# Patient Record
Sex: Male | Born: 1975 | Race: Black or African American | Hispanic: No | Marital: Married | Smoking: Current some day smoker
Health system: Southern US, Community
[De-identification: ages and names within clinical notes are randomized; demographics above are authoritative.]

## PROBLEM LIST (undated history)

## (undated) DIAGNOSIS — N189 Chronic kidney disease, unspecified: Secondary | ICD-10-CM

## (undated) DIAGNOSIS — B20 Human immunodeficiency virus [HIV] disease: Secondary | ICD-10-CM

## (undated) DIAGNOSIS — Z21 Asymptomatic human immunodeficiency virus [HIV] infection status: Secondary | ICD-10-CM

## (undated) DIAGNOSIS — R519 Headache, unspecified: Secondary | ICD-10-CM

## (undated) DIAGNOSIS — I10 Essential (primary) hypertension: Secondary | ICD-10-CM

## (undated) DIAGNOSIS — A539 Syphilis, unspecified: Secondary | ICD-10-CM

## (undated) DIAGNOSIS — H547 Unspecified visual loss: Secondary | ICD-10-CM

## (undated) DIAGNOSIS — R569 Unspecified convulsions: Secondary | ICD-10-CM

## (undated) DIAGNOSIS — Z972 Presence of dental prosthetic device (complete) (partial): Secondary | ICD-10-CM

## (undated) HISTORY — PX: NO PAST SURGERIES: SHX2092

## (undated) HISTORY — PX: MULTIPLE TOOTH EXTRACTIONS: SHX2053

## (undated) HISTORY — DX: Unspecified visual loss: H54.7

---

## 2010-11-06 DIAGNOSIS — B20 Human immunodeficiency virus [HIV] disease: Secondary | ICD-10-CM

## 2010-11-06 DIAGNOSIS — R200 Anesthesia of skin: Secondary | ICD-10-CM

## 2010-11-06 DIAGNOSIS — R202 Paresthesia of skin: Secondary | ICD-10-CM

## 2010-11-06 HISTORY — DX: Human immunodeficiency virus (HIV) disease: B20

## 2010-11-06 HISTORY — DX: Paresthesia of skin: R20.0

## 2011-04-09 DIAGNOSIS — B027 Disseminated zoster: Secondary | ICD-10-CM | POA: Insufficient documentation

## 2011-04-09 HISTORY — DX: Disseminated zoster: B02.7

## 2014-03-02 DIAGNOSIS — R29898 Other symptoms and signs involving the musculoskeletal system: Secondary | ICD-10-CM | POA: Insufficient documentation

## 2014-03-02 HISTORY — DX: Other symptoms and signs involving the musculoskeletal system: R29.898

## 2014-03-19 DIAGNOSIS — R110 Nausea: Secondary | ICD-10-CM | POA: Insufficient documentation

## 2014-06-07 DIAGNOSIS — G540 Brachial plexus disorders: Secondary | ICD-10-CM

## 2014-06-07 HISTORY — DX: Brachial plexus disorders: G54.0

## 2015-06-10 DIAGNOSIS — S0990XA Unspecified injury of head, initial encounter: Secondary | ICD-10-CM

## 2015-06-10 DIAGNOSIS — H9192 Unspecified hearing loss, left ear: Secondary | ICD-10-CM

## 2015-06-10 DIAGNOSIS — R569 Unspecified convulsions: Secondary | ICD-10-CM

## 2015-06-10 HISTORY — DX: Unspecified hearing loss, left ear: H91.92

## 2015-06-10 HISTORY — DX: Unspecified convulsions: R56.9

## 2015-06-10 HISTORY — DX: Unspecified injury of head, initial encounter: S09.90XA

## 2015-12-05 DIAGNOSIS — R062 Wheezing: Secondary | ICD-10-CM | POA: Insufficient documentation

## 2015-12-05 DIAGNOSIS — J392 Other diseases of pharynx: Secondary | ICD-10-CM | POA: Insufficient documentation

## 2017-01-30 ENCOUNTER — Encounter (HOSPITAL_COMMUNITY): Payer: Self-pay

## 2017-01-30 ENCOUNTER — Emergency Department (HOSPITAL_COMMUNITY)
Admission: EM | Admit: 2017-01-30 | Discharge: 2017-01-30 | Disposition: A | Discharge status: Home / Self Care | Payer: Medicaid Other | Attending: Emergency Medicine | Admitting: Emergency Medicine

## 2017-01-30 DIAGNOSIS — B20 Human immunodeficiency virus [HIV] disease: Secondary | ICD-10-CM

## 2017-01-30 DIAGNOSIS — G40909 Epilepsy, unspecified, not intractable, without status epilepticus: Secondary | ICD-10-CM | POA: Diagnosis not present

## 2017-01-30 DIAGNOSIS — R569 Unspecified convulsions: Secondary | ICD-10-CM | POA: Diagnosis present

## 2017-01-30 DIAGNOSIS — F17210 Nicotine dependence, cigarettes, uncomplicated: Secondary | ICD-10-CM | POA: Diagnosis not present

## 2017-01-30 HISTORY — DX: Asymptomatic human immunodeficiency virus (hiv) infection status: Z21

## 2017-01-30 HISTORY — DX: Human immunodeficiency virus (HIV) disease: B20

## 2017-01-30 HISTORY — DX: Unspecified convulsions: R56.9

## 2017-01-30 LAB — VALPROIC ACID LEVEL: VALPROIC ACID LVL: 104 ug/mL — AB (ref 50.0–100.0)

## 2017-01-30 MED ORDER — LACOSAMIDE 100 MG PO TABS: 100.0000 mg | ORAL_TABLET | Freq: Two times a day (BID) | ORAL | 0 refills | Status: DC

## 2017-01-30 MED ORDER — LACOSAMIDE 200 MG PO TABS
200.0000 mg | ORAL_TABLET | Freq: Once | ORAL | Status: DC
  Filled 2017-01-30: qty 1

## 2017-01-30 NOTE — ED Notes (Signed)
Pt refuses gown States "I don't do hospitals or gowns"

## 2017-01-30 NOTE — ED Triage Notes (Signed)
Onset last night pt has had several seizures.  Pt does not remember these seizures.  Pt reports last was several days prior to these seizures and before that it was several weeks ago.  Pt takes Divalproex 500 mg tab 2 tabs in am, 1 tab in afternoon, 2 tabs at HS.  Has not missed any meds.  Pt just moved to [place] from [place].

## 2017-01-30 NOTE — Discharge Instructions (Addendum)
Continue taking your current dose of Depakote.

## 2017-01-30 NOTE — ED Notes (Signed)
Pt very anxious to leave the hospital, not cooperative with care, dc instructions and prescriptions given to the pt, pt refuses to listen to instructions and states he just want to leave, pt oriented that we are only waiting for his medicine to come from pharmacy multiple messages sent to pharmacy medication still not available pt encouraged to wait for prescriptions.

## 2017-01-30 NOTE — ED Provider Notes (Signed)
[facility]EMERGENCY DEPT Provider Note   CSN: [id number] Arrival date & time: 01/30/17  2112     History   Chief Complaint Chief Complaint  Patient presents with  . Seizures    HPI Perry Norton is a 41 y.o. male.  The history is provided by the patient.  Seizures    He has a history of seizure disorder for which he takes Depakote 2500 mg a day. He claims medication compliance. Last night, he had several seizures while in his sleep, and an additional one this afternoon. [place] moved here from [place]. He had called his HIV doctor recommended he come to the ED. He is back to his baseline. He is also followed for HIV disease.  Past Medical History:  Diagnosis Date  . HIV (human immunodeficiency virus infection) (HCC)   . Seizures (HCC)    epilepsy    There are no active problems to display for this patient.   History reviewed. No pertinent surgical history.     Home Medications    Prior to Admission medications   Not on File    Family History History reviewed. No pertinent family history.  Social History Social History  Substance Use Topics  . Smoking status: Current Some Day Smoker    Types: Cigarettes  . Smokeless tobacco: Never Used  . Alcohol use Yes     Comment: occ     Allergies   Patient has no known allergies.   Review of Systems Review of Systems  Neurological: Positive for seizures.  All other systems reviewed and are negative.    Physical Exam Updated Vital Signs BP (!) 142/103   Pulse 93   Temp 98.5 F (36.9 C) (Oral)   Resp 16   SpO2 100%   Physical Exam  Nursing note and vitals reviewed.  41 year old male, resting comfortably and in no acute distress. Vital signs are significant for hypertension. Oxygen saturation is 100%, which is normal. Head is normocephalic and atraumatic. PERRLA, EOMI. Oropharynx is clear. Neck is nontender and supple without adenopathy or JVD. Back is nontender and there is no CVA tenderness. Lungs are  clear without rales, wheezes, or rhonchi. Chest is nontender. Heart has regular rate and rhythm without murmur. Abdomen is soft, flat, nontender without masses or hepatosplenomegaly and peristalsis is normoactive. Extremities have no cyanosis or edema, full range of motion is present. Skin is warm and dry without rash. Neurologic: Mental status is normal, cranial nerves are intact, there are no motor or sensory deficits.  ED Treatments / Results  Labs (all labs ordered are listed, but only abnormal results are displayed) Labs Reviewed  VALPROIC ACID LEVEL - Abnormal; Notable for the following:       Result Value   Valproic Acid Lvl 104 (*)    All other components within normal limits   Procedures Procedures (including critical care time)  Medications Ordered in ED Medications  lacosamide (VIMPAT) tablet 200 mg (not administered)     Initial Impression / Assessment and Plan / ED Course  I have reviewed the triage vital signs and the nursing notes.  Pertinent lab results that were available during my care of the patient were reviewed by me and considered in my medical decision making (see chart for details).  Seizures with therapeutic anticonvulsant level. I have access to his records from [place] through care everywhere. His valproic acid dose had been gradually increased. Level today is very slightly supratherapeutic. He had been on levetiracetam, but it was  discontinued because of behavioral side effects. Last HIV studies in March had shown nondetectable viral load and normal CD4 count. I do not see an indication for imaging tonight. I have discussed the case with Dr. Wilford Corner of neurology service who recommends starting him on Vimpat. Initial doses given in the emergency department, and he is sent home with a prescription for same. He is given resources to establish care for neurology and infectious disease.  Final Clinical Impressions(s) / ED Diagnoses   Final diagnoses:  Seizure  (HCC)  HIV disease (HCC)    New Prescriptions New Prescriptions   LACOSAMIDE (VIMPAT) 100 MG TABS    Take 1 tablet (100 mg total) by mouth 2 (two) times daily.     Dione Booze, MD 01/30/17 [phone]

## 2017-02-01 ENCOUNTER — Telehealth: Payer: Self-pay | Admitting: Infectious Disease

## 2017-02-01 NOTE — Telephone Encounter (Signed)
Patient was seen in the ER for seizures. He has known HIV that has been well-controlled. He is however seeking to establish care here in [place] and is highly motivated. I was called by the ER staff regarding this patient and told them that I would 40 is information to our clinical staff so we get him into our clinic as we can.

## 2017-04-28 DIAGNOSIS — M542 Cervicalgia: Secondary | ICD-10-CM | POA: Diagnosis not present

## 2017-04-28 DIAGNOSIS — Z0001 Encounter for general adult medical examination with abnormal findings: Secondary | ICD-10-CM | POA: Diagnosis not present

## 2017-04-28 DIAGNOSIS — B20 Human immunodeficiency virus [HIV] disease: Secondary | ICD-10-CM | POA: Diagnosis not present

## 2017-04-28 DIAGNOSIS — G40909 Epilepsy, unspecified, not intractable, without status epilepticus: Secondary | ICD-10-CM | POA: Diagnosis not present

## 2017-05-03 ENCOUNTER — Emergency Department (HOSPITAL_COMMUNITY): Payer: Medicaid Other

## 2017-05-03 ENCOUNTER — Emergency Department (HOSPITAL_COMMUNITY)
Admission: EM | Admit: 2017-05-03 | Discharge: 2017-05-03 | Disposition: A | Discharge status: Home / Self Care | Payer: Medicaid Other | Attending: Emergency Medicine | Admitting: Emergency Medicine

## 2017-05-03 ENCOUNTER — Encounter (HOSPITAL_COMMUNITY): Payer: Self-pay | Admitting: *Deleted

## 2017-05-03 ENCOUNTER — Other Ambulatory Visit: Payer: Self-pay

## 2017-05-03 DIAGNOSIS — S43102A Unspecified dislocation of left acromioclavicular joint, initial encounter: Secondary | ICD-10-CM | POA: Insufficient documentation

## 2017-05-03 DIAGNOSIS — W06XXXA Fall from bed, initial encounter: Secondary | ICD-10-CM | POA: Insufficient documentation

## 2017-05-03 DIAGNOSIS — F17210 Nicotine dependence, cigarettes, uncomplicated: Secondary | ICD-10-CM | POA: Diagnosis not present

## 2017-05-03 DIAGNOSIS — Y939 Activity, unspecified: Secondary | ICD-10-CM | POA: Diagnosis not present

## 2017-05-03 DIAGNOSIS — R569 Unspecified convulsions: Secondary | ICD-10-CM | POA: Insufficient documentation

## 2017-05-03 DIAGNOSIS — Y999 Unspecified external cause status: Secondary | ICD-10-CM | POA: Insufficient documentation

## 2017-05-03 DIAGNOSIS — B20 Human immunodeficiency virus [HIV] disease: Secondary | ICD-10-CM | POA: Insufficient documentation

## 2017-05-03 DIAGNOSIS — Y929 Unspecified place or not applicable: Secondary | ICD-10-CM | POA: Diagnosis not present

## 2017-05-03 LAB — CBC WITH DIFFERENTIAL/PLATELET
BASOS PCT: 1 %
Basophils Absolute: 0 10*3/uL (ref 0.0–0.1)
EOS ABS: 0.1 10*3/uL (ref 0.0–0.7)
Eosinophils Relative: 1 %
HEMATOCRIT: 43.3 % (ref 39.0–52.0)
Hemoglobin: 14.4 g/dL (ref 13.0–17.0)
LYMPHS ABS: 2.1 10*3/uL (ref 0.7–4.0)
Lymphocytes Relative: 41 %
MCH: 29.8 pg (ref 26.0–34.0)
MCHC: 33.3 g/dL (ref 30.0–36.0)
MCV: 89.6 fL (ref 78.0–100.0)
MONO ABS: 0.4 10*3/uL (ref 0.1–1.0)
MONOS PCT: 8 %
Neutro Abs: 2.5 10*3/uL (ref 1.7–7.7)
Neutrophils Relative %: 49 %
Platelets: 185 10*3/uL (ref 150–400)
RBC: 4.83 MIL/uL (ref 4.22–5.81)
RDW: 12.8 % (ref 11.5–15.5)
WBC: 5.2 10*3/uL (ref 4.0–10.5)

## 2017-05-03 LAB — COMPREHENSIVE METABOLIC PANEL
ALBUMIN: 4.4 g/dL (ref 3.5–5.0)
ALT: 18 U/L (ref 17–63)
ANION GAP: 8 (ref 5–15)
AST: 20 U/L (ref 15–41)
Alkaline Phosphatase: 66 U/L (ref 38–126)
BILIRUBIN TOTAL: 0.5 mg/dL (ref 0.3–1.2)
BUN: 10 mg/dL (ref 6–20)
CO2: 28 mmol/L (ref 22–32)
Calcium: 9.3 mg/dL (ref 8.9–10.3)
Chloride: 103 mmol/L (ref 101–111)
Creatinine, Ser: 1.43 mg/dL — ABNORMAL HIGH (ref 0.61–1.24)
GFR calc non Af Amer: >60 mL/min (ref >60)
GLUCOSE: 118 mg/dL — AB (ref 65–99)
POTASSIUM: 3.8 mmol/L (ref 3.5–5.1)
SODIUM: 139 mmol/L (ref 135–145)
TOTAL PROTEIN: 7.4 g/dL (ref 6.5–8.1)

## 2017-05-03 LAB — VALPROIC ACID LEVEL: Valproic Acid Lvl: <10 ug/mL — ABNORMAL LOW (ref 50.0–100.0)

## 2017-05-03 MED ORDER — HYDROCODONE-ACETAMINOPHEN 5-325 MG PO TABS
1.0000 | ORAL_TABLET | Freq: Once | ORAL | Status: AC
  Administered 2017-05-03: 1 via ORAL
  Filled 2017-05-03: qty 1

## 2017-05-03 MED ORDER — DIVALPROEX SODIUM 250 MG PO DR TAB
1000.0000 mg | DELAYED_RELEASE_TABLET | Freq: Once | ORAL | Status: AC
  Administered 2017-05-03: 1000 mg via ORAL
  Filled 2017-05-03: qty 4

## 2017-05-03 NOTE — Progress Notes (Signed)
Orthopedic Tech Progress Note Patient Details:  Perry Norton [birth year] [id number]  Ortho Devices Type of Ortho Device: Sling immobilizer Ortho Device/Splint Interventions: Application   Saul FordyceJennifer C Ellie Bryand 05/03/2017, 5:19 PM

## 2017-05-03 NOTE — ED Provider Notes (Signed)
[facility] Provider Note   CSN: [id number] Arrival date & time: 05/03/17  1416     History   Chief Complaint Chief Complaint  Patient presents with  . Seizures    HPI Perry Norton is a 41 y.o. male.  41 year old male with history of seizure disorder and HIV presents complaining of left shoulder pain following seizures. Patient appears alert and comfortable at time of my evaluation. He reports that he fell off his bed (about 3 feet) onto the floor. He landed on his left shoulder. He complains of left shoulder pain, left elbow pain, and left wrist pain. He denies head injury.  He reports that he has not taken his depakote for several weeks (he has never taken vimpat). He reports that he has depakote at the pharmacy - he just has to "pick it up."   He declines IV, IM meds, and/or CT head. He is alert and comfortable and has capacity to refuse care.    The history is provided by the patient.  Seizures   This is a recurrent problem. The current episode started 6 to 12 hours ago. The problem has been gradually improving. There were 6 to 10 seizures. Duration: uncertain. uncertain The episode was not witnessed. There was no sensation of an aura present. Possible causes include missed seizure meds. There has been no fever.    Past Medical History:  Diagnosis Date  . HIV (human immunodeficiency virus infection) (HCC)   . Seizures (HCC)    epilepsy    There are no active problems to display for this patient.   History reviewed. No pertinent surgical history.     Home Medications    Prior to Admission medications   Medication Sig Start Date End Date Taking? Authorizing Provider  Lacosamide (VIMPAT) 100 MG TABS Take 1 tablet (100 mg total) by mouth 2 (two) times daily. Patient not taking: Reported on 05/03/2017 01/30/17   Dione BoozeGlick, David, MD    Family History No family history on file.  Social History Social History   Tobacco Use  .  Smoking status: Current Some Day Smoker    Types: Cigarettes  . Smokeless tobacco: Never Used  Substance Use Topics  . Alcohol use: Yes    Comment: occ  . Drug use: Yes    Types: Marijuana    Comment: denies use 05/03/17     Allergies   Onion   Review of Systems Review of Systems  Musculoskeletal:       Left shoulder pain   Neurological: Positive for seizures.  All other systems reviewed and are negative.    Physical Exam Updated Vital Signs BP (!) 155/80 (BP Location: Right Arm)   Pulse 75   Temp 98.4 F (36.9 C) (Oral)   Resp 14   Ht 6' (1.829 m)   Wt 80.7 kg (178 lb)   SpO2 100%   BMI 24.14 kg/m   Physical Exam  Constitutional: He appears well-developed and well-nourished.  HENT:  Head: Normocephalic and atraumatic.  Mouth/Throat: Oropharynx is clear and moist.  Eyes: Conjunctivae and EOM are normal. Pupils are equal, round, and reactive to light.  Neck: Normal range of motion. Neck supple.  Cardiovascular: Normal rate, regular rhythm and normal heart sounds.  No murmur heard. Pulmonary/Chest: Effort normal and breath sounds normal. No respiratory distress.  Abdominal: Soft. Bowel sounds are normal. He exhibits no distension. There is no tenderness.  Musculoskeletal: Normal range of motion. He exhibits no edema.  Mild tenderness to left shoulder, left elbow, and left wrist - no appreciable deformity, swelling, or contusion  Neurological: He is alert.  Skin: Skin is warm and dry.  Psychiatric: He has a normal mood and affect.  Nursing note and vitals reviewed.    ED Treatments / Results  Labs (all labs ordered are listed, but only abnormal results are displayed) Labs Reviewed  VALPROIC ACID LEVEL - Abnormal; Notable for the following components:      Result Value   Valproic Acid Lvl <10 (*)    All other components within normal limits  COMPREHENSIVE METABOLIC PANEL - Abnormal; Notable for the following components:   Glucose, Bld 118 (*)     Creatinine, Ser 1.43 (*)    All other components within normal limits  CBC WITH DIFFERENTIAL/PLATELET    EKG  EKG Interpretation None       Radiology Dg Shoulder Left  Result Date: 05/03/2017 CLINICAL DATA:  History of seizures, unwitnessed seizure, minimally responsive. LEFT shoulder pain. EXAM: LEFT SHOULDER - 2+ VIEW COMPARISON:  None. FINDINGS: There is a moderate AC separation, possible grade 2 or 3. There is no glenohumeral fracture or dislocation. The clavicle is incompletely visualized, but appears grossly intact. IMPRESSION: Moderate AC separation, possible grade 2 or 3. Electronically Signed   By: Elsie StainJohn T Curnes M.D.   On: 05/03/2017 15:23    Procedures Procedures (including critical care time)  Medications Ordered in ED Medications  HYDROcodone-acetaminophen (NORCO/VICODIN) 5-325 MG per tablet 1 tablet (not administered)  divalproex (DEPAKOTE) DR tablet 1,000 mg (not administered)     Initial Impression / Assessment and Plan / ED Course  I have reviewed the triage vital signs and the nursing notes.  Pertinent labs & imaging results that were available during my care of the patient were reviewed by me and considered in my medical decision making (see chart for details).     MSE complete.  Patient with probable seizure secondary to noncompliance.  Patient appears quite comfortable during my evaluation.  There is no evidence of significant traumatic injury.  The patient was somewhat uncooperative with a more thorough workup in the ED.  Patient is aware of the need for close follow-up.  Patient reports to me that he will go and get his prescription for Depakote picked up from the pharmacy today.  Close follow-up is advised.  Strict return precautions are given and understood.  Final Clinical Impressions(s) / ED Diagnoses   Final diagnoses:  Seizure [facility](HCC)  Separation of left acromioclavicular joint, initial encounter    ED Discharge Orders    None         Wynetta FinesMessick, Peter C, MD 05/03/17 1745

## 2017-05-03 NOTE — ED Triage Notes (Addendum)
Pt in c/o hx of seizures with more than 6 in the last 24 hours, pt reports having an unwitnessed seizure today while laying in bed and fell out of bed, pt here now c/o L shoulder pain, pt states, "I feel like the bone is sticking up." A&O x4, ambulatory, pt guarding L arm

## 2017-05-03 NOTE — ED Notes (Signed)
Patient transported to X-ray 

## 2017-05-03 NOTE — ED Notes (Signed)
Pt. Is refusing to be placed in a wheel chair to go to x-ray. He stated he was not sitting in a wheel chair and was walking. Pt advised other wise but refused.

## 2017-05-03 NOTE — ED Notes (Addendum)
Pt is refusing to be hooked to any monitors and refusing to lay in the bed

## 2017-05-04 ENCOUNTER — Telehealth: Payer: Self-pay

## 2017-05-04 NOTE — Telephone Encounter (Signed)
Patient is calling to establish with [facility].  He is a new transfer and has been seen at [facility] where he was given a 30 day supply of Stribild.   He was advised to come to the office to sign a medical record release , given lab appointment and office visit.   Laurell Josephsammy K. King, RN

## 2017-05-05 ENCOUNTER — Encounter: Payer: Self-pay | Admitting: Neurology

## 2017-05-12 ENCOUNTER — Ambulatory Visit: Payer: PRIVATE HEALTH INSURANCE

## 2017-05-12 ENCOUNTER — Telehealth: Payer: Self-pay

## 2017-05-12 ENCOUNTER — Other Ambulatory Visit: Payer: Medicaid Other

## 2017-05-12 ENCOUNTER — Other Ambulatory Visit (HOSPITAL_COMMUNITY)
Admission: RE | Admit: 2017-05-12 | Discharge: 2017-05-12 | Disposition: A | Discharge status: Home / Self Care | Payer: PRIVATE HEALTH INSURANCE | Source: Ambulatory Visit | Attending: Infectious Diseases | Admitting: Infectious Diseases

## 2017-05-12 ENCOUNTER — Encounter: Payer: Self-pay | Admitting: *Deleted

## 2017-05-12 DIAGNOSIS — Z113 Encounter for screening for infections with a predominantly sexual mode of transmission: Secondary | ICD-10-CM | POA: Insufficient documentation

## 2017-05-12 DIAGNOSIS — B20 Human immunodeficiency virus [HIV] disease: Secondary | ICD-10-CM

## 2017-05-12 LAB — CBC WITH DIFFERENTIAL/PLATELET
BASOS ABS: 39 {cells}/uL (ref 0–200)
Basophils Relative: 0.7 %
EOS PCT: 1.4 %
Eosinophils Absolute: 78 cells/uL (ref 15–500)
HEMATOCRIT: 40.4 % (ref 38.5–50.0)
HEMOGLOBIN: 13.6 g/dL (ref 13.2–17.1)
LYMPHS ABS: 2078 {cells}/uL (ref 850–3900)
MCH: 29.7 pg (ref 27.0–33.0)
MCHC: 33.7 g/dL (ref 32.0–36.0)
MCV: 88.2 fL (ref 80.0–100.0)
MPV: 11.7 fL (ref 7.5–12.5)
Monocytes Relative: 8.9 %
NEUTROS ABS: 2906 {cells}/uL (ref 1500–7800)
Neutrophils Relative %: 51.9 %
Platelets: 180 10*3/uL (ref 140–400)
RBC: 4.58 10*6/uL (ref 4.20–5.80)
RDW: 12.7 % (ref 11.0–15.0)
Total Lymphocyte: 37.1 %
WBC: 5.6 10*3/uL (ref 3.8–10.8)
WBCMIX: 498 {cells}/uL (ref 200–950)

## 2017-05-12 LAB — COMPLETE METABOLIC PANEL WITH GFR
AG Ratio: 1.7 (calc) (ref 1.0–2.5)
ALBUMIN MSPROF: 4.3 g/dL (ref 3.6–5.1)
ALT: 25 U/L (ref 9–46)
AST: 25 U/L (ref 10–40)
Alkaline phosphatase (APISO): 58 U/L (ref 40–115)
BILIRUBIN TOTAL: 0.4 mg/dL (ref 0.2–1.2)
BUN/Creatinine Ratio: 5 (calc) — ABNORMAL LOW (ref 6–22)
BUN: 8 mg/dL (ref 7–25)
CHLORIDE: 103 mmol/L (ref 98–110)
CO2: 30 mmol/L (ref 20–32)
CREATININE: 1.52 mg/dL — AB (ref 0.60–1.35)
Calcium: 9.2 mg/dL (ref 8.6–10.3)
GFR, EST AFRICAN AMERICAN: 66 mL/min/{1.73_m2} (ref >=60)
GFR, Est Non African American: 57 mL/min/{1.73_m2} — ABNORMAL LOW (ref >=60)
GLUCOSE: 96 mg/dL (ref 65–99)
Globulin: 2.5 g/dL (calc) (ref 1.9–3.7)
Potassium: 4.3 mmol/L (ref 3.5–5.3)
Sodium: 140 mmol/L (ref 135–146)
TOTAL PROTEIN: 6.8 g/dL (ref 6.1–8.1)

## 2017-05-12 NOTE — Telephone Encounter (Signed)
Patient here for labs prior to New transfer HIV appointment.   Phlebotomist mention to me he was acting strange. I went out to introduce myself to the patient and make sure his intake process was going smoothly.  He was texting on his phone and did not bother to look up when I introduced myself.  I continued to speak with him and finally asked if he was ok.  When he lifted his head he had a strange stare and seem a little disoriented.  I asked if he was ok and he spoke with a slurred voice he was fine. I asked if he was sure and if there was anything I could do.  He stated in a rude tone" I am fine".  I continued to explain the process and he never lifted his head or acknowledged I was speaking.  I was still concerned about his slurred speech and his rude or awkward demeanor. I asked the scheduler and the financial counsel about their interactions with him and they stated he was fine.   Still concerned I check his medical records and noted he was diagnosed with seizure disorder and my concern grew larger.  I attempted to engage with the patient again and he became irate.   After his blood draw he was leaving the lab and fell to his knees at the exit. The financial counselor ran to find me and I attempted to reach for the patient and he flung his arms out as not to touch him.  He stood up and was very disoriented and his gait was off.  I asked to help him and he said no shaking his head.  He walked into the waiting area and I asked that he at least sit down for a few minutes until he was no longer dizzy and he shook his head no and left the office.  I followed him because I was sure he would fall again, he struggled to walk straight and stumbled through the parking deck. He continued to look back to see if I was still following him.  He disappeared in the parking deck. I am not sure if he was driving or walking.   Laurell Josephsammy K King, RN

## 2017-05-14 LAB — RPR TITER

## 2017-05-14 LAB — HEPATITIS C ANTIBODY
Hepatitis C Ab: NONREACTIVE
SIGNAL TO CUT-OFF: 0.02 (ref <1.00)

## 2017-05-14 LAB — RPR: RPR: REACTIVE — AB

## 2017-05-14 LAB — LIPID PANEL
Cholesterol: 197 mg/dL (ref <200)
HDL: 56 mg/dL (ref >40)
LDL Cholesterol (Calc): 119 mg/dL (calc) — ABNORMAL HIGH
Non-HDL Cholesterol (Calc): 141 mg/dL (calc) — ABNORMAL HIGH (ref <130)
TRIGLYCERIDES: 112 mg/dL (ref <150)
Total CHOL/HDL Ratio: 3.5 (calc) (ref <5.0)

## 2017-05-14 LAB — HEPATITIS B SURFACE ANTIBODY,QUALITATIVE: HEP B S AB: NONREACTIVE

## 2017-05-14 LAB — T-HELPER CELL (CD4) - (RCID CLINIC ONLY)
CD4 % Helper T Cell: 24 % — ABNORMAL LOW (ref 33–55)
CD4 T Cell Abs: 520 /uL (ref 400–2700)

## 2017-05-14 LAB — HEPATITIS B CORE ANTIBODY, TOTAL: Hep B Core Total Ab: NONREACTIVE

## 2017-05-14 LAB — URINE CYTOLOGY ANCILLARY ONLY
Chlamydia: NEGATIVE
Neisseria Gonorrhea: NEGATIVE

## 2017-05-14 LAB — HEPATITIS B SURFACE ANTIGEN: HEP B S AG: NONREACTIVE

## 2017-05-14 LAB — FLUORESCENT TREPONEMAL AB(FTA)-IGG-BLD: FLUORESCENT TREPONEMAL ABS: REACTIVE — AB

## 2017-05-14 LAB — HEPATITIS A ANTIBODY, TOTAL: HEPATITIS A AB,TOTAL: NONREACTIVE

## 2017-05-16 LAB — HIV-1 RNA ULTRAQUANT REFLEX TO GENTYP+: HIV-1 RNA Quant, Log: <1.3 Log cps/mL — ABNORMAL HIGH

## 2017-05-17 ENCOUNTER — Other Ambulatory Visit: Payer: Self-pay | Admitting: *Deleted

## 2017-05-17 ENCOUNTER — Telehealth: Payer: Self-pay | Admitting: *Deleted

## 2017-05-17 ENCOUNTER — Ambulatory Visit: Payer: Medicaid Other | Attending: Internal Medicine | Admitting: Physician Assistant

## 2017-05-17 ENCOUNTER — Other Ambulatory Visit: Payer: Self-pay

## 2017-05-17 VITALS — BP 167/110 | Temp 98.7°F | Resp 12 | Ht 72.0 in | Wt 189.0 lb

## 2017-05-17 DIAGNOSIS — B20 Human immunodeficiency virus [HIV] disease: Secondary | ICD-10-CM

## 2017-05-17 DIAGNOSIS — I10 Essential (primary) hypertension: Secondary | ICD-10-CM | POA: Insufficient documentation

## 2017-05-17 DIAGNOSIS — G40909 Epilepsy, unspecified, not intractable, without status epilepticus: Secondary | ICD-10-CM | POA: Insufficient documentation

## 2017-05-17 DIAGNOSIS — F17200 Nicotine dependence, unspecified, uncomplicated: Secondary | ICD-10-CM | POA: Diagnosis not present

## 2017-05-17 DIAGNOSIS — X58XXXA Exposure to other specified factors, initial encounter: Secondary | ICD-10-CM | POA: Diagnosis not present

## 2017-05-17 DIAGNOSIS — S43102A Unspecified dislocation of left acromioclavicular joint, initial encounter: Secondary | ICD-10-CM

## 2017-05-17 DIAGNOSIS — Z21 Asymptomatic human immunodeficiency virus [HIV] infection status: Secondary | ICD-10-CM | POA: Insufficient documentation

## 2017-05-17 DIAGNOSIS — R030 Elevated blood-pressure reading, without diagnosis of hypertension: Secondary | ICD-10-CM

## 2017-05-17 DIAGNOSIS — Z5329 Procedure and treatment not carried out because of patient's decision for other reasons: Secondary | ICD-10-CM | POA: Diagnosis not present

## 2017-05-17 DIAGNOSIS — R569 Unspecified convulsions: Secondary | ICD-10-CM

## 2017-05-17 DIAGNOSIS — Z79899 Other long term (current) drug therapy: Secondary | ICD-10-CM | POA: Diagnosis not present

## 2017-05-17 DIAGNOSIS — A539 Syphilis, unspecified: Secondary | ICD-10-CM

## 2017-05-17 MED ORDER — EMTRICITABINE-TENOFOVIR DF 200-300 MG PO TABS: 1.0000 | ORAL_TABLET | Freq: Every day | ORAL | 1 refills | Status: DC

## 2017-05-17 MED ORDER — HYDROCODONE-ACETAMINOPHEN 10-325 MG PO TABS: 1.0000 | ORAL_TABLET | Freq: Three times a day (TID) | ORAL | 0 refills | Status: DC | PRN

## 2017-05-17 MED ORDER — DOXYCYCLINE HYCLATE 100 MG PO TABS: 100.0000 mg | ORAL_TABLET | Freq: Two times a day (BID) | ORAL | 0 refills | Status: DC

## 2017-05-17 MED ORDER — DIVALPROEX SODIUM 500 MG PO DR TAB: 1250.0000 mg | DELAYED_RELEASE_TABLET | Freq: Three times a day (TID) | ORAL | 1 refills | Status: DC

## 2017-05-17 MED ORDER — LACOSAMIDE 200 MG PO TABS: 200.0000 mg | ORAL_TABLET | Freq: Two times a day (BID) | ORAL | 1 refills | Status: DC

## 2017-05-17 NOTE — Telephone Encounter (Signed)
Positive syphilis bicillin x 2 per Dr. Ninetta LightsHatcher. Called and left patient a generic voice mail to return the call. Wendall MolaJacqueline Cockerham CMA

## 2017-05-17 NOTE — Progress Notes (Signed)
Per verbal order from Dr Ninetta LightsHatcher. Andree CossHowell, Bralin Garry M, RN

## 2017-05-17 NOTE — Progress Notes (Signed)
Pt here for f/u hospital visit.

## 2017-05-17 NOTE — Telephone Encounter (Signed)
Patient returning clinic call regarding lab results. He was frustrated, stating he has called several times over the last 2+ hours, keeps being put on long holds for [place], but cannot wait on hold.  RN apologized for the delay, asked how I could help.  RN relayed test results, asked patient if he has been recently treated for syphilis. He stated he had 3 rounds of injections years ago, has tested negative since then. RN relayed the message that Dr Ninetta LightsHatcher is advising 1 round of injections, 2.4 million units bicillin.  Patient requested to have pills instead, stating that his physician in [place] used to just give him that. RN advised that I would have to ask Dr Ninetta LightsHatcher, would call him back with the reply.  RN confirmed patient's upcoming appointment on 11/28 2:00 Wednesday.  Of note, [facility] saw [facility] to establish care. He told them he was out of HIV medication, so they sent in Tru[facility]ada alone to [facility] on [address].  Per pharmacy, he last got Stribild on 11/7, but has not yet filled Truvada.  RN asked the pharmacy to hold Truvada until Dr Ninetta LightsHatcher reviews this. Andree CossHowell, Lynnette Pote M, RN

## 2017-05-17 NOTE — Telephone Encounter (Signed)
If pt's hx of negative test in last 12 months is correct, we can give him tx as early.  Would give him PEN G 2.4 million units once. If he refuses, doxy 100mg  po bid for 14 days. This was checked via [facility] drug interaction with his other rx.

## 2017-05-17 NOTE — Progress Notes (Signed)
Perry Gingerric Kavanaugh  EAV:[id number]  NWG:[id number]  DOB - 03-21-1976  Chief Complaint  Patient presents with  . Follow-up    ED       Subjective:   Perry Norton is a 41 y.o. male here today for establishment of care. He has a history of smoking, syphilis, substance abuse, HIV virus and seizure disorder. He relatively recently moved here [place]. He presented to the emergency department on 05/03/2017 with left shoulder pain. It sounds like he had been off his seizure medications for weeks and may have had a seizure and fell off the bed. He landed on his left shoulder. His valproic acid level in the emergency department was less than 10. His creatinine was 1.4. He did allow and x-ray of the left shoulder which showed a moderate before meals separation, grade 2 or 3. He was placed in a sling. He would not allow for any seizure or pain medications in the emergency department. The ED doc recommended Vimpat with the Depakote upon DC.  He is pretty uncooperative today is well. His blood pressure is very high and I offered treatment with medications and to recheck his blood pressure manually which he declines. He would not shake my hand during the interview. He would not allow me to do a physical examination. He would talk to me and states that he's not had any further seizures. He denies chest pain or shortness of breath. He does have pain in the left shoulder and is requesting some medications if we have some. He is ok seeing a specialists about his shoulder.  He needs refills on SZ meds, HIV meds and would like a pain pill.    ROS: GEN: denies fever or chills, denies change in weight Skin: denies lesions or rashes HEENT: denies headache, earache, epistaxis, sore throat, or neck pain LUNGS: denies SHOB, dyspnea, PND, orthopnea EXT: denies muscle spasms or swelling; + pain in upper left ext, + weakness NEURO: denies numbness or tingling, denies sz, stroke or  TIA   ALLERGIES: Allergies  Allergen Reactions  . Onion Rash    PAST MEDICAL HISTORY: Past Medical History:  Diagnosis Date  . HIV (human immunodeficiency virus infection) (HCC)   . Seizures (HCC)    epilepsy    PAST SURGICAL HISTORY: No past surgical history on file.  MEDICATIONS AT HOME: Prior to Admission medications   Medication Sig Start Date End Date Taking? Authorizing Provider  divalproex (DEPAKOTE) 500 MG DR tablet Take 3 tablets (1,500 mg total) by mouth 3 (three) times daily. 05/17/17  Yes Sheena Simonis S, PA-C  emtricitabine-tenofovir (TRUVADA) 200-300 MG tablet Take 1 tablet by mouth daily. 05/17/17  Yes Danelle EarthlyNoel, [place] S, PA-C  lacosamide (VIMPAT) 200 MG TABS tablet Take 1 tablet (200 mg total) by mouth 2 (two) times daily. 05/17/17  Yes Danelle EarthlyNoel, Makeya Hilgert S, PA-C  HYDROcodone-acetaminophen (NORCO) 10-325 MG tablet Take 1 tablet by mouth every 8 (eight) hours as needed. 05/17/17   Vivianne MasterNoel, Lalena Salas S, PA-C  Lacosamide (VIMPAT) 100 MG TABS Take 1 tablet (100 mg total) by mouth 2 (two) times daily. Patient not taking: Reported on 05/03/2017 01/30/17   Dione BoozeGlick, David, MD   Family-he would not discuss  Social-married, children, recently moved [place] from [place]  Objective:   Vitals:   05/17/17 1009  BP: (!) 167/110  Resp: 12  Temp: 98.7 F (37.1 C)  TempSrc: Oral  SpO2: 99%  Weight: 189 lb (85.7 kg)  Height: 6' (1.829 m)  Exam General appearance : Awake, alert, not in any distress. Speech Clear. Not toxic looking HEENT: Atraumatic and Normocephalic, pupils equally reactive to light and accomodation Neck: supple, no JVD. No cervical lymphadenopathy.  Chest:Good air entry bilaterally, no added sounds  CVS: S1 S2 regular, no murmurs.  Abdomen: Bowel sounds present, Non tender and not distended with no guarding, rigidity or rebound. Extremities: B/L Lower Ext shows no edema, both legs are warm to touch Neurology: Awake alert, and oriented X 3, CN II-XII intact, Non  focal Skin:No Rash Wounds:N/A   Assessment & Plan  1. Seizure D/O  -refilled Vimpat and Depakote  -check level at next visit (if he will allow)  2. Moderate AC Separation  -ortho referral   -Norco  3. Elevated Blood Pressure  -he will not allow a manual check and declines meds  -low salt diet  4. Smoker  -not ready to quit  5. HIV+  -refilled meds  -[facility] appt pending   Return in about 4 weeks (around 06/14/2017).  The patient was given clear instructions to go to ER or return to medical center if symptoms don't improve, worsen or new problems develop. The patient verbalized understanding. The patient was told to call to get lab results if they haven't heard anything in the next week.    This note has been created with Education officer, environmentalDragon speech recognition software and smart phrase technology. Any transcriptional errors are unintentional.    Scot Juniffany Lincoln Kleiner, PA-C [facility] [place], [place] [phone]   05/17/2017, 10:48 AM

## 2017-05-17 NOTE — Telephone Encounter (Signed)
Patient refused bicillin injection, requested doxy. He also stated He has never cleared syphilis, that it is always in his blood results. He was shaking, holding his left arm to his abdomen. RN asked patient if he was ok, patient stated "I'm fine. Just pissed." Patient repeatedly refused to reply, refused help, pushed my hands away. RN followed him as he stumbled out into the lobby. He dropped to his knees at the reception desk. RN asked "Minerva Areolaric, will you let me help now?" Patient said "No, leave me alone. I'm just pissed." Rx sent. Please advise if he should have more than 14 day dose. Andree CossHowell, Michelle M, RN

## 2017-05-18 ENCOUNTER — Encounter: Payer: Self-pay | Admitting: Infectious Diseases

## 2017-05-18 NOTE — Telephone Encounter (Signed)
14 days correct thanks

## 2017-05-19 ENCOUNTER — Ambulatory Visit: Payer: PRIVATE HEALTH INSURANCE | Admitting: Infectious Diseases

## 2017-05-19 ENCOUNTER — Ambulatory Visit: Payer: PRIVATE HEALTH INSURANCE

## 2017-05-19 LAB — QUANTIFERON TB GOLD ASSAY (BLOOD)
QUANTIFERON NIL VALUE: 0.02 [IU]/mL
QUANTIFERON TB AG MINUS NIL: 0 [IU]/mL
QUANTIFERON(R)-TB GOLD: NEGATIVE

## 2017-05-19 LAB — HLA B*5701: HLA-B*5701 w/rflx HLA-B High: NEGATIVE

## 2017-05-20 ENCOUNTER — Ambulatory Visit: Payer: Medicaid Other | Admitting: Infectious Diseases

## 2017-05-20 ENCOUNTER — Ambulatory Visit (INDEPENDENT_AMBULATORY_CARE_PROVIDER_SITE_OTHER): Payer: Medicaid Other | Admitting: Pharmacist

## 2017-05-20 ENCOUNTER — Telehealth: Payer: Self-pay | Admitting: General Practice

## 2017-05-20 ENCOUNTER — Encounter: Payer: Self-pay | Admitting: Infectious Diseases

## 2017-05-20 VITALS — Temp 98.4°F

## 2017-05-20 DIAGNOSIS — B20 Human immunodeficiency virus [HIV] disease: Secondary | ICD-10-CM

## 2017-05-20 DIAGNOSIS — R569 Unspecified convulsions: Secondary | ICD-10-CM

## 2017-05-20 DIAGNOSIS — A539 Syphilis, unspecified: Secondary | ICD-10-CM | POA: Diagnosis not present

## 2017-05-20 DIAGNOSIS — N182 Chronic kidney disease, stage 2 (mild): Secondary | ICD-10-CM

## 2017-05-20 DIAGNOSIS — Z113 Encounter for screening for infections with a predominantly sexual mode of transmission: Secondary | ICD-10-CM

## 2017-05-20 DIAGNOSIS — N189 Chronic kidney disease, unspecified: Secondary | ICD-10-CM

## 2017-05-20 DIAGNOSIS — K089 Disorder of teeth and supporting structures, unspecified: Secondary | ICD-10-CM | POA: Diagnosis not present

## 2017-05-20 DIAGNOSIS — Z79899 Other long term (current) drug therapy: Secondary | ICD-10-CM | POA: Diagnosis not present

## 2017-05-20 HISTORY — DX: Disorder of teeth and supporting structures, unspecified: K08.9

## 2017-05-20 HISTORY — DX: Chronic kidney disease, unspecified: N18.9

## 2017-05-20 MED ORDER — ELVITEG-COBIC-EMTRICIT-TENOFAF 150-150-200-10 MG PO TABS: 1.0000 | ORAL_TABLET | Freq: Every day | ORAL | 3 refills | Status: DC

## 2017-05-20 NOTE — Assessment & Plan Note (Signed)
His Cr was 1.4 02-2016.  Slightly up here with last 2 blood draws.  hopefully change to genvoya will help.

## 2017-05-20 NOTE — Progress Notes (Signed)
   Subjective:    Patient ID: Perry Norton, male    DOB: November 20, 1975, 41 y.o.   MRN: [id number]  HPI 41 yo M with HIV+ since 2009, seizure d/o, previously followed in [place]. He is not clear of his risk factor.  He was [place] on [place] but had abn dreams.  Was previously taking stribild and truvada. He was also on seizure medication.  Last sz was last night- lasted a few minutes, LOC. Taking depakote and vimpat.  Has L shoulder in sling, after falling out of bed due to sz. He has neuro apt on 07-22-17.  HIV (-) wife. Has condoms. Will be tested again soon.   HIV 1 RNA Quant (Copies/mL)  Date Value  05/12/2017 <20 (H)   CD4 T Cell Abs (/uL)  Date Value  05/12/2017 520   The past medical history, family history and social history were reviewed/updated in EPIC   Review of Systems  Constitutional: Negative for appetite change, chills and fever.  HENT: Negative for mouth sores.   Respiratory: Negative for cough and shortness of breath.   Gastrointestinal: Negative for constipation and diarrhea.  Genitourinary: Negative for difficulty urinating and genital sores.  Skin: Positive for wound.  Psychiatric/Behavioral: Positive for agitation.  "knot on L cheek" Please see HPI. All other systems reviewed and negative.      Objective:   Physical Exam  Constitutional: He appears well-developed and well-nourished.  HENT:  Mouth/Throat: No oropharyngeal exudate.  Eyes: EOM are normal. Pupils are equal, round, and reactive to light.  Neck: Neck supple.  Cardiovascular: Normal rate, regular rhythm and normal heart sounds.  Pulmonary/Chest: Effort normal and breath sounds normal.  Abdominal: Soft. Bowel sounds are normal. There is no tenderness. There is no rebound.  Musculoskeletal: He exhibits no edema.  Lymphadenopathy:    He has no cervical adenopathy.  Psychiatric: His affect is angry.      Assessment & Plan:

## 2017-05-20 NOTE — Assessment & Plan Note (Signed)
He will f/u with Neuro on 06-2017.

## 2017-05-20 NOTE — Progress Notes (Signed)
HPI: Perry Norton is a 41 y.o. male who presents to the clinic today for transferring HIV care from [facility].   Allergies: Allergies  Allergen Reactions  . Onion Rash    Vitals: Temp: 98.4 F (36.9 C) (11/29 1420) Temp Source: Oral (11/29 1420)  Past Medical History: Past Medical History:  Diagnosis Date  . HIV (human immunodeficiency virus infection) (HCC)   . Seizures (HCC)    epilepsy    Social History: Social History   Socioeconomic History  . Marital status: Single    Spouse name: Not on file  . Number of children: Not on file  . Years of education: Not on file  . Highest education level: Not on file  Social Needs  . Financial resource strain: Not on file  . Food insecurity - worry: Not on file  . Food insecurity - inability: Not on file  . Transportation needs - medical: Not on file  . Transportation needs - non-medical: Not on file  Occupational History  . Not on file  Tobacco Use  . Smoking status: Current Some Day Smoker    Packs/day: 1.00    Types: Cigarettes    Start date: 06/22/1994  . Smokeless tobacco: Never Used  Substance and Sexual Activity  . Alcohol use: Yes    Alcohol/week: 1.8 oz    Types: 3 Cans of beer per week    Comment: occ  . Drug use: Yes    Types: Marijuana    Comment: denies use 05/03/17  . Sexual activity: Not on file  Other Topics Concern  . Not on file  Social History Narrative  . Not on file    Previous Regimen: Stribild/Truvada  Current Regimen: Genvoya  Labs: HIV 1 RNA Quant (Copies/mL)  Date Value  05/12/2017 <20 (H)   CD4 T Cell Abs (/uL)  Date Value  05/12/2017 520   Hep B S Ab (no units)  Date Value  05/12/2017 NON-REACTIVE   Hepatitis B Surface Ag (no units)  Date Value  05/12/2017 NON-REACTIVE    CrCl: Estimated Creatinine Clearance: 71.6 mL/min (A) (by C-G formula based on SCr of 1.52 mg/dL (H)).  Lipids:    Component Value Date/Time   CHOL 197 05/12/2017 1501   TRIG 112 05/12/2017  1501   HDL 56 05/12/2017 1501   CHOLHDL 3.5 05/12/2017 1501    Assessment: Attempted to see Perry Norton today to talk about his new medication, Genvoya and PrEP for his wife. Walked into the room and patient was very hostile. I introduced myself and Cassie as pharmacists and he stated that [facility]. I told him it would be but we just wanted to try and explain the medication and talk to him about it. He threw a hand in my face, said "Nah, I don't do that.", and walked out of the clinic. Pharmacy services refused.   Recommendations: - Take Genvoya daily   Della GooEmily S Sinclair, PharmD, BCPS PGY2 Infectious Diseases Pharmacy Resident  [facility] 05/20/2017, 2:49 PM

## 2017-05-20 NOTE — Assessment & Plan Note (Signed)
He was to f/u at health dept for shots.

## 2017-05-20 NOTE — Telephone Encounter (Signed)
Hydrocodone[place]acetaminophen [place]  Vimpat pending - it is not a preferred drug  Divalproex does not require prior authorization. Never does doxycycline.   His HIV medications are being managed by [facility]

## 2017-05-20 NOTE — Assessment & Plan Note (Signed)
States he has his own Education officer, communitydentist.

## 2017-05-20 NOTE — Assessment & Plan Note (Addendum)
He is very hostile He is on a duplicative rx- I explained this to him (he did not want me to explain this to him). Truvada is cancelled.  Will update stribild to genvoya.  Has condoms Wife will get tested, will have pharm see him about PREP for her.  Does not want vaccines.  Will see him back in 3- 4months  I offered to shake his hands at the end of the visit and he said "I don't shake hands...  I'm a born asshole... I'm not a people person"

## 2017-05-20 NOTE — Telephone Encounter (Signed)
Pt. Came in to facility stating that he was told that he needed prior authorization on all his medications that were prescribed on 11/26. Pt. Would like a call back once his medicationss have been approved. Please f/u

## 2017-05-21 ENCOUNTER — Ambulatory Visit (INDEPENDENT_AMBULATORY_CARE_PROVIDER_SITE_OTHER): Payer: Self-pay | Admitting: Orthopaedic Surgery

## 2017-05-24 ENCOUNTER — Encounter: Payer: Self-pay | Admitting: Licensed Clinical Social Worker

## 2017-05-25 ENCOUNTER — Ambulatory Visit (INDEPENDENT_AMBULATORY_CARE_PROVIDER_SITE_OTHER): Payer: Medicaid Other | Admitting: Orthopaedic Surgery

## 2017-05-25 ENCOUNTER — Encounter (INDEPENDENT_AMBULATORY_CARE_PROVIDER_SITE_OTHER): Payer: Self-pay | Admitting: Orthopaedic Surgery

## 2017-05-25 DIAGNOSIS — S43102A Unspecified dislocation of left acromioclavicular joint, initial encounter: Secondary | ICD-10-CM

## 2017-05-25 HISTORY — DX: Unspecified dislocation of left acromioclavicular joint, initial encounter: S43.102A

## 2017-05-25 MED ORDER — HYDROCODONE-ACETAMINOPHEN 5-325 MG PO TABS: 1.0000 | ORAL_TABLET | Freq: Three times a day (TID) | ORAL | 0 refills | Status: DC | PRN

## 2017-05-25 NOTE — Progress Notes (Signed)
Office Visit Note   Patient: Perry Norton           Date of Birth: [birth year]           MRN: [id number] Visit Date: 05/25/2017              Requested by: Vivianne MasterNoel, Tiffany S, PA-C [address] [place], [place] [id number] PCP: Patient, No Pcp Per   Assessment & Plan: Visit Diagnoses:  1. AC separation, type 2, left, initial encounter     Plan: Impression is left type II AC separation.  Sling for 2 weeks and then wean as tolerated.  Referral to physical therapy was given if needed.  Prescription for Norco.  Questions encouraged and answered.  Follow-up as needed.  Follow-Up Instructions: Return if symptoms worsen or fail to improve.   Orders:  No orders of the defined types were placed in this encounter.  Meds ordered this encounter  Medications  . HYDROcodone-acetaminophen (NORCO) 5-325 MG tablet    Sig: Take 1-2 tablets by mouth 3 (three) times daily as needed.    Dispense:  30 tablet    Refill:  0      Procedures: No procedures performed   Clinical Data: No additional findings.   Subjective: Chief Complaint  Patient presents with  . Left Shoulder - Pain     Patient is a 41 year old gentleman who sustained a fall as a result of a injure this weekend onto his left shoulder.  He sustained a type II separation.  He endorses severe pain.  He is currently wearing a sling.  Denies any numbness or tingling.  He does have a history of epilepsy.    Review of Systems  Constitutional: Negative.   All other systems reviewed and are negative.    Objective: Vital Signs: Ht 6' (1.829 m)   Wt 183 lb (83 kg)   BMI 24.82 kg/m    Physical Exam  Constitutional: He is oriented to person, place, and time. He appears well-developed and well-nourished.  HENT:  Head: Normocephalic and atraumatic.  Eyes: Pupils are equal, round, and reactive to light.  Neck: Neck supple.  Pulmonary/Chest: Effort normal.  Abdominal: Soft.  Musculoskeletal: Normal range of motion.    Neurological: He is alert and oriented to person, place, and time.  Skin: Skin is warm.  Psychiatric: He has a normal mood and affect. His behavior is normal. Judgment and thought content normal.  Nursing note and vitals reviewed.   Ortho Exam Left shoulder exam shows a tender AC joint with a prominent distal clavicle.  There is no skin tenting.  Neurovascular intact distally. Specialty Comments:  No specialty comments available.  Imaging: No results found.   PMFS History: Patient Active Problem List   Diagnosis Date Noted  . AC separation, type 2, left, initial encounter 05/25/2017  . CRI (chronic renal insufficiency) 05/20/2017  . Syphilis 05/20/2017  . Poor dentition 05/20/2017  . Pharyngeal irritation 12/05/2015  . Wheeze 12/05/2015  . Head trauma 06/10/2015  . 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 Seizure (HCC) 06/10/2015  . Brachial plexopathy 06/07/2014  . Nausea 03/19/2014  . Right arm weakness 03/02/2014  . Disseminated zoster 04/09/2011  . Human immunodeficiency virus (HIV) disease (HCC) 11/06/2010  . Numbness and tingling of right arm 11/06/2010   Past Medical History:  Diagnosis Date  . HIV (human immunodeficiency virus infection) (HCC)   . Seizures (HCC)    epilepsy    Family History  Problem Relation Age of  Onset  . Sarcoidosis Mother     No past surgical history on file. Social History   Occupational History  . Not on file  Tobacco Use  . Smoking status: Current Some Day Smoker    Packs/day: 1.00    Types: Cigarettes    Start date: 06/22/1994  . Smokeless tobacco: Never Used  Substance and Sexual Activity  . Alcohol use: Yes    Alcohol/week: 1.8 oz    Types: 3 Cans of beer per week    Comment: occ  . Drug use: Yes    Types: Marijuana    Comment: denies use 05/03/17  . Sexual activity: Not on file

## 2017-05-28 ENCOUNTER — Telehealth: Payer: Self-pay | Admitting: Pharmacist

## 2017-05-28 NOTE — Telephone Encounter (Signed)
[facility] [place] [facility] [place] [facility] [facility] that Vimpat would not be covered because he needs to try and fail other medications. This was reordered by Scot Juniffany Noel, PA-C, and originally ordered by the ED. Will forward to Dr. Hyman HopesJegede but patient likely needs to be seen and evaluated for a different medication to be prescribed since he has not established care here.

## 2017-06-03 ENCOUNTER — Telehealth: Payer: Self-pay | Admitting: *Deleted

## 2017-06-03 DIAGNOSIS — B20 Human immunodeficiency virus [HIV] disease: Secondary | ICD-10-CM

## 2017-06-03 MED ORDER — ELVITEG-COBIC-EMTRICIT-TENOFDF 150-150-200-300 MG PO TABS: 1.0000 | ORAL_TABLET | Freq: Every day | ORAL | 5 refills | Status: DC

## 2017-06-03 NOTE — Telephone Encounter (Signed)
Patient called, stating he was having bad dreams and did not like the way Genvoya made him feel. He wants to switch back to Stribild. RN sent prescription. Patient also wants to restart Truvada. RN explained Truvada is IN St[place], he cannot have prescriptions for both. Patient wants the Truvada for his wife. RN explained that we can see his wife for PREP, but cannot send in a prescription under his name for her to use, that it would be malpractice to prescribe anything for her when she is not our patient.  Patient very upset about this, asked several times for Truvada, would not take my answer.  He demanded to speak with Dr Ninetta LightsHatcher. RN advised Dr Ninetta LightsHatcher was not in the clinic today, offered to have him speak with Pharmacy. Patient refused.  He requested the first available appointment with DR Ninetta LightsHatcher, RN made appointment for 1/14. [facility] [address]. Genvoya cancelled. Andree CossHowell, Shakora Nordquist M, RN

## 2017-06-04 NOTE — Telephone Encounter (Signed)
Thanks Marcelino DusterMichelle I explained this to him previously and he was ok with it (the truvada in the stribild).   I will also talk to him about behavioral expectations

## 2017-06-04 NOTE — Telephone Encounter (Signed)
Patient needs reevaluation and medication management by a Neurologist

## 2017-06-04 NOTE — Telephone Encounter (Signed)
Patient has appt with Neurology 07/21/17. Establishing care here 06/23/17 with Perry Norton.  Attempted to call patient multiple times today and am unable to leave message. The phone keeps ringing until it is disconnected.

## 2017-06-23 ENCOUNTER — Ambulatory Visit: Payer: Medicaid Other | Admitting: Nurse Practitioner

## 2017-07-05 ENCOUNTER — Other Ambulatory Visit: Payer: Self-pay

## 2017-07-05 ENCOUNTER — Ambulatory Visit (INDEPENDENT_AMBULATORY_CARE_PROVIDER_SITE_OTHER): Payer: Medicaid Other | Admitting: Infectious Diseases

## 2017-07-05 ENCOUNTER — Ambulatory Visit: Payer: PRIVATE HEALTH INSURANCE | Admitting: Infectious Diseases

## 2017-07-05 ENCOUNTER — Emergency Department (HOSPITAL_COMMUNITY)
Admission: EM | Admit: 2017-07-05 | Discharge: 2017-07-05 | Disposition: A | Discharge status: Home / Self Care | Payer: Medicaid Other | Attending: Emergency Medicine | Admitting: Emergency Medicine

## 2017-07-05 ENCOUNTER — Emergency Department (HOSPITAL_COMMUNITY): Payer: Medicaid Other

## 2017-07-05 ENCOUNTER — Encounter: Payer: Self-pay | Admitting: Infectious Diseases

## 2017-07-05 ENCOUNTER — Encounter (HOSPITAL_COMMUNITY): Payer: Self-pay

## 2017-07-05 VITALS — BP 148/93 | HR 95 | Wt 183.0 lb

## 2017-07-05 DIAGNOSIS — N182 Chronic kidney disease, stage 2 (mild): Secondary | ICD-10-CM

## 2017-07-05 DIAGNOSIS — G40909 Epilepsy, unspecified, not intractable, without status epilepticus: Secondary | ICD-10-CM | POA: Diagnosis not present

## 2017-07-05 DIAGNOSIS — R091 Pleurisy: Secondary | ICD-10-CM | POA: Diagnosis not present

## 2017-07-05 DIAGNOSIS — B20 Human immunodeficiency virus [HIV] disease: Secondary | ICD-10-CM

## 2017-07-05 DIAGNOSIS — M545 Low back pain: Secondary | ICD-10-CM | POA: Diagnosis not present

## 2017-07-05 DIAGNOSIS — K089 Disorder of teeth and supporting structures, unspecified: Secondary | ICD-10-CM

## 2017-07-05 DIAGNOSIS — A539 Syphilis, unspecified: Secondary | ICD-10-CM

## 2017-07-05 DIAGNOSIS — Z113 Encounter for screening for infections with a predominantly sexual mode of transmission: Secondary | ICD-10-CM | POA: Diagnosis not present

## 2017-07-05 DIAGNOSIS — F17210 Nicotine dependence, cigarettes, uncomplicated: Secondary | ICD-10-CM | POA: Insufficient documentation

## 2017-07-05 DIAGNOSIS — R569 Unspecified convulsions: Secondary | ICD-10-CM

## 2017-07-05 DIAGNOSIS — Z79899 Other long term (current) drug therapy: Secondary | ICD-10-CM | POA: Insufficient documentation

## 2017-07-05 DIAGNOSIS — R079 Chest pain, unspecified: Secondary | ICD-10-CM | POA: Diagnosis present

## 2017-07-05 DIAGNOSIS — M549 Dorsalgia, unspecified: Secondary | ICD-10-CM

## 2017-07-05 HISTORY — DX: Pleurisy: R09.1

## 2017-07-05 LAB — BASIC METABOLIC PANEL
Anion gap: 12 (ref 5–15)
BUN: 11 mg/dL (ref 6–20)
CALCIUM: 8.8 mg/dL — AB (ref 8.9–10.3)
CO2: 25 mmol/L (ref 22–32)
CREATININE: 1.58 mg/dL — AB (ref 0.61–1.24)
Chloride: 98 mmol/L — ABNORMAL LOW (ref 101–111)
GFR calc Af Amer: >60 mL/min (ref >60)
GFR, EST NON AFRICAN AMERICAN: 54 mL/min — AB (ref >60)
GLUCOSE: 133 mg/dL — AB (ref 65–99)
Potassium: 3.4 mmol/L — ABNORMAL LOW (ref 3.5–5.1)
SODIUM: 135 mmol/L (ref 135–145)

## 2017-07-05 LAB — CBC
HCT: 42.6 % (ref 39.0–52.0)
Hemoglobin: 14.4 g/dL (ref 13.0–17.0)
MCH: 29.7 pg (ref 26.0–34.0)
MCHC: 33.8 g/dL (ref 30.0–36.0)
MCV: 87.8 fL (ref 78.0–100.0)
PLATELETS: 201 10*3/uL (ref 150–400)
RBC: 4.85 MIL/uL (ref 4.22–5.81)
RDW: 13.3 % (ref 11.5–15.5)
WBC: 8.3 10*3/uL (ref 4.0–10.5)

## 2017-07-05 LAB — I-STAT TROPONIN, ED
TROPONIN I, POC: 0.01 ng/mL (ref 0.00–0.08)
Troponin i, poc: 0.01 ng/mL (ref 0.00–0.08)

## 2017-07-05 LAB — D-DIMER, QUANTITATIVE (NOT AT ARMC)

## 2017-07-05 MED ORDER — LIDOCAINE 5 % EX PTCH: 1.0000 | MEDICATED_PATCH | CUTANEOUS | 0 refills | Status: DC

## 2017-07-05 MED ORDER — HYDROCODONE-ACETAMINOPHEN 5-325 MG PO TABS: 2.0000 | ORAL_TABLET | ORAL | 0 refills | Status: DC | PRN

## 2017-07-05 MED ORDER — HYDROCODONE-ACETAMINOPHEN 5-325 MG PO TABS
1.0000 | ORAL_TABLET | Freq: Once | ORAL | Status: AC
  Administered 2017-07-05: 1 via ORAL
  Filled 2017-07-05: qty 1

## 2017-07-05 MED ORDER — IBUPROFEN 400 MG PO TABS
600.0000 mg | ORAL_TABLET | Freq: Once | ORAL | Status: DC
  Filled 2017-07-05: qty 1

## 2017-07-05 MED ORDER — POTASSIUM CHLORIDE CRYS ER 20 MEQ PO TBCR
20.0000 meq | EXTENDED_RELEASE_TABLET | Freq: Once | ORAL | Status: AC
  Administered 2017-07-05: 20 meq via ORAL
  Filled 2017-07-05: qty 1

## 2017-07-05 MED ORDER — METHOCARBAMOL 500 MG PO TABS: 500.0000 mg | ORAL_TABLET | Freq: Two times a day (BID) | ORAL | 0 refills | Status: DC

## 2017-07-05 NOTE — ED Notes (Signed)
Patient refuses to wear BP cuff for BP monitoring.

## 2017-07-05 NOTE — Assessment & Plan Note (Signed)
He has neuro f/u within the next month.

## 2017-07-05 NOTE — Assessment & Plan Note (Signed)
Not sure if he has muscle strain from his seizure. This seem like most likely explanation.

## 2017-07-05 NOTE — ED Notes (Signed)
Patient stated that he does not want an IV unless necessary. States "I don't want to be stuck."

## 2017-07-05 NOTE — Assessment & Plan Note (Signed)
Cr has been stable.  Not clear how much is cobi.

## 2017-07-05 NOTE — ED Provider Notes (Signed)
[facility] EMERGENCY DEPARTMENT Provider Note   CSN: [id number] Arrival date & time: 07/05/17  0736     History   Chief Complaint Chief Complaint  Patient presents with  . Seizures  . Chest Pain    HPI Perry Norton is a 42 y.o. male.  HPI   Perry Norton is a 42 y.o. male, with a history of HIV and seizures, presenting to the ED with chest pain beginning yesterday.  Pain is left chest, sharp and "feels like squeezing and twisting," 10/10, constant, radiating to the back.  Pain is worse with deep breathing. Patient had a seizure yesterday around 2pm while patient was sleeping and chest pain began after the seizure.   Patient is new to the area, moved to the area Summer 2018. Vimpat was added to patient's medications when he saw his neurologist last April. Has a new patient  appointment for a local neurologist this month, but does not know this person's name. Patient adds that he still has seizures despite his medication. Has multiple seizures a week, which he states is not new.   Denies cough, fever/chills, N/V/D, diaphoresis, falls/trauma, or any other complaints.  HIV quant and CD4 on May 12, 2017: <20 and 520, respectively.   Past Medical History:  Diagnosis Date  . HIV (human immunodeficiency virus infection) (HCC)   . Seizures (HCC)    epilepsy    Patient Active Problem List   Diagnosis Date Noted  . Pleurisy 07/05/2017  . AC separation, type 2, left, initial encounter 05/25/2017  . CRI (chronic renal insufficiency) 05/20/2017  . Syphilis 05/20/2017  . Poor dentition 05/20/2017  . Pharyngeal irritation 12/05/2015  . Wheeze 12/05/2015  . Head trauma 06/10/2015  . 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 Seizure (HCC) 06/10/2015  . Brachial plexopathy 06/07/2014  . Nausea 03/19/2014  . Right arm weakness 03/02/2014  . Disseminated zoster 04/09/2011  . Human immunodeficiency virus (HIV) disease (HCC) 11/06/2010  . Numbness and tingling of right arm  11/06/2010    History reviewed. No pertinent surgical history.     Home Medications    Prior to Admission medications   Medication Sig Start Date End Date Taking? Authorizing Provider  divalproex (DEPAKOTE) 500 MG DR tablet Take 3 tablets (1,500 mg total) by mouth 3 (three) times daily. Patient taking differently: Take 1,000 mg by mouth 2 (two) times daily.  05/17/17  Yes Noel, Tiffany S, PA-C  GENVOYA 150-150-200-10 MG TABS tablet Take 1 tablet by mouth every morning. 06/24/17  Yes [provider]  lacosamide (VIMPAT) 200 MG TABS tablet Take 1 tablet (200 mg total) by mouth 2 (two) times daily. 05/17/17  Yes Danelle Earthly, Tiffany S, PA-C  elvitegravir-cobicistat-emtricitabine-tenofovir (STRIBILD) 150-150-200-300 MG TABS tablet Take 1 tablet by mouth daily with breakfast. Patient not taking: Reported on 07/05/2017 06/03/17   Ginnie Smart, MD  HYDROcodone-acetaminophen (NORCO/VICODIN) 5-325 MG tablet Take 2 tablets by mouth every 4 (four) hours as needed. 07/05/17   Quientin Jent C, PA-C  Lacosamide (VIMPAT) 100 MG TABS Take 1 tablet (100 mg total) by mouth 2 (two) times daily. Patient not taking: Reported on 07/05/2017 01/30/17   Dione Booze, MD  lidocaine (LIDODERM) 5 % Place 1 patch onto the skin daily. Remove & Discard patch within 12 hours or as directed by MD 07/05/17   Everli Rother C, PA-C  methocarbamol (ROBAXIN) 500 MG tablet Take 1 tablet (500 mg total) by mouth 2 (two) times daily. 07/05/17   Alexica Schlossberg, Hillard Danker,  PA-C    Family History Family History  Problem Relation Age of Onset  . Sarcoidosis Mother     Social History Social History   Tobacco Use  . Smoking status: Current Some Day Smoker    Packs/day: 1.00    Types: Cigarettes    Start date: 06/22/1994  . Smokeless tobacco: Never Used  Substance Use Topics  . Alcohol use: Yes    Alcohol/week: 1.8 oz    Types: 3 Cans of beer per week    Comment: occ  . Drug use: Yes    Types: Marijuana    Comment: denies use 05/03/17      Allergies   Onion   Review of Systems Review of Systems  Constitutional: Negative for chills and fever.  Respiratory: Negative for cough and shortness of breath.   Cardiovascular: Positive for chest pain.  Gastrointestinal: Negative for abdominal pain, diarrhea, nausea and vomiting.  Skin: Negative for pallor.  Neurological: Negative for dizziness, weakness, light-headedness and numbness.  All other systems reviewed and are negative.    Physical Exam Updated Vital Signs BP (!) 127/93 (BP Location: Right Arm)   Pulse 97   Temp 98 F (36.7 C) (Oral)   Resp 18   Ht 6' (1.829 m)   Wt 83 kg (183 lb)   SpO2 100%   BMI 24.82 kg/m   Physical Exam  Constitutional: He appears well-developed and well-nourished. No distress.  Patient initially refused to answer questions.  He would roll his eyes at many of the questions.  When he would answer he would sigh beforehand.  HENT:  Head: Normocephalic and atraumatic.  Eyes: Conjunctivae are normal.  Neck: Normal range of motion. Neck supple.  Cardiovascular: Normal rate, regular rhythm, normal heart sounds and intact distal pulses.  Pulmonary/Chest: Effort normal and breath sounds normal. No respiratory distress. He exhibits tenderness (central and left chest).  Abdominal: Soft. There is no tenderness. There is no guarding.  Musculoskeletal: He exhibits no edema.  When I began the exam and pushed lightly on the chest, patient clenched his teeth, balled his fists, glared at me, and refused to answer any further questions.  He has indications of tenderness through the chest, left shoulder, and upper and mid back. When patient was asked to perform active range of motion of the upper extremities, he threw his hands up and then down into his lap and would not do anything further. He would not allow passive ROM exam.  During his ED course, however, it was noted that he would use both his hands and arms, reaching out for medications or  blanket.   Lymphadenopathy:    He has no cervical adenopathy.  Neurological: He is alert.  No noted sensory deficits in the upper extremities. Grip strengths equal bilaterally. Patient would not allow further strength testing to be performed.  Skin: Skin is warm and dry. Capillary refill takes less than 2 seconds. He is not diaphoretic.  Psychiatric: He has a normal mood and affect. His behavior is normal.  Nursing note and vitals reviewed.    ED Treatments / Results  Labs (all labs ordered are listed, but only abnormal results are displayed) Labs Reviewed  BASIC METABOLIC PANEL - Abnormal; Notable for the following components:      Result Value   Potassium 3.4 (*)    Chloride 98 (*)    Glucose, Bld 133 (*)    Creatinine, Ser 1.58 (*)    Calcium 8.8 (*)    GFR calc  non Af Amer 54 (*)    All other components within normal limits  CBC  D-DIMER, QUANTITATIVE (NOT AT [facility])  I-STAT TROPONIN, ED  I-STAT TROPONIN, ED    BUN  Date Value Ref Range Status  07/05/2017 11 6 - 20 mg/dL Final  91/47/8295 8 7 - 25 mg/dL Final  62/13/0865 10 6 - 20 mg/dL Final   Creat  Date Value Ref Range Status  05/12/2017 1.52 (H) 0.60 - 1.35 mg/dL Final   Creatinine, Ser  Date Value Ref Range Status  07/05/2017 1.58 (H) 0.61 - 1.24 mg/dL Final  78/46/9629 5.28 (H) 0.61 - 1.24 mg/dL Final     EKG  EKG Interpretation  Date/Time:  Monday July 05 2017 11:43:02 EST Ventricular Rate:  83 PR Interval:    QRS Duration: 99 QT Interval:  400 QTC Calculation: 470 R Axis:   76 Text Interpretation:  Sinus rhythm RSR' in V1 or V2, right VCD or RVH Left ventricular hypertrophy ST elev, probable normal early repol pattern Baseline wander in lead(s) V2 No previous tracing Confirmed by Gwyneth Sprout ([id number]) on 07/05/2017 12:17:09 PM       Radiology Dg Chest 2 View  Result Date: 07/05/2017 CLINICAL DATA:  Chest pain following seizure EXAM: CHEST  2 VIEW COMPARISON:  None. FINDINGS: Lungs are  clear. Heart size and pulmonary vascularity are normal. No adenopathy. No pneumothorax. No bone lesions. IMPRESSION: No edema or consolidation. Electronically Signed   By: Bretta Bang III M.D.   On: 07/05/2017 08:16   Dg Shoulder Left  Result Date: 07/05/2017 CLINICAL DATA:  Seizure, left shoulder pain EXAM: LEFT SHOULDER - 2+ VIEW COMPARISON:  None. FINDINGS: No acute bony abnormality. Specifically, no fracture, subluxation, or dislocation. Joint spaces maintained. IMPRESSION: Negative. Electronically Signed   By: Charlett Nose M.D.   On: 07/05/2017 10:48    Procedures Procedures (including critical care time)  Medications Ordered in ED Medications  ibuprofen (ADVIL,MOTRIN) tablet 600 mg (600 mg Oral Refused 07/05/17 1016)  HYDROcodone-acetaminophen (NORCO/VICODIN) 5-325 MG per tablet 1 tablet (1 tablet Oral Given 07/05/17 1016)  potassium chloride SA (K-DUR,KLOR-CON) CR tablet 20 mEq (20 mEq Oral Given 07/05/17 1225)     Initial Impression / Assessment and Plan / ED Course  I have reviewed the triage vital signs and the nursing notes.  Pertinent labs & imaging results that were available during my care of the patient were reviewed by me and considered in my medical decision making (see chart for details).  Clinical Course as of Jul 06 1627  Mon Jul 05, 2017  4010 Patient not yet in the room.  [SJ]    Clinical Course User Index [SJ] Dejohn Ibarra C, PA-C    Patient presents with chest pain following a seizure yesterday.  Pain appears to be reproducible on exam, however, patient seemed resistant to most parts of his workup and exam.  He refused IV access.  Refused ibuprofen, but accepted vicodin.  He scoffed, glared, and was generally unpleasant during his ED course.   Patient had an AC separation in December.  He was instructed to begin using the sling again and follow-up with orthopedics regarding his shoulder pain today. He would not allow a full exam of his left upper extremity  and shoulder.  PCP follow-up as needed.  Resources given. The patient was given instructions for home care as well as return precautions. Patient voices understanding of these instructions, accepts the plan, and is comfortable with discharge.  Vitals:  07/05/17 1138 07/05/17 1139 07/05/17 1155 07/05/17 1224  BP:    118/79  Pulse: 76 77 76   Resp: 18 20 17  (!) 22  Temp:      TempSrc:      SpO2: 99% 98% 94%   Weight:      Height:         Final Clinical Impressions(s) / ED Diagnoses   Final diagnoses:  Chest pain, unspecified type  Other acute back pain    ED Discharge Orders        Ordered    methocarbamol (ROBAXIN) 500 MG tablet  2 times daily     07/05/17 1247    HYDROcodone-acetaminophen (NORCO/VICODIN) 5-325 MG tablet  Every 4 hours PRN     07/05/17 1247    lidocaine (LIDODERM) 5 %  Every 24 hours     07/05/17 1247       Anselm Pancoast, PA-C 07/05/17 1629    Gwyneth Sprout, MD 07/06/17 1044

## 2017-07-05 NOTE — Assessment & Plan Note (Signed)
Has appt later this month 

## 2017-07-05 NOTE — ED Triage Notes (Signed)
Per Pt, Pt reports that he had a seizure yesterday with hx of the same. Reports that he has had chest pain and back pain that has continued since then. Alert and Oriented x4 Upon arrival to the ED.

## 2017-07-05 NOTE — ED Notes (Addendum)
Patient rude. Glaring. Agitated. Refuses blood pressure cuff, pulse ox, or cardiac monitoring. States "I just want to go home."
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
## 2017-07-05 NOTE — Assessment & Plan Note (Addendum)
He has been doing ok Refuses flu vax.  Wife is (-), has condoms.  She gets tested regularly. Will get her tested here as she wishes.  Offered PREP.  rtc in 6 months.

## 2017-07-05 NOTE — Progress Notes (Signed)
   Subjective:    Patient ID: Perry Norton, male    DOB: 1976/02/11, 42 y.o.   MRN: [id number]  HPI 42 yo M with hx of HIV+, dx 2009. Prev on stribild, updated to genvoya at last visit. He called his prev MD to verify this after he had side effects for first few weeks. These have improved.  He also has hx of seizure d/o. He had seizure yesterday. He has no recollection of this.  He was seen in ED this AM for L chest and pain. He also has L scapular pain that radiates to lower back. He had pleuritic pain and had to breath shallow. He had negative troponin, d-dimer,  L shoulder and chest x-rays were normal.  ESG showed NSR 83, early repol.  In ED he was given "muscle relaxer" which helped. Concerned that he has ripped muscle in his back.   Denies missed doses of vimpat, depakote.    HIV 1 RNA Quant (Copies/mL)  Date Value  05/12/2017 <20 (H)   CD4 T Cell Abs (/uL)  Date Value  05/12/2017 520    Review of Systems  Constitutional: Negative for appetite change and unexpected weight change.  Gastrointestinal: Negative for constipation and diarrhea.  Genitourinary: Negative for difficulty urinating.  Musculoskeletal: Positive for back pain.  Neurological: Positive for seizures.  Please see HPI. All other systems reviewed and negative.     Objective:   Physical Exam  Constitutional: He appears well-developed and well-nourished. He appears distressed.  HENT:  Mouth/Throat: Abnormal dentition. No oropharyngeal exudate.  Eyes: EOM are normal. Pupils are equal, round, and reactive to light.  Neck: Neck supple.  Cardiovascular: Normal rate, regular rhythm and normal heart sounds.  Pulmonary/Chest: Effort normal and breath sounds normal.  Abdominal: Soft. Bowel sounds are normal. There is no tenderness. There is no rebound.  Musculoskeletal: He exhibits no edema.  Lymphadenopathy:    He has no cervical adenopathy.       Assessment & Plan:

## 2017-07-05 NOTE — ED Notes (Signed)
Patient made statement to staff that "I have a temper."

## 2017-07-05 NOTE — Discharge Instructions (Addendum)
Expect your soreness to increase over the next 2-3 days. Take it easy, but do not lay around too much as this may make any stiffness worse. Put your sling back on and keep it there until pain improves or orthopedic specialist says otherwise. Antiinflammatory medications: Take 600 mg of ibuprofen every 6 hours or 440 mg (over the counter dose) to 500 mg (prescription dose) of naproxen every 12 hours for the next 3 days. After this time, these medications may be used as needed for pain. Take these medications with food to avoid upset stomach. Choose only one of these medications, do not take them together.  Tylenol: Should you continue to have additional pain while taking the ibuprofen or naproxen, you may add in tylenol as needed. Your daily total maximum amount of tylenol from all sources should be limited to 4000mg /day for persons without liver problems, or 2000mg /day for those with liver problems. Muscle relaxer: Robaxin is a muscle relaxer and may help loosen stiff muscles. Do not take the Robaxin while driving or performing other dangerous activities.  Lidocaine patches: These are available via either prescription or over-the-counter. The over-the-counter option may be more economical one and are likely just as effective. There are multiple over-the-counter brands, such as Salonpas. Exercises: Be sure to perform the attached exercises starting with three times a week and working up to performing them daily. This is an essential part of preventing long term problems.   Follow up with a primary care provider for any future management of these complaints. Should shoulder pain continue, you may also need to follow-up with an orthopedic specialist.

## 2017-07-05 NOTE — ED Notes (Signed)
Harolyn RutherfordShawn Joy, PA at bedside at this time.

## 2017-07-06 LAB — T-HELPER CELL (CD4) - (RCID CLINIC ONLY)
CD4 % Helper T Cell: 23 % — ABNORMAL LOW (ref 33–55)
CD4 T CELL ABS: 540 /uL (ref 400–2700)

## 2017-07-07 LAB — CBC
HEMATOCRIT: 43.1 % (ref 38.5–50.0)
HEMOGLOBIN: 14.4 g/dL (ref 13.2–17.1)
MCH: 28.9 pg (ref 27.0–33.0)
MCHC: 33.4 g/dL (ref 32.0–36.0)
MCV: 86.4 fL (ref 80.0–100.0)
MPV: 11.6 fL (ref 7.5–12.5)
Platelets: 205 10*3/uL (ref 140–400)
RBC: 4.99 10*6/uL (ref 4.20–5.80)
RDW: 13.3 % (ref 11.0–15.0)
WBC: 5.8 10*3/uL (ref 3.8–10.8)

## 2017-07-07 LAB — LIPID PANEL
CHOL/HDL RATIO: 3.9 (calc) (ref <5.0)
Cholesterol: 231 mg/dL — ABNORMAL HIGH (ref <200)
HDL: 60 mg/dL (ref >40)
LDL CHOLESTEROL (CALC): 143 mg/dL — AB
Non-HDL Cholesterol (Calc): 171 mg/dL (calc) — ABNORMAL HIGH (ref <130)
Triglycerides: 153 mg/dL — ABNORMAL HIGH (ref <150)

## 2017-07-07 LAB — COMPREHENSIVE METABOLIC PANEL
AG Ratio: 1.5 (calc) (ref 1.0–2.5)
ALBUMIN MSPROF: 4.5 g/dL (ref 3.6–5.1)
ALT: 13 U/L (ref 9–46)
AST: 25 U/L (ref 10–40)
Alkaline phosphatase (APISO): 65 U/L (ref 40–115)
BILIRUBIN TOTAL: 0.6 mg/dL (ref 0.2–1.2)
BUN/Creatinine Ratio: 8 (calc) (ref 6–22)
BUN: 12 mg/dL (ref 7–25)
CALCIUM: 9.5 mg/dL (ref 8.6–10.3)
CHLORIDE: 98 mmol/L (ref 98–110)
CO2: 28 mmol/L (ref 20–32)
Creat: 1.53 mg/dL — ABNORMAL HIGH (ref 0.60–1.35)
GLOBULIN: 3.1 g/dL (ref 1.9–3.7)
Glucose, Bld: 107 mg/dL — ABNORMAL HIGH (ref 65–99)
POTASSIUM: 4 mmol/L (ref 3.5–5.3)
Sodium: 137 mmol/L (ref 135–146)
Total Protein: 7.6 g/dL (ref 6.1–8.1)

## 2017-07-07 LAB — RPR: RPR: REACTIVE — AB

## 2017-07-07 LAB — FLUORESCENT TREPONEMAL AB(FTA)-IGG-BLD: FLUORESCENT TREPONEMAL ABS: REACTIVE — AB

## 2017-07-07 LAB — RPR TITER

## 2017-07-07 LAB — HIV-1 RNA QUANT-NO REFLEX-BLD
HIV 1 RNA Quant: <20 copies/mL
HIV-1 RNA Quant, Log: <1.3 Log copies/mL

## 2017-07-15 ENCOUNTER — Telehealth: Payer: Self-pay | Admitting: *Deleted

## 2017-07-15 NOTE — Telephone Encounter (Signed)
Patient called, asked to speak to the nurse on call for Dr Ninetta LightsHatcher. He was upset he did not receive a call about his lab results.  RN reviewed results with patient, answered his questions.  He would like more information regarding his RPR.  Offered to help him sign up for mychart so he can see his lab results and communicate with us, patient declined. RN encouraged patient to schedule lab appointment 2 weeks before his doctor's visit so he could discuss lab results with the physician in person. Patient disconnected the call.  Please advise on RPR results. Andree CossHowell, Cloria Ciresi M, RN

## 2017-07-16 NOTE — Telephone Encounter (Signed)
He needs to be treated for syphilis if he has not (doxy or PEN G).  Would repeat his RPR at next visit 2-3 months and if not improved, PEN G

## 2017-07-16 NOTE — Telephone Encounter (Signed)
Doxy prescription written 04/2017 - is this sufficient, or will he need additional treatment?

## 2017-07-19 ENCOUNTER — Telehealth: Payer: Self-pay | Admitting: *Deleted

## 2017-07-19 NOTE — Telephone Encounter (Signed)
Patient called about his recent lab test advised has a question about his RPR results as he has taken the oral medication for 14 days like he was told in November. Not sure why he still shows a titer. Advised him that the injectable works quicker than the oral meds. Advised we usually do not retest for 3-6 months but because he took the oral it will take longer for the titer to go down and he is still considered infectious until it goes down so he should use protection to not spread the infection. He also asked about the Hep A and B reactive. Advised him that does not mean positive but that the person has been exposed or immunized against Hep A/B. He advised he understands and will use condoms every time.

## 2017-07-21 ENCOUNTER — Ambulatory Visit (INDEPENDENT_AMBULATORY_CARE_PROVIDER_SITE_OTHER): Payer: Medicaid Other | Admitting: Neurology

## 2017-07-21 ENCOUNTER — Encounter: Payer: Self-pay | Admitting: Neurology

## 2017-07-21 VITALS — BP 150/76 | HR 74 | Ht 72.0 in | Wt 183.0 lb

## 2017-07-21 DIAGNOSIS — G894 Chronic pain syndrome: Secondary | ICD-10-CM

## 2017-07-21 DIAGNOSIS — G43019 Migraine without aura, intractable, without status migrainosus: Secondary | ICD-10-CM

## 2017-07-21 DIAGNOSIS — G40009 Localization-related (focal) (partial) idiopathic epilepsy and epileptic syndromes with seizures of localized onset, not intractable, without status epilepticus: Secondary | ICD-10-CM

## 2017-07-21 MED ORDER — LACOSAMIDE 200 MG PO TABS: 200.0000 mg | ORAL_TABLET | Freq: Two times a day (BID) | ORAL | 5 refills | Status: DC

## 2017-07-21 MED ORDER — TOPIRAMATE 50 MG PO TABS: ORAL_TABLET | ORAL | 6 refills | Status: DC

## 2017-07-21 MED ORDER — DIVALPROEX SODIUM 500 MG PO DR TAB: DELAYED_RELEASE_TABLET | ORAL | 6 refills | Status: DC

## 2017-07-21 NOTE — Patient Instructions (Addendum)
1. Schedule MRI brain with and without contrast 2. Refer to Pain Management for right shoulder, neck, arm pain 3. Start Topamax 50mg : Take 1/2 tablet twice a day for a week, then increase to 1 tablet twice a day for a week, then increase to 2 tablets twice a day and continue 4. Continue Depakote 1000mg  in AM and PM, 250mg  at noon. Continue Vimpat 200mg  twice a day 5. Follow-up in 2-3 months, call for any changes  Seizure Precautions: 1. If medication has been prescribed for you to prevent seizures, take it exactly as directed.  Do not stop taking the medicine without talking to your doctor first, even if you have not had a seizure in a long time.   2. Avoid activities in which a seizure would cause danger to yourself or to others.  Don't operate dangerous machinery, swim alone, or climb in high or dangerous places, such as on ladders, roofs, or girders.  Do not drive unless your doctor says you may.  3. If you have any warning that you may have a seizure, lay down in a safe place where you can't hurt yourself.    4.  No driving for 6 months from last seizure, [place].   Please refer to the following link on the Epilepsy Foundation of America's website for more information: http://www.epilepsyfoundation.org/answerplace/Social/driving/drivingu.cfm   5.  Maintain good sleep hygiene. Avoid alcohol.  6.  Contact your doctor if you have any problems that may be related to the medicine you are taking.  7.  Call 911 and bring the patient back to the ED if:        A.  The seizure lasts longer than 5 minutes.       B.  The patient doesn't awaken shortly after the seizure  C.  The patient has new problems such as difficulty seeing, speaking or moving  D.  The patient was injured during the seizure  E.  The patient has a temperature over 102 F (39C)  F.  The patient vomited and now is having trouble breathing

## 2017-07-21 NOTE — Progress Notes (Signed)
NEUROLOGY CONSULTATION NOTE  Perry Norton MRN: [id number] DOB: May 18, 1976  Referring provider: Dr. Gwyneth Sprout (ER) Primary care provider: Scot Jun, PA-C  Reason for consult:  Establish care for seizures  Dear Dr Preston Fleeting:  Thank you for your kind referral of Perry Norton for consultation of the above symptoms. Although his history is well known to you, please allow me to reiterate it for the purpose of our medical record. Records and images were personally reviewed where available.  HISTORY OF PRESENT ILLNESS: This is a 42 year old right-handed man with a history of HIV, chronic right arm/shoulder/neck pain, and seizures since 2016 presenting to establish care. He is quite unhappy in the office today and states that he is "always angry." [place] [facility] [place] ago, records were reviewed. He started having seizures in June 2016. His wife witnessed it and reported he suddenly froze in place, was unresponsive, then began to foam at the mouth and dropped to the floor with 1-2 minutes of tonic-clonic activity. He was febrile on admission, MRI brain, CSF studies were negative. He had a routine and 1-hour EEG which were normal. He was started on Keppra, which "made me a super a--hole." He had been taking gabapentin for the pain and dose was increased. He was having both nocturnal seizures and daytime seizures. He was referred to a tertiary epilepsy center where he had an abnormal 24-hour EEG with intermittent spike and slow wave discharges in the right anterior temporal region with maxima at F8 more in sleep and occasional right temporal polymorphic theta range slowing. He was started on Depakote to help with mood as well. Depakote dose was uptitrated, he has been taking 1000mg  in AM, 250mg  at noon, 1000mg  in PM but continued to have seizures a couple times a week. He was in [facility] in August 2018 for several nocturnal seizures. Depakote level was 104. Vimpat was added but he did not  fill the prescription until another ER visit in November 2018 for left shoulder pain presumably from seizures. Xray showed moderate left AC separation. Depakote level was <10, he was unable to pick up Depakote from his pharmacy. He reports taking his medications regularly, he is on Depakote 1000-250-1000mg  and Vimpat 200mg  BID, but states these are not helping. No side effects. He had a seizure on Saturday, then one yesterday. No tongue bite or incontinence. He denies any warning prior to the seizures, but is concerned that it takes him 30 minutes to come back to baseline where in the past it took only a minute. No focal weakness. He denies any gaps in time, staring/unresponsive episodes, olfactory/gustatory hallucinations, myoclonic jerks. He drinks Christiane Ha on a daily basis to numb the right-sided pain. He states he is "still pissed off because of the pain, he has too much of an attitude and liquor helps some." His wife has told him he needs to curb his temper because this can set off his seizures. He is unsure if sleep is a trigger.  He also started having headaches when the seizures started. He reports daily headaches lasting 30 minutes to a couple of hours, mostly over the left temporal region radiating behind his left ear. He feels a pounding pain like someone is hitting him with a hammer. He is sensitive to lights, no nausea/vomiting. Tylenol and Advil were not helpful so he does not take anything. He was previously on hydrocodone for the right shoulder/arm pain, and is upset that he has difficulty finding  a doctor to prescribe this for him. He saw Ortho and reports muscle relaxants are not helping. The right-sided pain started in 2012, he slipped on stairs and landed on his neck and shoulder. Since then he has had right arm numbness. He can move his fingers but has difficulty lifting his arm up due to the pain. He has pain in his neck, shoulder, down to his mid-back region, described as a squeezing and  turning pain. Legs are unaffected. He had an EMG/NCV which was normal. MRI cervical spine in 2016 was normal. MRI of right shoulder in 2014 showed mild nonspecific bone marrow edema in distal portion of right clavicle and acromion, otherwise normal.   Prior AEDs: Keppra, Gabapentin (for pain)  Epilepsy Risk Factors:  He had a normal birth and early development.  There is no history of febrile convulsions, CNS infections such as meningitis/encephalitis, significant traumatic brain injury, neurosurgical procedures, or family history of seizures.  PAST MEDICAL HISTORY: Past Medical History:  Diagnosis Date  . HIV (human immunodeficiency virus infection) (HCC)   . Seizures (HCC)    epilepsy    PAST SURGICAL HISTORY: No past surgical history on file.  MEDICATIONS: Current Outpatient Medications on File Prior to Visit  Medication Sig Dispense Refill  . divalproex (DEPAKOTE) 500 MG DR tablet Take 3 tablets (1,500 mg total) by mouth 3 (three) times daily. (Patient taking differently: Take 1,000 mg by mouth 2 (two) times daily. ) 90 tablet 1  . elvitegravir-cobicistat-emtricitabine-tenofovir (STRIBILD) 150-150-200-300 MG TABS tablet Take 1 tablet by mouth daily with breakfast. 30 tablet 5  . GENVOYA 150-150-200-10 MG TABS tablet Take 1 tablet by mouth every morning.  3  . HYDROcodone-acetaminophen (NORCO/VICODIN) 5-325 MG tablet Take 2 tablets by mouth every 4 (four) hours as needed. 10 tablet 0  . lacosamide (VIMPAT) 200 MG TABS tablet Take 1 tablet (200 mg total) by mouth 2 (two) times daily. 60 tablet 1  . lidocaine (LIDODERM) 5 % Place 1 patch onto the skin daily. Remove & Discard patch within 12 hours or as directed by MD 30 patch 0  . methocarbamol (ROBAXIN) 500 MG tablet Take 1 tablet (500 mg total) by mouth 2 (two) times daily. 20 tablet 0  . Lacosamide (VIMPAT) 100 MG TABS Take 1 tablet (100 mg total) by mouth 2 (two) times daily. (Patient not taking: Reported on 07/05/2017) 60 tablet 0    No current facility-administered medications on file prior to visit.     ALLERGIES: Allergies  Allergen Reactions  . Onion Rash    FAMILY HISTORY: Family History  Problem Relation Age of Onset  . Sarcoidosis Mother     SOCIAL HISTORY: Social History   Socioeconomic History  . Marital status: Single    Spouse name: Not on file  . Number of children: Not on file  . Years of education: Not on file  . Highest education level: Not on file  Social Needs  . Financial resource strain: Not on file  . Food insecurity - worry: Not on file  . Food insecurity - inability: Not on file  . Transportation needs - medical: Not on file  . Transportation needs - non-medical: Not on file  Occupational History  . Not on file  Tobacco Use  . Smoking status: Current Some Day Smoker    Packs/day: 1.00    Types: Cigarettes    Start date: 06/22/1994  . Smokeless tobacco: Never Used  Substance and Sexual Activity  . Alcohol use: Yes  Alcohol/week: 1.8 oz    Types: 3 Cans of beer per week    Comment: occ  . Drug use: Yes    Types: Marijuana    Comment: denies use 05/03/17  . Sexual activity: Not on file  Other Topics Concern  . Not on file  Social History Narrative  . Not on file    REVIEW OF SYSTEMS: Constitutional: No fevers, chills, or sweats, no generalized fatigue, change in appetite Eyes: No visual changes, double vision, eye pain Ear, nose and throat: No hearing loss, ear pain, nasal congestion, sore throat Cardiovascular: No chest pain, palpitations Respiratory:  No shortness of breath at rest or with exertion, wheezes GastrointestinaI: No nausea, vomiting, diarrhea, abdominal pain, fecal incontinence Genitourinary:  No dysuria, urinary retention or frequency Musculoskeletal:  + neck pain, back pain Integumentary: No rash, pruritus, skin lesions Neurological: as above Psychiatric: No depression, insomnia, anxiety Endocrine: No palpitations, fatigue, diaphoresis, mood  swings, change in appetite, change in weight, increased thirst Hematologic/Lymphatic:  No anemia, purpura, petechiae. Allergic/Immunologic: no itchy/runny eyes, nasal congestion, recent allergic reactions, rashes  PHYSICAL EXAM: Vitals:   07/21/17 1414  BP: (!) 150/76  Pulse: 74  SpO2: 100%   General: No acute distress, very angry in the office today Head:  Normocephalic/atraumatic Eyes: Refused, got upset when light shone in eyes to check pupils Neck: supple, no paraspinal tenderness, full range of motion Back: No paraspinal tenderness Heart: regular rate and rhythm Lungs: Clear to auscultation bilaterally. Vascular: No carotid bruits. Skin/Extremities: No rash, no edema Neurological Exam: Mental status: alert and oriented to person, place, and time, no dysarthria or aphasia, Fund of knowledge is appropriate.  Recent and remote memory are intact.  Attention and concentration are normal.    Able to name objects and repeat phrases. Cranial nerves: CN I: not tested CN II: pupils equal, round and reactive to light, visual fields intact, fundi unremarkable. CN III, IV, VI:  full range of motion, no nystagmus, no ptosis CN V: facial sensation intact CN VII: upper and lower face symmetric CN VIII: hearing intact to finger rub CN IX, X: gag intact, uvula midline CN XI: sternocleidomastoid and trapezius muscles intact CN XII: tongue midline Bulk & Tone: normal, no fasciculations. Motor: 5/5 on left UE, both LE. At least 3/5 on proximal right UE with pain on movements, he became very uncomfortable and reported muscle spasms when testing, 5/5 distally, no pronator drift. Sensation: decreased pin, cold on right UE, intact pin and cold on both LE, intact vibration and joint position sense.  Romberg test negativee Deep Tendon Reflexes: +2 throughout, no ankle clonus Plantar responses: downgoing bilaterally Cerebellar: no incoordination on finger to nose testing Gait: slow and cautious due to  back pain, no ataxia Tremor: none  IMPRESSION: This is a 42 year old right-handed man with a history of right temporal lobe epilepsy, chronic right arm/shoulder/neck pain, headaches with migrainous features, presenting to establish care. He continues to have seizures on Depakote 2500mg  daily dose and Vimpat 200mg  BID. He reports headaches on a daily basis. MRI brain with and without contrast will be ordered to assess for underlying structural abnormality. We discussed adding on Topamax for both seizure and headache prophylaxis.Side effects were discussed. Once therapeutic, we will taper off Vimpat. We discussed how alcohol can lower seizure threshold. He is very angry and refuses to see Behavioral Health. He will be referred to Pain Management for right shoulder/arm/neck pain. [place] driving laws were discussed with the patient, and he knows  to stop driving after a seizure, until 6 months seizure-free. As he was checking out, he asked for water and appeared like he was about to pass out, per staff. Staff witnessed him crumple down to the ground, no head injuries. Staff reports he briefly shook when he was turned to his side. He came to after a few minutes and could answer where he was, name, and age. He was advised to wait for his wife and not drive, but left the office suddenly. Follow-up scheduled in 2-3 months.  Thank you for allowing me to participate in the care of this patient. Please do not hesitate to call for any questions or concerns.   Patrcia Dolly, M.D.  CC: Dr. Anitra Lauth (ER)

## 2017-07-22 ENCOUNTER — Telehealth: Payer: Self-pay

## 2017-07-22 DIAGNOSIS — G894 Chronic pain syndrome: Secondary | ICD-10-CM | POA: Insufficient documentation

## 2017-07-22 DIAGNOSIS — G43019 Migraine without aura, intractable, without status migrainosus: Secondary | ICD-10-CM | POA: Insufficient documentation

## 2017-07-22 DIAGNOSIS — G40009 Localization-related (focal) (partial) idiopathic epilepsy and epileptic syndromes with seizures of localized onset, not intractable, without status epilepticus: Secondary | ICD-10-CM | POA: Insufficient documentation

## 2017-07-22 HISTORY — DX: Localization-related (focal) (partial) idiopathic epilepsy and epileptic syndromes with seizures of localized onset, not intractable, without status epilepticus: G40.009

## 2017-07-22 HISTORY — DX: Chronic pain syndrome: G89.4

## 2017-07-22 HISTORY — DX: Migraine without aura, intractable, without status migrainosus: G43.019

## 2017-07-22 NOTE — Telephone Encounter (Signed)
Pt had fall while leaving the office yesterday.  Once he was up, I called phone number listed for his wife, Clydie BraunKaren.  There was no answer, so I immediately called back.  Again, no answer.  I LMOVM asking for return call as I was concerned that pt should not drive while in his condition.  Have not received return call.

## 2017-07-26 ENCOUNTER — Ambulatory Visit: Payer: Medicaid - Out of State | Admitting: Nurse Practitioner

## 2017-07-27 ENCOUNTER — Ambulatory Visit: Payer: Medicaid Other | Admitting: Nurse Practitioner

## 2017-07-27 ENCOUNTER — Other Ambulatory Visit: Payer: Self-pay

## 2017-07-27 DIAGNOSIS — R202 Paresthesia of skin: Principal | ICD-10-CM

## 2017-07-27 DIAGNOSIS — G894 Chronic pain syndrome: Secondary | ICD-10-CM

## 2017-07-27 DIAGNOSIS — R200 Anesthesia of skin: Secondary | ICD-10-CM

## 2017-07-27 DIAGNOSIS — R29898 Other symptoms and signs involving the musculoskeletal system: Secondary | ICD-10-CM

## 2017-07-27 DIAGNOSIS — M542 Cervicalgia: Secondary | ICD-10-CM

## 2017-08-03 ENCOUNTER — Ambulatory Visit
Admission: RE | Admit: 2017-08-03 | Discharge: 2017-08-03 | Disposition: A | Discharge status: Home / Self Care | Payer: Medicaid Other | Source: Ambulatory Visit | Attending: Neurology | Admitting: Neurology

## 2017-08-03 DIAGNOSIS — G43019 Migraine without aura, intractable, without status migrainosus: Secondary | ICD-10-CM

## 2017-08-03 DIAGNOSIS — G40009 Localization-related (focal) (partial) idiopathic epilepsy and epileptic syndromes with seizures of localized onset, not intractable, without status epilepticus: Secondary | ICD-10-CM

## 2017-08-03 DIAGNOSIS — G894 Chronic pain syndrome: Secondary | ICD-10-CM

## 2017-08-05 ENCOUNTER — Telehealth: Payer: Self-pay

## 2017-08-05 NOTE — Telephone Encounter (Signed)
Called pt to relay message below.  No answer.  Phone rang approximately 6 times and call was disconnected.

## 2017-08-05 NOTE — Telephone Encounter (Signed)
-----   Message from Van ClinesKaren M Aquino, MD sent at 08/03/2017  3:08 PM EST ----- Pls let him know MRI brain is normal, no evidence of tumor, stroke, or bleed. Thanks

## 2017-08-06 ENCOUNTER — Telehealth: Payer: Self-pay | Admitting: Neurology

## 2017-08-06 ENCOUNTER — Telehealth: Payer: Self-pay

## 2017-08-06 NOTE — Telephone Encounter (Signed)
Spoke with pt's wife, Karen.  Relayed MRI resuClydie Braunlts.  Let Clydie BraunKaren know about incident that occurred during pt's visit on 1/30.  Informed Clydie BraunKaren of [place] driving laws in regards to seizure pt's.

## 2017-08-06 NOTE — Telephone Encounter (Signed)
Patient called and wants the results of the MRI please call

## 2017-08-06 NOTE — Telephone Encounter (Signed)
Returned call to pt. MRI results relayed

## 2017-08-09 ENCOUNTER — Other Ambulatory Visit: Payer: PRIVATE HEALTH INSURANCE

## 2017-08-20 ENCOUNTER — Ambulatory Visit: Payer: Medicaid Other | Attending: Nurse Practitioner | Admitting: Nurse Practitioner

## 2017-08-20 ENCOUNTER — Encounter: Payer: Self-pay | Admitting: Nurse Practitioner

## 2017-08-20 VITALS — BP 157/124 | HR 104 | Temp 99.1°F | Ht 72.0 in | Wt 183.0 lb

## 2017-08-20 DIAGNOSIS — G40909 Epilepsy, unspecified, not intractable, without status epilepticus: Secondary | ICD-10-CM | POA: Insufficient documentation

## 2017-08-20 DIAGNOSIS — F329 Major depressive disorder, single episode, unspecified: Secondary | ICD-10-CM | POA: Diagnosis not present

## 2017-08-20 DIAGNOSIS — F17200 Nicotine dependence, unspecified, uncomplicated: Secondary | ICD-10-CM | POA: Diagnosis not present

## 2017-08-20 DIAGNOSIS — Z79899 Other long term (current) drug therapy: Secondary | ICD-10-CM | POA: Diagnosis not present

## 2017-08-20 DIAGNOSIS — I10 Essential (primary) hypertension: Secondary | ICD-10-CM | POA: Diagnosis not present

## 2017-08-20 DIAGNOSIS — Z836 Family history of other diseases of the respiratory system: Secondary | ICD-10-CM | POA: Insufficient documentation

## 2017-08-20 DIAGNOSIS — G4700 Insomnia, unspecified: Secondary | ICD-10-CM | POA: Insufficient documentation

## 2017-08-20 DIAGNOSIS — Z91018 Allergy to other foods: Secondary | ICD-10-CM | POA: Diagnosis not present

## 2017-08-20 DIAGNOSIS — Z79891 Long term (current) use of opiate analgesic: Secondary | ICD-10-CM | POA: Diagnosis not present

## 2017-08-20 DIAGNOSIS — F17210 Nicotine dependence, cigarettes, uncomplicated: Secondary | ICD-10-CM | POA: Diagnosis not present

## 2017-08-20 MED ORDER — LOSARTAN POTASSIUM 50 MG PO TABS: 50.0000 mg | ORAL_TABLET | Freq: Every day | ORAL | 3 refills | Status: DC

## 2017-08-20 NOTE — Progress Notes (Addendum)
Assessment & Plan:  Perry Norton was seen today for establish care.  Diagnoses and all orders for this visit:  Essential hypertension -     losartan (COZAAR) 50 MG tablet; Take 1 tablet (50 mg total) by mouth daily. Continue all antihypertensives as prescribed.  Remember to bring in your blood pressure log with you for your follow up appointment.  DASH/Mediterranean Diets are healthier choices for HTN.   Tobacco dependence Perry Norton was counseled on the dangers of tobacco use, and was advised to quit. Reviewed strategies to maximize success, including removing cigarettes and smoking materials from environment, stress management and support of family/friends as well as pharmacological alternatives including: Wellbutrin, Chantix, Nicotine patch, Nicotine gum or lozenges. Smoking cessation support: smoking cessation hotline: 1-800-QUIT-NOW.  Smoking cessation classes are also available through [facility]. Call [phone] or visit our website at [email or web address].   Spent 3 minutes counseling on smoking cessation and patient is not ready to quit.  Patient has been counseled on age-appropriate routine health concerns for screening and prevention. These are reviewed and up-to-date. Referrals have been placed accordingly. Immunizations are up-to-date or declined.    Subjective:   Chief Complaint  Patient presents with  . Establish Care    Patient is here to establish care for seizures.    HPI Perry Gingerric Stalvey 42 y.o. male presents to office today to establish care. He has a history of tobacco dependence, seizures (seeing Neurology as instructed), HIV/syphillis (seeing ID as instructed) and hypertension (not currenlty taking any antihypertensive). His affect is very flat today. He only nods his head, shrugs his shoulders and blankly stares at me when I ask him questions. He refused anithypertensive initiation at his last office visit here on 05-17-2017 and would not allow the PA to  check his blood pressure manually. Today I discussed the initiation of losartan for blood pressure reduction. He did ask about the side effects of the medication. I answered his questions to the best of my ability. He states "it doesn't matter anyway. It won't work.  My body will burn it up like it does everything else". When I inquire into his flat affect and depressed mood today he states "I'm always angry. Just leave it at that!" He endorses insomnia and sometimes not sleeping for days. He declines any sleep aids today.   Essential Hypertension His blood pressure is uncontrolled today. This appears to be chronic in nature. He denies chest pain or shortness of breath today. He only shakes his head when I ask if he is experiencing any swelling of his legs or feet.  BP Readings from Last 3 Encounters:  08/20/17 (!) 157/124  07/21/17 (!) 150/76  07/05/17 118/79    Review of Systems  Constitutional: Negative for fever, malaise/fatigue and weight loss.  HENT: Negative.  Negative for nosebleeds.   Eyes: Negative.  Negative for blurred vision, double vision and photophobia.  Respiratory: Negative.  Negative for cough and shortness of breath.   Cardiovascular: Negative.  Negative for chest pain, palpitations and leg swelling.  Gastrointestinal: Negative.  Negative for heartburn, nausea and vomiting.  Musculoskeletal: Negative.  Negative for myalgias.  Neurological: Negative.  Negative for dizziness, focal weakness, seizures and headaches.  Psychiatric/Behavioral: Negative for suicidal ideas. The patient has insomnia.     Past Medical History:  Diagnosis Date  . HIV (human immunodeficiency virus infection) (HCC)   . Seizures (HCC)    epilepsy    History reviewed. No pertinent surgical history.  Family History  Problem Relation Age of Onset  . Sarcoidosis Mother     Social History Reviewed with no changes to be made today.   Outpatient Medications Prior to Visit  Medication Sig  Dispense Refill  . divalproex (DEPAKOTE) 500 MG DR tablet Take 2 tabs in AM, 1/2 tab at noon, 2 tabs in PM 135 tablet 6  . elvitegravir-cobicistat-emtricitabine-tenofovir (STRIBILD) 150-150-200-300 MG TABS tablet Take 1 tablet by mouth daily with breakfast. 30 tablet 5  . GENVOYA 150-150-200-10 MG TABS tablet Take 1 tablet by mouth every morning.  3  . HYDROcodone-acetaminophen (NORCO/VICODIN) 5-325 MG tablet Take 2 tablets by mouth every 4 (four) hours as needed. 10 tablet 0  . lacosamide (VIMPAT) 200 MG TABS tablet Take 1 tablet (200 mg total) by mouth 2 (two) times daily. 60 tablet 5  . methocarbamol (ROBAXIN) 500 MG tablet Take 1 tablet (500 mg total) by mouth 2 (two) times daily. 20 tablet 0  . topiramate (TOPAMAX) 50 MG tablet Take 1/2 tablet twice a day for a week, then increase to 1 tablet twice a day for a week, then increase to 2 tablets twice a day 120 tablet 6  . lidocaine (LIDODERM) 5 % Place 1 patch onto the skin daily. Remove & Discard patch within 12 hours or as directed by MD (Patient not taking: Reported on 08/20/2017) 30 patch 0   No facility-administered medications prior to visit.     Allergies  Allergen Reactions  . Onion Rash       Objective:    BP (!) 157/124 (BP Location: Right Arm, Patient Position: Sitting, Cuff Size: Normal)   Pulse (!) 104   Temp 99.1 F (37.3 C) (Oral)   Ht 6' (1.829 m)   Wt 183 lb (83 kg)   BMI 24.82 kg/m  Wt Readings from Last 3 Encounters:  08/20/17 183 lb (83 kg)  07/21/17 183 lb (83 kg)  07/05/17 183 lb (83 kg)    Physical Exam  Constitutional: He is oriented to person, place, and time. He appears well-developed and well-nourished. He is cooperative.  HENT:  Head: Normocephalic and atraumatic.  Eyes: EOM are normal.  Neck: Normal range of motion.  Cardiovascular: Regular rhythm and normal heart sounds. Tachycardia present. Exam reveals no gallop and no friction rub.  No murmur heard. Pulmonary/Chest: Effort normal and breath  sounds normal. No tachypnea. No respiratory distress. He has no decreased breath sounds. He has no wheezes. He has no rhonchi. He has no rales. He exhibits no tenderness.  Abdominal: Soft. Bowel sounds are normal.  Musculoskeletal: Normal range of motion. He exhibits no edema.  Neurological: He is alert and oriented to person, place, and time. Coordination normal.  Skin: Skin is warm and dry.  Psychiatric: He has a normal mood and affect. His behavior is normal. Judgment and thought content normal.  Nursing note and vitals reviewed.        Patient has been counseled extensively about nutrition and exercise as well as the importance of adherence with medications and regular follow-up. The patient was given clear instructions to go to ER or return to medical center if symptoms don't improve, worsen or new problems develop. The patient verbalized understanding.   Follow-up: Return in about 3 weeks (around 09/10/2017) for BP recheck.   Claiborne Rigg, FNP-BC [facility] [place], [place] [phone]   08/20/2017, 12:48 PM

## 2017-08-20 NOTE — Patient Instructions (Addendum)
Losartan tablets What is this medicine? LOSARTAN (loe SAR tan) is used to treat high blood pressure and to reduce the risk of stroke in certain patients. This drug also slows the progression of kidney disease in patients with diabetes. This medicine may be used for other purposes; ask your health care provider or pharmacist if you have questions. COMMON BRAND NAME(S): Cozaar What should I tell my health care provider before I take this medicine? They need to know if you have any of these conditions: -heart failure -kidney or liver disease -an unusual or allergic reaction to losartan, other medicines, foods, dyes, or preservatives -pregnant or trying to get pregnant -breast-feeding How should I use this medicine? Take this medicine by mouth with a glass of water. Follow the directions on the prescription label. This medicine can be taken with or without food. Take your doses at regular intervals. Do not take your medicine more often than directed. Talk to your pediatrician regarding the use of this medicine in children. Special care may be needed. Overdosage: If you think you have taken too much of this medicine contact a poison control center or emergency room at once. NOTE: This medicine is only for you. Do not share this medicine with others. What if I miss a dose? If you miss a dose, take it as soon as you can. If it is almost time for your next dose, take only that dose. Do not take double or extra doses. What may interact with this medicine? -blood pressure medicines -diuretics, especially triamterene, spironolactone, or amiloride -fluconazole -NSAIDs, medicines for pain and inflammation, like ibuprofen or naproxen -potassium salts or potassium supplements -rifampin This list may not describe all possible interactions. Give your health care provider a list of all the medicines, herbs, non-prescription drugs, or dietary supplements you use. Also tell them if you smoke, drink alcohol, or  use illegal drugs. Some items may interact with your medicine. What should I watch for while using this medicine? Visit your doctor or health care professional for regular checks on your progress. Check your blood pressure as directed. Ask your doctor or health care professional what your blood pressure should be and when you should contact him or her. Call your doctor or health care professional if you notice an irregular or fast heart beat. Women should inform their doctor if they wish to become pregnant or think they might be pregnant. There is a potential for serious side effects to an unborn child, particularly in the second or third trimester. Talk to your health care professional or pharmacist for more information. You may get drowsy or dizzy. Do not drive, use machinery, or do anything that needs mental alertness until you know how this drug affects you. Do not stand or sit up quickly, especially if you are an older patient. This reduces the risk of dizzy or fainting spells. Alcohol can make you more drowsy and dizzy. Avoid alcoholic drinks. Avoid salt substitutes unless you are told otherwise by your doctor or health care professional. Do not treat yourself for coughs, colds, or pain while you are taking this medicine without asking your doctor or health care professional for advice. Some ingredients may increase your blood pressure. What side effects may I notice from receiving this medicine? Side effects that you should report to your doctor or health care professional as soon as possible: -confusion, dizziness, light headedness or fainting spells -decreased amount of urine passed -difficulty breathing or swallowing, hoarseness, or tightening of the throat -fast or   irregular heart beat, palpitations, or chest pain -skin rash, itching -swelling of your face, lips, tongue, hands, or feet Side effects that usually do not require medical attention (report to your doctor or health care  professional if they continue or are bothersome): -cough -decreased sexual function or desire -headache -nasal congestion or stuffiness -nausea or stomach pain -sore or cramping muscles This list may not describe all possible side effects. Call your doctor for medical advice about side effects. You may report side effects to FDA at 1-800-FDA-1088. Where should I keep my medicine? Keep out of the reach of children. Store at room temperature between 15 and 30 degrees C (59 and 86 degrees F). Protect from light. Keep container tightly closed. Throw away any unused medicine after the expiration date. NOTE: This sheet is a summary. It may not cover all possible information. If you have questions about this medicine, talk to your doctor, pharmacist, or health care provider.  2018 Elsevier/Gold Standard (2007-08-19 16:42:18)  

## 2017-08-23 ENCOUNTER — Ambulatory Visit: Payer: PRIVATE HEALTH INSURANCE | Admitting: Infectious Diseases

## 2017-08-24 DIAGNOSIS — M25511 Pain in right shoulder: Secondary | ICD-10-CM | POA: Diagnosis not present

## 2017-09-03 ENCOUNTER — Encounter: Payer: Self-pay | Admitting: Nurse Practitioner

## 2017-09-03 ENCOUNTER — Ambulatory Visit: Payer: Medicaid Other | Admitting: Neurology

## 2017-09-03 ENCOUNTER — Other Ambulatory Visit: Payer: Self-pay

## 2017-09-03 ENCOUNTER — Ambulatory Visit: Payer: Medicaid Other | Attending: Nurse Practitioner | Admitting: Nurse Practitioner

## 2017-09-03 ENCOUNTER — Encounter: Payer: Self-pay | Admitting: Neurology

## 2017-09-03 VITALS — BP 146/84 | HR 118 | Temp 98.6°F | Ht 72.0 in | Wt 189.0 lb

## 2017-09-03 VITALS — BP 108/78 | HR 100 | Ht 72.0 in | Wt 183.0 lb

## 2017-09-03 DIAGNOSIS — I10 Essential (primary) hypertension: Secondary | ICD-10-CM | POA: Insufficient documentation

## 2017-09-03 DIAGNOSIS — G40009 Localization-related (focal) (partial) idiopathic epilepsy and epileptic syndromes with seizures of localized onset, not intractable, without status epilepticus: Secondary | ICD-10-CM

## 2017-09-03 DIAGNOSIS — Z79899 Other long term (current) drug therapy: Secondary | ICD-10-CM | POA: Insufficient documentation

## 2017-09-03 DIAGNOSIS — G40909 Epilepsy, unspecified, not intractable, without status epilepticus: Secondary | ICD-10-CM | POA: Diagnosis not present

## 2017-09-03 DIAGNOSIS — G43019 Migraine without aura, intractable, without status migrainosus: Secondary | ICD-10-CM | POA: Diagnosis not present

## 2017-09-03 DIAGNOSIS — B20 Human immunodeficiency virus [HIV] disease: Secondary | ICD-10-CM | POA: Insufficient documentation

## 2017-09-03 MED ORDER — LOSARTAN POTASSIUM 50 MG PO TABS: 50.0000 mg | ORAL_TABLET | Freq: Every day | ORAL | 3 refills | Status: DC

## 2017-09-03 MED ORDER — TOPIRAMATE 50 MG PO TABS: ORAL_TABLET | ORAL | 6 refills | Status: DC

## 2017-09-03 NOTE — Patient Instructions (Addendum)
Health Risks of Smoking Smoking cigarettes is very bad for your health. Tobacco smoke has over 200 known poisons in it. It contains the poisonous gases nitrogen oxide and carbon monoxide. There are over 60 chemicals in tobacco smoke that cause cancer. Smoking is difficult to quit because a chemical in tobacco, called nicotine, causes addiction or dependence. When you smoke and inhale, nicotine is absorbed rapidly into the bloodstream through your lungs. Both inhaled and non-inhaled nicotine may be addictive. What are the risks of cigarette smoke? Cigarette smokers have an increased risk of many serious medical problems, including:  Lung cancer.  Lung disease, such as pneumonia, bronchitis, and emphysema.  Chest pain (angina) and heart attack because the heart is not getting enough oxygen.  Heart disease and peripheral blood vessel disease.  High blood pressure (hypertension).  Stroke.  Oral cancer, including cancer of the lip, mouth, or voice box.  Bladder cancer.  Pancreatic cancer.  Cervical cancer.  Pregnancy complications, including premature birth.  Stillbirths and smaller newborn babies, birth defects, and genetic damage to sperm.  Early menopause.  Lower estrogen level for women.  Infertility.  Facial wrinkles.  Blindness.  Increased risk of broken bones (fractures).  Senile dementia.  Stomach ulcers and internal bleeding.  Delayed wound healing and increased risk of complications during surgery.  Even smoking lightly shortens your life expectancy by several years.  Because of secondhand smoke exposure, children of smokers have an increased risk of the following:  Sudden infant death syndrome (SIDS).  Respiratory infections.  Lung cancer.  Heart disease.  Ear infections.  What are the benefits of quitting? There are many health benefits of quitting smoking. Here are some of them:  Within days of quitting smoking, your risk of having a heart  attack decreases, your blood flow improves, and your lung capacity improves. Blood pressure, pulse rate, and breathing patterns start returning to normal soon after quitting.  Within months, your lungs may clear up completely.  Quitting for 10 years reduces your risk of developing lung cancer and heart disease to almost that of a nonsmoker.  People who quit may see an improvement in their overall quality of life.  How do I quit smoking? Smoking is an addiction with both physical and psychological effects, and longtime habits can be hard to change. Your health care provider can recommend:  Programs and community resources, which may include group support, education, or talk therapy.  Prescription medicines to help reduce cravings.  Nicotine replacement products, such as patches, gum, and nasal sprays. Use these products only as directed. Do not replace cigarette smoking with electronic cigarettes, which are commonly called e-cigarettes. The safety of e-cigarettes is not known, and some may contain harmful chemicals.  A combination of two or more of these methods.  Where to find more information:  American Lung Association: www.lung.org  American Cancer Society: www.cancer.org Summary  Smoking cigarettes is very bad for your health. Cigarette smokers have an increased risk of many serious medical problems, including several cancers, heart disease, and stroke.  Smoking is an addiction with both physical and psychological effects, and longtime habits can be hard to change.  By stopping right away, you can greatly reduce the risk of medical problems for you and your family.  To help you quit smoking, your health care provider can recommend programs, community resources, prescription medicines, and nicotine replacement products such as patches, gum, and nasal sprays. This information is not intended to replace advice given to you by your health   care provider. Make sure you discuss any  questions you have with your health care provider. Document Released: 07/16/2004 Document Revised: 06/12/2016 Document Reviewed: 06/12/2016 Elsevier Interactive Patient Education  2017 [employer]  Preventing Hypertension Hypertension, commonly called high blood pressure, is when the force of blood pumping through the arteries is too strong. Arteries are blood vessels that carry blood from the heart throughout the body. Over time, hypertension can damage the arteries and decrease blood flow to important parts of the body, including the brain, heart, and kidneys. Often, hypertension does not cause symptoms until blood pressure is very high. For this reason, it is important to have your blood pressure checked on a regular basis. Hypertension can often be prevented with diet and lifestyle changes. If you already have hypertension, you can control it with diet and lifestyle changes, as well as medicine. What nutrition changes can be made? Maintain a healthy diet. This includes:  Eating less salt (sodium). Ask your health care provider how much sodium is safe for you to have. The general recommendation is to consume less than 1 tsp (2,300 mg) of sodium a day. ? Do not add salt to your food. ? Choose low-sodium options when grocery shopping and eating out.  Limiting fats in your diet. You can do this by eating low-fat or fat-free dairy products and by eating less red meat.  Eating more fruits, vegetables, and whole grains. Make a goal to eat: ? 1-2 cups of fresh fruits and vegetables each day. ? 3-4 servings of whole grains each day.  Avoiding foods and beverages that have added sugars.  Eating fish that contain healthy fats (omega-3 fatty acids), such as mackerel or salmon.  If you need help putting together a healthy eating plan, try the DASH diet. This diet is high in fruits, vegetables, and whole grains. It is low in sodium, red meat, and added sugars. DASH stands for Dietary Approaches to  Stop Hypertension. What lifestyle changes can be made?  Lose weight if you are overweight. Losing just 3?5% of your body weight can help prevent or control hypertension. ? For example, if your present weight is 200 lb (91 kg), a loss of 3-5% of your weight means losing 6-10 lb (2.7-4.5 kg). ? Ask your health care provider to help you with a diet and exercise plan to safely lose weight.  Get enough exercise. Do at least 150 minutes of moderate-intensity exercise each week. ? You could do this in short exercise sessions several times a day, or you could do longer exercise sessions a few times a week. For example, you could take a brisk 10-minute walk or bike ride, 3 times a day, for 5 days a week.  Find ways to reduce stress, such as exercising, meditating, listening to music, or taking a yoga class. If you need help reducing stress, ask your health care provider.  Do not smoke. This includes e-cigarettes. Chemicals in tobacco and nicotine products raise your blood pressure each time you smoke. If you need help quitting, ask your health care provider.  Avoid alcohol. If you drink alcohol, limit alcohol intake to no more than 1 drink a day for nonpregnant women and 2 drinks a day for men. One drink equals 12 oz of beer, 5 oz of wine, or 1 oz of hard liquor. Why are these changes important? Diet and lifestyle changes can help you prevent hypertension, and they may make you feel better overall and improve your quality of life. If you have  hypertension, making these changes will help you control it and help prevent major complications, such as:  Hardening and narrowing of arteries that supply blood to: ? Your heart. This can cause a heart attack. ? Your brain. This can cause a stroke. ? Your kidneys. This can cause kidney failure.  Stress on your heart muscle, which can cause heart failure.  What can I do to lower my risk?  Work with your health care provider to make a hypertension prevention  plan that works for you. Follow your plan and keep all follow-up visits as told by your health care provider.  Learn how to check your blood pressure at home. Make sure that you know your personal target blood pressure, as told by your health care provider. How is this treated? In addition to diet and lifestyle changes, your health care provider may recommend medicines to help lower your blood pressure. You may need to try a few different medicines to find what works best for you. You also may need to take more than one medicine. Take over-the-counter and prescription medicines only as told by your health care provider. Where to find support: Your health care provider can help you prevent hypertension and help you keep your blood pressure at a healthy level. Your local hospital or your community may also provide support services and prevention programs. The American Heart Association offers an online support network at: https://www.lee.net/ Where to find more information: Learn more about hypertension from:  National Heart, Lung, and Blood Institute: https://www.peterson.org/  Centers for Disease Control and Prevention: AboutHD.co.nz  American Academy of Family Physicians: http://familydoctor.org/familydoctor/en/diseases-conditions/high-blood-pressure.printerview.all.html  Learn more about the DASH diet from:  National Heart, Lung, and Blood Institute: WedMap.it  Contact a health care provider if:  You think you are having a reaction to medicines you have taken.  You have recurrent headaches or feel dizzy.  You have swelling in your ankles.  You have trouble with your vision. Summary  Hypertension often does not cause any symptoms until blood pressure is very high. It is important to get your blood pressure checked regularly.  Diet and lifestyle changes are the most important  steps in preventing hypertension.  By keeping your blood pressure in a healthy range, you can prevent complications like heart attack, heart failure, stroke, and kidney failure.  Work with your health care provider to make a hypertension prevention plan that works for you. This information is not intended to replace advice given to you by your health care provider. Make sure you discuss any questions you have with your health care provider. Document Released: 06/23/2015 Document Revised: 02/17/2016 Document Reviewed: 02/17/2016 Elsevier Interactive Patient Education  2018 [employer].  Steps to Quit Smoking Smoking tobacco can be harmful to your health and can affect almost every organ in your body. Smoking puts you, and those around you, at risk for developing many serious chronic diseases. Quitting smoking is difficult, but it is one of the best things that you can do for your health. It is never too late to quit. What are the benefits of quitting smoking? When you quit smoking, you lower your risk of developing serious diseases and conditions, such as:  Lung cancer or lung disease, such as COPD.  Heart disease.  Stroke.  Heart attack.  Infertility.  Osteoporosis and bone fractures.  Additionally, symptoms such as coughing, wheezing, and shortness of breath may get better when you quit. You may also find that you get sick less often because your body is  stronger at fighting off colds and infections. If you are pregnant, quitting smoking can help to reduce your chances of having a baby of low birth weight. How do I get ready to quit? When you decide to quit smoking, create a plan to make sure that you are successful. Before you quit:  Pick a date to quit. Set a date within the next two weeks to give you time to prepare.  Write down the reasons why you are quitting. Keep this list in places where you will see it often, such as on your bathroom mirror or in your car or  wallet.  Identify the people, places, things, and activities that make you want to smoke (triggers) and avoid them. Make sure to take these actions: ? Throw away all cigarettes at home, at work, and in your car. ? Throw away smoking accessories, such as Set designerashtrays and lighters. ? Clean your car and make sure to empty the ashtray. ? Clean your home, including curtains and carpets.  Tell your family, friends, and coworkers that you are quitting. Support from your loved ones can make quitting easier.  Talk with your health care provider about your options for quitting smoking.  Find out what treatment options are covered by your health insurance.  What strategies can I use to quit smoking? Talk with your healthcare provider about different strategies to quit smoking. Some strategies include:  Quitting smoking altogether instead of gradually lessening how much you smoke over a period of time. Research shows that quitting "cold [place]key" is more successful than gradually quitting.  Attending in-person counseling to help you build problem-solving skills. You are more likely to have success in quitting if you attend several counseling sessions. Even short sessions of 10 minutes can be effective.  Finding resources and support systems that can help you to quit smoking and remain smoke-free after you quit. These resources are most helpful when you use them often. They can include: ? Online chats with a Veterinary surgeoncounselor. ? Telephone quitlines. ? Automotive engineerrinted self-help materials. ? Support groups or group counseling. ? Text messaging programs. ? Mobile phone applications.  Taking medicines to help you quit smoking. (If you are pregnant or breastfeeding, talk with your health care provider first.) Some medicines contain nicotine and some do not. Both types of medicines help with cravings, but the medicines that include nicotine help to relieve withdrawal symptoms. Your health care provider may  recommend: ? Nicotine patches, gum, or lozenges. ? Nicotine inhalers or sprays. ? Non-nicotine medicine that is taken by mouth.  Talk with your health care provider about combining strategies, such as taking medicines while you are also receiving in-person counseling. Using these two strategies together makes you more likely to succeed in quitting than if you used either strategy on its own. If you are pregnant or breastfeeding, talk with your health care provider about finding counseling or other support strategies to quit smoking. Do not take medicine to help you quit smoking unless told to do so by your health care provider. What things can I do to make it easier to quit? Quitting smoking might feel overwhelming at first, but there is a lot that you can do to make it easier. Take these important actions:  Reach out to your family and friends and ask that they support and encourage you during this time. Call telephone quitlines, reach out to support groups, or work with a counselor for support.  Ask people who smoke to avoid smoking around you.  Avoid  places that trigger you to smoke, such as bars, parties, or smoke-break areas at work.  Spend time around people who do not smoke.  Lessen stress in your life, because stress can be a smoking trigger for some people. To lessen stress, try: ? Exercising regularly. ? Deep-breathing exercises. ? Yoga. ? Meditating. ? Performing a body scan. This involves closing your eyes, scanning your body from head to toe, and noticing which parts of your body are particularly tense. Purposefully relax the muscles in those areas.  Download or purchase mobile phone or tablet apps (applications) that can help you stick to your quit plan by providing reminders, tips, and encouragement. There are many free apps, such as QuitGuide from the [employer] developer for Disease Control and Prevention). You can find other support for quitting smoking (smoking cessation) through  smokefree.gov and other websites.  How will I feel when I quit smoking? Within the first 24 hours of quitting smoking, you may start to feel some withdrawal symptoms. These symptoms are usually most noticeable 2-3 days after quitting, but they usually do not last beyond 2-3 weeks. Changes or symptoms that you might experience include:  Mood swings.  Restlessness, anxiety, or irritation.  Difficulty concentrating.  Dizziness.  Strong cravings for sugary foods in addition to nicotine.  Mild weight gain.  Constipation.  Nausea.  Coughing or a sore throat.  Changes in how your medicines work in your body.  A depressed mood.  Difficulty sleeping (insomnia).  After the first 2-3 weeks of quitting, you may start to notice more positive results, such as:  Improved sense of smell and taste.  Decreased coughing and sore throat.  Slower heart rate.  Lower blood pressure.  Clearer skin.  The ability to breathe more easily.  Fewer sick days.  Quitting smoking is very challenging for most people. Do not get discouraged if you are not successful the first time. Some people need to make many attempts to quit before they achieve long-term success. Do your best to stick to your quit plan, and talk with your health care provider if you have any questions or concerns. This information is not intended to replace advice given to you by your health care provider. Make sure you discuss any questions you have with your health care provider. Document Released: 06/02/2001 Document Revised: 02/04/2016 Document Reviewed: 10/23/2014 Elsevier Interactive Patient Education  [employer].

## 2017-09-03 NOTE — Progress Notes (Signed)
NEUROLOGY FOLLOW UP OFFICE NOTE  Perry Norton [id number] 01/26/1976  HISTORY OF PRESENT ILLNESS: Perry Norton was seen in follow-up in the neurology clinic on 09/03/2017.  The patient was last seen 2 months ago for seizures and migraines. He is alone in the office today. Records and images were personally reviewed where available.  I personally reviewed MRI brain with and without contrast done 08/03/17 which did not show any acute changes, hippocampi symmetric with no abnormal signal or enhancement seen. A prior EEG had shown right anterior temporal epileptiform discharges and slowing. He continued to have seizures on Depakote and Vimpat, Topamax was added on his last visit. He is again sullen and uncooperative during today's visit. Throughout the visit, he is watching a video on his phone and would not answer questions when asked, such as if he is having any numbness/tingling with the Topamax. He keeps repeating "I am sweating like hell." He is taking Topamax 50mg  2 tabs BID in addition to Depakote 2250mg  daily dose and Vimpat 200mg  BID. He was upset that his wife was called after his last visit to inform her that he had passed out as he was checking out and walked out of the office despite repeat advise to stop driving. He states the last time he passed out was on his visit last 07/21/17. When asked about headaches, he answers "you don't want to know." When questioned further, he reports they are "all day everyday."   HPI 07/21/2017: This is a 42 yo RH man with a history of HIV, chronic right arm/shoulder/neck pain, and seizures since 2016 presenting to establish care. He is quite unhappy in the office today and states that he is "always angry." [place], records were reviewed. He started having seizures in June 2016. His wife witnessed it and reported he suddenly froze in place, was unresponsive, then began to foam at the mouth and dropped to the floor with 1-2 minutes of  tonic-clonic activity. He was febrile on admission, MRI brain, CSF studies were negative. He had a routine and 1-hour EEG which were normal. He was started on Keppra, which "made me a super a--hole." He had been taking gabapentin for the pain and dose was increased. He was having both nocturnal seizures and daytime seizures. He was referred to a tertiary epilepsy center where he had an abnormal 24-hour EEG with intermittent spike and slow wave discharges in the right anterior temporal region with maxima at F8 more in sleep and occasional right temporal polymorphic theta range slowing. He was started on Depakote to help with mood as well. Depakote dose was uptitrated, he has been taking 1000mg  in AM, 250mg  at noon, 1000mg  in PM but continued to have seizures a couple times a week. [facility] 2018 for several nocturnal seizures. Depakote level was 104. Vimpat was added but he did not fill the prescription until another ER visit in November 2018 for left shoulder pain presumably from seizures. Xray showed moderate left AC separation. Depakote level was <10, he was unable to pick up Depakote from his pharmacy. He reports taking his medications regularly, he is on Depakote 1000-250-1000mg  and Vimpat 200mg  BID, but states these are not helping. No side effects. He had a seizure on Saturday, then one yesterday. No tongue bite or incontinence. He denies any warning prior to the seizures, but is concerned that it takes him 30 minutes to come back to baseline where in the  past it took only a minute. No focal weakness. He denies any gaps in time, staring/unresponsive episodes, olfactory/gustatory hallucinations, myoclonic jerks. He drinks Christiane Ha on a daily basis to numb the right-sided pain. He states he is "still pissed off because of the pain, he has too much of an attitude and liquor helps some." His wife has told him he needs to curb his temper because this can set off his seizures. He is unsure if  sleep is a trigger.  He also started having headaches when the seizures started. He reports daily headaches lasting 30 minutes to a couple of hours, mostly over the left temporal region radiating behind his left ear. He feels a pounding pain like someone is hitting him with a hammer. He is sensitive to lights, no nausea/vomiting. Tylenol and Advil were not helpful so he does not take anything. He was previously on hydrocodone for the right shoulder/arm pain, and is upset that he has difficulty finding a doctor to prescribe this for him. He saw Ortho and reports muscle relaxants are not helping. The right-sided pain started in 2012, he slipped on stairs and landed on his neck and shoulder. Since then he has had right arm numbness. He can move his fingers but has difficulty lifting his arm up due to the pain. He has pain in his neck, shoulder, down to his mid-back region, described as a squeezing and turning pain. Legs are unaffected. He had an EMG/NCV which was normal. MRI cervical spine in 2016 was normal. MRI of right shoulder in 2014 showed mild nonspecific bone marrow edema in distal portion of right clavicle and acromion, otherwise normal.   Prior AEDs: Keppra, Gabapentin (for pain)  Epilepsy Risk Factors:  He had a normal birth and early development.  There is no history of febrile convulsions, CNS infections such as meningitis/encephalitis, significant traumatic brain injury, neurosurgical procedures, or family history of seizures.  PAST MEDICAL HISTORY: Past Medical History:  Diagnosis Date  . HIV (human immunodeficiency virus infection) (HCC)   . Seizures (HCC)    epilepsy    MEDICATIONS: Current Outpatient Medications on File Prior to Visit  Medication Sig Dispense Refill  . divalproex (DEPAKOTE) 500 MG DR tablet Take 2 tabs in AM, 1/2 tab at noon, 2 tabs in PM 135 tablet 6  . elvitegravir-cobicistat-emtricitabine-tenofovir (STRIBILD) 150-150-200-300 MG TABS tablet Take 1 tablet by  mouth daily with breakfast. 30 tablet 5  . GENVOYA 150-150-200-10 MG TABS tablet Take 1 tablet by mouth every morning.  3  . HYDROcodone-acetaminophen (NORCO/VICODIN) 5-325 MG tablet Take 2 tablets by mouth every 4 (four) hours as needed. 10 tablet 0  . lacosamide (VIMPAT) 200 MG TABS tablet Take 1 tablet (200 mg total) by mouth 2 (two) times daily. 60 tablet 5  . lidocaine (LIDODERM) 5 % Place 1 patch onto the skin daily. Remove & Discard patch within 12 hours or as directed by MD (Patient not taking: Reported on 08/20/2017) 30 patch 0  . losartan (COZAAR) 50 MG tablet Take 1 tablet (50 mg total) by mouth daily. 90 tablet 3  . methocarbamol (ROBAXIN) 500 MG tablet Take 1 tablet (500 mg total) by mouth 2 (two) times daily. 20 tablet 0  . topiramate (TOPAMAX) 50 MG tablet Take 1/2 tablet twice a day for a week, then increase to 1 tablet twice a day for a week, then increase to 2 tablets twice a day 120 tablet 6   No current facility-administered medications on file prior to visit.  ALLERGIES: Allergies  Allergen Reactions  . Onion Rash    FAMILY HISTORY: Family History  Problem Relation Age of Onset  . Sarcoidosis Mother     SOCIAL HISTORY: Social History   Socioeconomic History  . Marital status: Single    Spouse name: Not on file  . Number of children: Not on file  . Years of education: Not on file  . Highest education level: Not on file  Social Needs  . Financial resource strain: Not on file  . Food insecurity - worry: Not on file  . Food insecurity - inability: Not on file  . Transportation needs - medical: Not on file  . Transportation needs - non-medical: Not on file  Occupational History  . Not on file  Tobacco Use  . Smoking status: Current Some Day Smoker    Packs/day: 1.00    Types: Cigarettes    Start date: 06/22/1994  . Smokeless tobacco: Never Used  Substance and Sexual Activity  . Alcohol use: Yes    Alcohol/week: 1.8 oz    Types: 3 Cans of beer per week      Comment: occ  . Drug use: Yes    Types: Marijuana    Comment: denies use 05/03/17  . Sexual activity: Not on file  Other Topics Concern  . Not on file  Social History Narrative  . Not on file    REVIEW OF SYSTEMS unable to obtain, patient uncooperative with visit  PHYSICAL EXAM: Vitals:   09/03/17 1321  BP: 108/78  Pulse: 100  SpO2: 100%   General: No acute distress, on phone watching video during entire visit except when doing exam Head:  Normocephalic/atraumatic Heart:  Regular rate and rhythm Lungs:  Clear to auscultation bilaterally Skin/Extremities: No rash, no edema Neurological Exam: alert and oriented to person, place, and time. No aphasia or dysarthria. Fund of knowledge is appropriate.  Recent and remote memory are intact.  Attention and concentration are normal.    Able to name objects and repeat phrases. Cranial nerves: Pupils equal, round, reactive to light.  Extraocular movements intact with no nystagmus. Visual fields full. Facial sensation intact. No facial asymmetry. Motor: reports pain, unable to do formal muscle testing, moves all extremities symmetrically, no pronator drift.  Sensation to light touch intact.  No extinction to double simultaneous stimulation.  Finger to nose testing intact.  Gait narrow-based and steady, able to tandem walk adequately.    IMPRESSION: This is a 42 yo RH man with a history of right temporal lobe epilepsy, chronic right arm/shoulder/neck pain, headaches with migrainous features. MRI brain normal. Prior EEG in [place] reported right anterior temporal epileptiform discharges and slowing. He continued to have seizures on Depakote 2250mg  daily dose and Vimpat 200mg  BID, Topamax was added on his last visit. He reports increased sweating on 100mg  BID dose, and was instructed to reduce dose to 50mg  BID.   He is again quite sullen and angry during today's visit, he had previously reported this is his baseline, however he was uncooperative  during today's visit, watching a video on the phone while I was speaking to him and refusing to answer direct questions. It is quite difficult to get a good history or any helpful information to be able to help him better. He was upset about our office calling his wife to inform her he had passed out on his last visit and he should not drive. I again reiterated [place] driving laws of no driving for 6 months after  an episode of loss of consciousness/seizure. He is quite uncooperative today, I discussed perhaps seeing a different neurologist would benefit him better, otherwise he may schedule follow-up in 6 months.     Patrcia Dolly, M.D.   CC: Bertram Denver, NP

## 2017-09-03 NOTE — Patient Instructions (Addendum)
1. Reduce Topamax 50mg : Take 1 tablet twice a day 2. Continue Depakote and Vimpat as prescribed 3. Follow-up in 6 months, call for any changes  Seizure Precautions: 1. If medication has been prescribed for you to prevent seizures, take it exactly as directed.  Do not stop taking the medicine without talking to your doctor first, even if you have not had a seizure in a long time.   2. Avoid activities in which a seizure would cause danger to yourself or to others.  Don't operate dangerous machinery, swim alone, or climb in high or dangerous places, such as on ladders, roofs, or girders.  Do not drive unless your doctor says you may.  3. If you have any warning that you may have a seizure, lay down in a safe place where you can't hurt yourself.    4.  No driving for 6 months from last seizure, [place] per [facility] [place].   Please refer to the following link on the Epilepsy Foundation of America's website for more information: http://www.epilepsyfoundation.org/answerplace/Social/driving/drivingu.cfm   5.  Maintain good sleep hygiene. Avoid alcohol.  6.  Contact your doctor if you have any problems that may be related to the medicine you are taking.  7.  Call 911 and bring the patient back to the ED if:        A.  The seizure lasts longer than 5 minutes.       B.  The patient doesn't awaken shortly after the seizure  C.  The patient has new problems such as difficulty seeing, speaking or moving  D.  The patient was injured during the seizure  E.  The patient has a temperature over 102 F (39C)  F.  The patient vomited and now is having trouble breathing

## 2017-09-03 NOTE — Progress Notes (Signed)
Assessment & Plan:  Perry Norton was seen today for blood pressure check.  Diagnoses and all orders for this visit:  Essential hypertension -     losartan (COZAAR) 50 MG tablet; Take 1 tablet (50 mg total) by mouth daily. Continue all antihypertensives as prescribed.  Remember to bring in your blood pressure log with you for your follow up appointment.  DASH/Mediterranean Diets are healthier choices for HTN.    Patient has been counseled on age-appropriate routine health concerns for screening and prevention. These are reviewed and up-to-date. Referrals have been placed accordingly. Immunizations are up-to-date or declined.    Subjective:   Chief Complaint  Patient presents with  . Blood Pressure Check    Patient is here for blood pressure re-check.    HPI Perry Norton 42 y.o. male presents to office today for BP recheck.   Essential Hypertension Patient's last office visit with me was 08/20/2017.  At that time his blood pressure was markedly elevated and he was started on losartan 50 mg daily.  Hypertension is not a new finding for him however he had repeatedly refused antihypertensives in the past. Denies chest pain, shortness of breath, palpitations, lightheadedness, dizziness, headaches or BLE edema.  He continues to smoke and declines smoking cessation aids today. His blood pressure is elevated today however he was seen at the neurologist's office today and his blood pressure was normal. He endorses medication compliance.   BP Readings from Last 3 Encounters:  09/03/17 (!) 146/84  09/03/17 108/78  08/20/17 (!) 157/124    Review of Systems  Constitutional: Negative for fever, malaise/fatigue and weight loss.  HENT: Negative.  Negative for nosebleeds.   Eyes: Negative.  Negative for blurred vision, double vision and photophobia.  Respiratory: Negative.  Negative for cough and shortness of breath.   Cardiovascular: Negative.  Negative for chest pain, palpitations and leg swelling.    Gastrointestinal: Negative.  Negative for heartburn, nausea and vomiting.  Musculoskeletal: Negative.  Negative for myalgias.  Neurological: Negative.  Negative for dizziness, focal weakness, seizures and headaches.  Psychiatric/Behavioral: Negative for suicidal ideas. The patient has insomnia.        "Anger issues"    Past Medical History:  Diagnosis Date  . HIV (human immunodeficiency virus infection) (HCC)   . Seizures (HCC)    epilepsy    History reviewed. No pertinent surgical history.  Family History  Problem Relation Age of Onset  . Sarcoidosis Mother     Social History Reviewed with no changes to be made today.   Outpatient Medications Prior to Visit  Medication Sig Dispense Refill  . divalproex (DEPAKOTE) 500 MG DR tablet Take 2 tabs in AM, 1/2 tab at noon, 2 tabs in PM 135 tablet 6  . elvitegravir-cobicistat-emtricitabine-tenofovir (STRIBILD) 150-150-200-300 MG TABS tablet Take 1 tablet by mouth daily with breakfast. 30 tablet 5  . GENVOYA 150-150-200-10 MG TABS tablet Take 1 tablet by mouth every morning.  3  . lacosamide (VIMPAT) 200 MG TABS tablet Take 1 tablet (200 mg total) by mouth 2 (two) times daily. 60 tablet 5  . lidocaine (LIDODERM) 5 % Place 1 patch onto the skin daily. Remove & Discard patch within 12 hours or as directed by MD 30 patch 0  . methocarbamol (ROBAXIN) 500 MG tablet Take 1 tablet (500 mg total) by mouth 2 (two) times daily. 20 tablet 0  . topiramate (TOPAMAX) 50 MG tablet Take 1 tablet twice a day 120 tablet 6  . losartan (COZAAR) 50 MG  tablet Take 1 tablet (50 mg total) by mouth daily. 90 tablet 3  . HYDROcodone-acetaminophen (NORCO/VICODIN) 5-325 MG tablet Take 2 tablets by mouth every 4 (four) hours as needed. (Patient not taking: Reported on 09/03/2017) 10 tablet 0   No facility-administered medications prior to visit.     Allergies  Allergen Reactions  . Onion Rash       Objective:    BP (!) 146/84 (BP Location: Left Arm, Patient  Position: Sitting, Cuff Size: Normal)   Pulse (!) 118   Temp 98.6 F (37 C) (Oral)   Ht 6' (1.829 m)   Wt 189 lb (85.7 kg)   SpO2 100%   BMI 25.63 kg/m  Wt Readings from Last 3 Encounters:  09/03/17 189 lb (85.7 kg)  09/03/17 183 lb (83 kg)  08/20/17 183 lb (83 kg)    Physical Exam  Constitutional: He is oriented to person, place, and time. He appears well-developed and well-nourished. He is cooperative.  HENT:  Head: Normocephalic and atraumatic.  Eyes: EOM are normal.  Neck: Normal range of motion.  Cardiovascular: Normal rate, regular rhythm, normal heart sounds and intact distal pulses. Exam reveals no gallop and no friction rub.  No murmur heard. Pulmonary/Chest: Effort normal and breath sounds normal. No tachypnea. No respiratory distress. He has no decreased breath sounds. He has no wheezes. He has no rhonchi. He has no rales. He exhibits no tenderness.  Abdominal: Soft. Bowel sounds are normal.  Musculoskeletal: Normal range of motion. He exhibits no edema.  Neurological: He is alert and oriented to person, place, and time. Coordination normal.  Skin: Skin is warm and dry.  Psychiatric: He has a normal mood and affect. His behavior is normal. Judgment and thought content normal.  Nursing note and vitals reviewed.        Patient has been counseled extensively about nutrition and exercise as well as the importance of adherence with medications and regular follow-up. The patient was given clear instructions to go to ER or return to medical center if symptoms don't improve, worsen or new problems develop. The patient verbalized understanding.   Follow-up: Return in about 3 weeks (around 09/24/2017) for HTN, BP recheck.   Claiborne RiggZelda W Kenn Rekowski, FNP-BC [facility] [place], [place] [phone]   09/03/2017, 2:17 PM

## 2017-09-07 ENCOUNTER — Other Ambulatory Visit: Payer: Medicaid Other

## 2017-09-09 ENCOUNTER — Ambulatory Visit (HOSPITAL_COMMUNITY)
Admission: EM | Admit: 2017-09-09 | Discharge: 2017-09-09 | Disposition: A | Discharge status: Home / Self Care | Payer: Medicaid Other | Attending: Family Medicine | Admitting: Family Medicine

## 2017-09-09 ENCOUNTER — Encounter (HOSPITAL_COMMUNITY): Payer: Self-pay | Admitting: Emergency Medicine

## 2017-09-09 ENCOUNTER — Other Ambulatory Visit: Payer: Self-pay

## 2017-09-09 DIAGNOSIS — G8929 Other chronic pain: Secondary | ICD-10-CM

## 2017-09-09 DIAGNOSIS — M25511 Pain in right shoulder: Secondary | ICD-10-CM | POA: Diagnosis not present

## 2017-09-09 DIAGNOSIS — M542 Cervicalgia: Secondary | ICD-10-CM

## 2017-09-09 MED ORDER — CYCLOBENZAPRINE HCL 10 MG PO TABS: 10.0000 mg | ORAL_TABLET | Freq: Two times a day (BID) | ORAL | 0 refills | Status: DC | PRN

## 2017-09-09 MED ORDER — MELOXICAM 7.5 MG PO TABS: 7.5000 mg | ORAL_TABLET | Freq: Every day | ORAL | 0 refills | Status: DC

## 2017-09-09 NOTE — Discharge Instructions (Signed)
Meloxicam once a day for the next 10 days. Do not take additional ibuprofen with this. May use flexeril as well, do not take if drinking alcohol or driving as it may cause drowsiness. Please follow up with orthopedics for further evaluation and treatment.

## 2017-09-09 NOTE — ED Triage Notes (Signed)
C/o right shoulder and arm pain with stiff neck. States comes and goes and has been seen at ER for same problem, reoccurrence 48-72 hours

## 2017-09-09 NOTE — ED Notes (Signed)
Pt refused the AVS other than the top page.

## 2017-09-09 NOTE — ED Provider Notes (Signed)
[facility]    CSN: [id number] Arrival date & time: 09/09/17  1243     History   Chief Complaint Chief Complaint  Patient presents with  . Shoulder Pain    HPI Perry Norton is a 42 y.o. male.   Teal presents with complaints of right shoulder and neck pain which has been chronic in nuture, pain starting years ago. States has had multiple evaluations of this in [place] where he moved from last July. Pain has become more severe over the past 4 days without new trauma or injury. He is right handed. 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 Without numbness or tingling. Pain is 10/10. Has been wearing a sling. History of hiv, seizures, htn, chronic renal insufficiency. States has been taking advil which does not help.    ROS per HPI.      Past Medical History:  Diagnosis Date  . HIV (human immunodeficiency virus infection) (HCC)   . Seizures (HCC)    epilepsy    Patient Active Problem List   Diagnosis Date Noted  . Localization-related idiopathic epilepsy and epileptic syndromes with seizures of localized onset, not intractable, without status epilepticus (HCC) 07/22/2017  . Intractable migraine without aura and without status migrainosus 07/22/2017  . Chronic pain syndrome 07/22/2017  . Pleurisy 07/05/2017  . AC separation, type 2, left, initial encounter 05/25/2017  . CRI (chronic renal insufficiency) 05/20/2017  . Syphilis 05/20/2017  . Poor dentition 05/20/2017  . Pharyngeal irritation 12/05/2015  . Wheeze 12/05/2015  . Head trauma 06/10/2015  . 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 Seizure (HCC) 06/10/2015  . Brachial plexopathy 06/07/2014  . Nausea 03/19/2014  . Right arm weakness 03/02/2014  . Disseminated zoster 04/09/2011  . Human immunodeficiency virus (HIV) disease (HCC) 11/06/2010  . Numbness and tingling of right arm 11/06/2010    History reviewed. No pertinent surgical history.     Home Medications    Prior to Admission  medications   Medication Sig Start Date End Date Taking? Authorizing Provider  cyclobenzaprine (FLEXERIL) 10 MG tablet Take 1 tablet (10 mg total) by mouth 2 (two) times daily as needed for muscle spasms. 09/09/17   Georgetta Haber, NP  divalproex (DEPAKOTE) 500 MG DR tablet Take 2 tabs in AM, 1/2 tab at noon, 2 tabs in PM 07/21/17   Van Clines, MD  elvitegravir-cobicistat-emtricitabine-tenofovir (STRIBILD) 150-150-200-300 MG TABS tablet Take 1 tablet by mouth daily with breakfast. 06/03/17   Ginnie Smart, MD  GENVOYA 150-150-200-10 MG TABS tablet Take 1 tablet by mouth every morning. 06/24/17   [provider]  HYDROcodone-acetaminophen (NORCO/VICODIN) 5-325 MG tablet Take 2 tablets by mouth every 4 (four) hours as needed. Patient not taking: Reported on 09/03/2017 07/05/17   Joy, Hillard Danker, PA-C  lacosamide (VIMPAT) 200 MG TABS tablet Take 1 tablet (200 mg total) by mouth 2 (two) times daily. 07/21/17   Van Clines, MD  lidocaine (LIDODERM) 5 % Place 1 patch onto the skin daily. Remove & Discard patch within 12 hours or as directed by MD 07/05/17   Joy, Shawn C, PA-C  losartan (COZAAR) 50 MG tablet Take 1 tablet (50 mg total) by mouth daily. 09/03/17   Claiborne Rigg, NP  meloxicam (MOBIC) 7.5 MG tablet Take 1 tablet (7.5 mg total) by mouth daily. 09/09/17   Georgetta Haber, NP  methocarbamol (ROBAXIN) 500 MG tablet Take 1 tablet (500 mg total) by mouth 2 (two) times daily.  07/05/17   Joy, Hillard Danker, PA-C  topiramate (TOPAMAX) 50 MG tablet Take 1 tablet twice a day 09/03/17   Van Clines, MD    Family History Family History  Problem Relation Age of Onset  . Sarcoidosis Mother     Social History Social History   Tobacco Use  . Smoking status: Current Some Day Smoker    Packs/day: 1.00    Types: Cigarettes    Start date: 06/22/1994  . Smokeless tobacco: Never Used  Substance Use Topics  . Alcohol use: Yes    Alcohol/week: 1.8 oz    Types: 3 Cans of beer per week     Comment: occ  . Drug use: Yes    Types: Marijuana    Comment: denies use 05/03/17     Allergies   Onion   Review of Systems Review of Systems   Physical Exam Triage Vital Signs ED Triage Vitals  Enc Vitals Group     BP 09/09/17 1338 (!) 136/94     Pulse Rate 09/09/17 1338 99     Resp --      Temp 09/09/17 1338 (!) 95.8 F (35.4 C)     Temp Source 09/09/17 1338 Oral     SpO2 09/09/17 1338 100 %     Weight --      Height --      Head Circumference --      Peak Flow --      Pain Score 09/09/17 1333 10     Pain Loc --      Pain Edu? --      Excl. in GC? --    No data found.  Updated Vital Signs BP (!) 136/94 (BP Location: Left Arm)   Pulse 99   Temp (!) 95.8 F (35.4 C) (Oral)   SpO2 100%   Visual Acuity Right Eye Distance:   Left Eye Distance:   Bilateral Distance:    Right Eye Near:   Left Eye Near:    Bilateral Near:     Physical Exam  Constitutional: He is oriented to person, place, and time. He appears well-developed and well-nourished.  Cardiovascular: Normal rate and regular rhythm.  Pulmonary/Chest: Effort normal and breath sounds normal.  Musculoskeletal:       Right shoulder: He exhibits decreased range of motion and pain.       Cervical back: He exhibits decreased range of motion, pain and spasm.  Limited assessment of shoulder and neck due to patient unable to cooperate due to pain; patient becomes upset even with touching to assess radial pulses- strong; pain with rotation of head to the right; blunted ROM to shoulder; patient will not move elbow or wrist; sensation intact  Neurological: He is alert and oriented to person, place, and time.  Skin: Skin is warm and dry.     UC Treatments / Results  Labs (all labs ordered are listed, but only abnormal results are displayed) Labs Reviewed - No data to display  EKG  EKG Interpretation None       Radiology No results found.  Procedures Procedures (including critical care  time)  Medications Ordered in UC Medications - No data to display   Initial Impression / Assessment and Plan / UC Course  I have reviewed the triage vital signs and the nursing notes.  Pertinent labs & imaging results that were available during my care of the patient were reviewed by me and considered in my medical decision making (see chart for  details).     Offered meloxicam x10 days as noted chronic kidney disease. flexeril as needed. Patient does become upset during exam due to pain and states that he has had to have hydrocodone in the past for pain. Chronic pain. Recommended exercises and limited sling use to prevent worsening of shoulder pain. Continue to follow with PCP and with orthopedics for long term management. Patient unsatisfied stating he has tried all of these treatments and they do not work.   Final Clinical Impressions(s) / UC Diagnoses   Final diagnoses:  Chronic right shoulder pain  Neck pain    ED Discharge Orders        Ordered    cyclobenzaprine (FLEXERIL) 10 MG tablet  2 times daily PRN     09/09/17 1448    meloxicam (MOBIC) 7.5 MG tablet  Daily     09/09/17 1448       Controlled Substance Prescriptions [place] Controlled Substance Registry consulted? Not Applicable   Georgetta HaberBurky, Natalie B, NP 09/09/17 1457

## 2017-09-14 ENCOUNTER — Ambulatory Visit: Payer: Medicaid Other | Admitting: Nurse Practitioner

## 2017-09-15 ENCOUNTER — Ambulatory Visit: Payer: Medicaid Other | Admitting: Neurology

## 2017-09-21 ENCOUNTER — Ambulatory Visit: Payer: Medicaid Other | Admitting: Infectious Diseases

## 2017-10-04 ENCOUNTER — Telehealth: Payer: Self-pay | Admitting: Behavioral Health

## 2017-10-04 NOTE — Telephone Encounter (Signed)
Patient called c/o extreme sweating, vomiting, blood in stool, and a history of seizures.  Patient requesting an appointment with Dr. Ninetta LightsHatcher.  Writer advised patient that Dr. Ninetta LightsHatcher had no appointments today but he should be seen at urgent Care or the ER.  Patient states he is refusing to go because he does not want to wait in the ER.  Patient states, "That will just piss me off and I will have a seizure."  Patient states he refused to go to Urgent care because, "they suck." Writer informed patient he did not have to [facility] but [facility] or [facility].  Patient states he doesn't know where those places are because he moved here a year and a half ago and does not know where anything is.  Patient states he has GPS on his phone and stated he wrote the names down.  Writer reiterated several times he needed to seek further care and patient states, "I'm just going to ride it out until I can see my PCP Monday 10/11/2017."  Patient hung up the phone.  Writer called patient back 2 times and there was no answer.   Angeline SlimAshley Montine Hight RN

## 2017-10-05 ENCOUNTER — Other Ambulatory Visit: Payer: Self-pay

## 2017-10-05 ENCOUNTER — Encounter (HOSPITAL_COMMUNITY): Payer: Self-pay | Admitting: Emergency Medicine

## 2017-10-05 ENCOUNTER — Emergency Department (HOSPITAL_COMMUNITY)
Admission: EM | Admit: 2017-10-05 | Discharge: 2017-10-05 | Discharge status: Against Medical Advice | Payer: Medicaid Other | Attending: Emergency Medicine | Admitting: Emergency Medicine

## 2017-10-05 DIAGNOSIS — Z79899 Other long term (current) drug therapy: Secondary | ICD-10-CM | POA: Diagnosis not present

## 2017-10-05 DIAGNOSIS — G40909 Epilepsy, unspecified, not intractable, without status epilepticus: Secondary | ICD-10-CM | POA: Insufficient documentation

## 2017-10-05 DIAGNOSIS — F17210 Nicotine dependence, cigarettes, uncomplicated: Secondary | ICD-10-CM | POA: Insufficient documentation

## 2017-10-05 LAB — CBC
HEMATOCRIT: 44.3 % (ref 39.0–52.0)
HEMOGLOBIN: 14.8 g/dL (ref 13.0–17.0)
MCH: 29.6 pg (ref 26.0–34.0)
MCHC: 33.4 g/dL (ref 30.0–36.0)
MCV: 88.6 fL (ref 78.0–100.0)
Platelets: 173 10*3/uL (ref 150–400)
RBC: 5 MIL/uL (ref 4.22–5.81)
RDW: 13.5 % (ref 11.5–15.5)
WBC: 6.7 10*3/uL (ref 4.0–10.5)

## 2017-10-05 LAB — COMPREHENSIVE METABOLIC PANEL
ALK PHOS: 63 U/L (ref 38–126)
ALT: 18 U/L (ref 17–63)
ANION GAP: 12 (ref 5–15)
AST: 24 U/L (ref 15–41)
Albumin: 4.4 g/dL (ref 3.5–5.0)
BUN: 11 mg/dL (ref 6–20)
CALCIUM: 9.3 mg/dL (ref 8.9–10.3)
CO2: 23 mmol/L (ref 22–32)
Chloride: 103 mmol/L (ref 101–111)
Creatinine, Ser: 1.45 mg/dL — ABNORMAL HIGH (ref 0.61–1.24)
GFR calc non Af Amer: 59 mL/min — ABNORMAL LOW (ref >60)
Glucose, Bld: 118 mg/dL — ABNORMAL HIGH (ref 65–99)
POTASSIUM: 3.4 mmol/L — AB (ref 3.5–5.1)
SODIUM: 138 mmol/L (ref 135–145)
TOTAL PROTEIN: 7.3 g/dL (ref 6.5–8.1)
Total Bilirubin: 1 mg/dL (ref 0.3–1.2)

## 2017-10-05 LAB — TYPE AND SCREEN
ABO/RH(D): A POS
ANTIBODY SCREEN: NEGATIVE

## 2017-10-05 LAB — VALPROIC ACID LEVEL

## 2017-10-05 LAB — ABO/RH: ABO/RH(D): A POS

## 2017-10-05 MED ORDER — VALPROATE SODIUM 500 MG/5ML IV SOLN
500.0000 mg | Freq: Two times a day (BID) | INTRAVENOUS | Status: DC
  Filled 2017-10-05: qty 5

## 2017-10-05 NOTE — ED Notes (Signed)
Pt refusing IV insertion for medication.

## 2017-10-05 NOTE — ED Notes (Signed)
Pt refusing to wear BP cuff and change into a gown.

## 2017-10-05 NOTE — ED Provider Notes (Signed)
[facility] Provider Note   CSN: [id number] Arrival date & time: 10/05/17  0801     History   Chief Complaint Chief Complaint  Patient presents with  . GI Bleeding  . Seizures    HPI Perry Norton is a 42 y.o. male.  HPI Pt states he had two seizures last night.   He does have a history of seizures.   The last episode he had prior to last night was two weeks ago.  Pt does not miss any medications.   He was concerned about the two seizures.  Pt has noticed he gets a lot of diaphoresis since starting topamax.  No fevers.  No injuries.  No vomiting.  Pt has also noticed blood in his stool the last couple of weeks.   Past Medical History:  Diagnosis Date  . HIV (human immunodeficiency virus infection) (HCC)   . Seizures (HCC)    epilepsy    Patient Active Problem List   Diagnosis Date Noted  . Localization-related idiopathic epilepsy and epileptic syndromes with seizures of localized onset, not intractable, without status epilepticus (HCC) 07/22/2017  . Intractable migraine without aura and without status migrainosus 07/22/2017  . Chronic pain syndrome 07/22/2017  . Pleurisy 07/05/2017  . AC separation, type 2, left, initial encounter 05/25/2017  . CRI (chronic renal insufficiency) 05/20/2017  . Syphilis 05/20/2017  . Poor dentition 05/20/2017  . Pharyngeal irritation 12/05/2015  . Wheeze 12/05/2015  . Head trauma 06/10/2015  . 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 Seizure (HCC) 06/10/2015  . Brachial plexopathy 06/07/2014  . Nausea 03/19/2014  . Right arm weakness 03/02/2014  . Disseminated zoster 04/09/2011  . Human immunodeficiency virus (HIV) disease (HCC) 11/06/2010  . Numbness and tingling of right arm 11/06/2010    History reviewed. No pertinent surgical history.      Home Medications    Prior to Admission medications   Medication Sig Start Date End Date Taking? Authorizing Provider  divalproex (DEPAKOTE) 500 MG DR  tablet Take 2 tabs in AM, 1/2 tab at noon, 2 tabs in PM 07/21/17  Yes Van ClinesAquino, Karen M, MD  GENVOYA 150-150-200-10 MG TABS tablet Take 1 tablet by mouth every morning. 06/24/17  Yes [provider]  lacosamide (VIMPAT) 200 MG TABS tablet Take 1 tablet (200 mg total) by mouth 2 (two) times daily. 07/21/17  Yes Van ClinesAquino, Karen M, MD  losartan (COZAAR) 50 MG tablet Take 1 tablet (50 mg total) by mouth daily. 09/03/17  Yes Claiborne RiggFleming, Zelda W, NP  topiramate (TOPAMAX) 50 MG tablet Take 1 tablet twice a day 09/03/17  Yes Van ClinesAquino, Karen M, MD  cyclobenzaprine (FLEXERIL) 10 MG tablet Take 1 tablet (10 mg total) by mouth 2 (two) times daily as needed for muscle spasms. Patient not taking: Reported on 10/05/2017 09/09/17   Georgetta HaberBurky, Natalie B, NP  elvitegravir-cobicistat-emtricitabine-tenofovir (STRIBILD) 150-150-200-300 MG TABS tablet Take 1 tablet by mouth daily with breakfast. Patient not taking: Reported on 10/05/2017 06/03/17   Ginnie SmartHatcher, Jeffrey C, MD  HYDROcodone-acetaminophen (NORCO/VICODIN) 5-325 MG tablet Take 2 tablets by mouth every 4 (four) hours as needed. Patient not taking: Reported on 09/03/2017 07/05/17   Joy, Ines BloomerShawn C, PA-C  lidocaine (LIDODERM) 5 % Place 1 patch onto the skin daily. Remove & Discard patch within 12 hours or as directed by MD Patient not taking: Reported on 10/05/2017 07/05/17   Joy, Hillard DankerShawn C, PA-C  meloxicam (MOBIC) 7.5 MG tablet Take 1 tablet (7.5 mg total) by mouth daily.  Patient not taking: Reported on 10/05/2017 09/09/17   Linus Mako B, NP  methocarbamol (ROBAXIN) 500 MG tablet Take 1 tablet (500 mg total) by mouth 2 (two) times daily. Patient not taking: Reported on 10/05/2017 07/05/17   Anselm Pancoast, PA-C    Family History Family History  Problem Relation Age of Onset  . Sarcoidosis Mother     Social History Social History   Tobacco Use  . Smoking status: Current Some Day Smoker    Packs/day: 1.00    Types: Cigarettes    Start date: 06/22/1994  . Smokeless tobacco:  Never Used  Substance Use Topics  . Alcohol use: Yes    Alcohol/week: 1.8 oz    Types: 3 Cans of beer per week    Comment: occ  . Drug use: Yes    Types: Marijuana    Comment: denies use 05/03/17     Allergies   Onion   Review of Systems Review of Systems  All other systems reviewed and are negative.    Physical Exam Updated Vital Signs BP (!) 159/109   Pulse 80   Temp 98.1 F (36.7 C) (Oral)   Resp 18   Wt 85.7 kg (189 lb)   SpO2 100%   BMI 25.63 kg/m   Physical Exam  Constitutional: He appears well-developed and well-nourished. No distress.  HENT:  Head: Normocephalic and atraumatic.  Right Ear: External ear normal.  Left Ear: External ear normal.  Eyes: Conjunctivae are normal. Right eye exhibits no discharge. Left eye exhibits no discharge. No scleral icterus.  Neck: Neck supple. No tracheal deviation present.  Cardiovascular: Normal rate, regular rhythm and intact distal pulses.  Pulmonary/Chest: Effort normal and breath sounds normal. No stridor. No respiratory distress. He has no wheezes. He has no rales.  Abdominal: Soft. Bowel sounds are normal. He exhibits no distension. There is no tenderness. There is no rebound and no guarding.  Genitourinary:  Genitourinary Comments: Small external skin tag, no active bleeding  Musculoskeletal: He exhibits no edema or tenderness.  Neurological: He is alert. He has normal strength. No cranial nerve deficit (no facial droop, extraocular movements intact, no slurred speech) or sensory deficit. He exhibits normal muscle tone. He displays no seizure activity. Coordination normal.  Skin: Skin is warm and dry. No rash noted.  Psychiatric: He has a normal mood and affect.  Nursing note and vitals reviewed.    ED Treatments / Results  Labs (all labs ordered are listed, but only abnormal results are displayed) Labs Reviewed  COMPREHENSIVE METABOLIC PANEL - Abnormal; Notable for the following components:      Result  Value   Potassium 3.4 (*)    Glucose, Bld 118 (*)    Creatinine, Ser 1.45 (*)    GFR calc non Af Amer 59 (*)    All other components within normal limits  VALPROIC ACID LEVEL - Abnormal; Notable for the following components:   Valproic Acid Lvl <10 (*)    All other components within normal limits  CBC  POC OCCULT BLOOD, ED  TYPE AND SCREEN  ABO/RH     Procedures Procedures (including critical care time)  Medications Ordered in ED Medications  valproate (DEPACON) 500 mg in dextrose 5 % 50 mL IVPB (500 mg Intravenous Not Given 10/05/17 1154)     Initial Impression / Assessment and Plan / ED Course  I have reviewed the triage vital signs and the nursing notes.  Pertinent labs & imaging results that were available during  my care of the patient were reviewed by me and considered in my medical decision making (see chart for details).  Clinical Course as of Oct 05 1217  Tue Oct 05, 2017  1122 Patient's Depakote level is low.  I will give him a dose IV.   [JK]    Clinical Course User Index [JK] Linwood Dibbles, MD    Pt presented to the ED for recurrent seizures.  Depakote level was low.  I planned to give the pt iV depakote.     No signs of active gi bleeding.  Heme stable.  Pt can follow up as an outpatient.  I went to check on patient but he is no longer in the room.  I was not informed that the patient eloped.    Final Clinical Impressions(s) / ED Diagnoses   Final diagnoses:  Seizure disorder [facility])    ED Discharge Orders    None       Linwood Dibbles, MD 10/05/17 1219

## 2017-10-05 NOTE — ED Triage Notes (Signed)
Pt to ER for evaluation of 2-3 weeks of bright red GI bleeding. Reports witnessed seizures last night states multiple, states wife witnessed. Pt in NAD at this time. Reports is compliant with all seizure medications. Also reports fever last night of 101, states before seizure activity.

## 2017-10-06 ENCOUNTER — Telehealth: Payer: Self-pay | Admitting: Neurology

## 2017-10-06 NOTE — Telephone Encounter (Signed)
Patient called needing to speak with you regarding him Sweating a lot. He said he has to change clothes up to six times a day. He said he has 3 Seizures the past couple days, also last night. He's not sure what is going on or if his medication needs adjusting. Please Call. He said he has also been bleeding from his rectum. Between Sweating a lot and having continuous Seizures he has also had a fever most of those days as well. Please Call.  Thanks

## 2017-10-07 ENCOUNTER — Other Ambulatory Visit: Payer: Self-pay

## 2017-10-07 DIAGNOSIS — R202 Paresthesia of skin: Principal | ICD-10-CM

## 2017-10-07 DIAGNOSIS — R29898 Other symptoms and signs involving the musculoskeletal system: Secondary | ICD-10-CM

## 2017-10-07 DIAGNOSIS — M542 Cervicalgia: Secondary | ICD-10-CM

## 2017-10-07 DIAGNOSIS — G894 Chronic pain syndrome: Secondary | ICD-10-CM

## 2017-10-07 DIAGNOSIS — R200 Anesthesia of skin: Secondary | ICD-10-CM

## 2017-10-07 NOTE — Telephone Encounter (Signed)
Orders placed.

## 2017-10-07 NOTE — Telephone Encounter (Signed)
Returned call to pt.  He states that he has been experiencing excessive sweating and fatigue for a few weeks now.  Also experiencing blood in stool x3 weeks.  States that he just thought everything was going to come and go - but symptoms have not lightened.  Advised per Dr. Karel JarvisAquino (verbally) to to discontinue Topamax, continue Vimpat and Depakote.  Asked if he has reached out to his PCP or Infectious Disease about his rectal bleeding - states that he has appointment with PCP on Monday 10/11/17 and that infectious disease cannot get him in until the end of May.  He also stated that his shoulder and neck pain have gotten a little worse.  Asked that I send referral to different pain clinic.  Will send.

## 2017-10-11 ENCOUNTER — Ambulatory Visit: Payer: Medicaid Other | Attending: Nurse Practitioner | Admitting: Nurse Practitioner

## 2017-10-11 ENCOUNTER — Encounter: Payer: Self-pay | Admitting: Nurse Practitioner

## 2017-10-11 VITALS — BP 137/82 | HR 92 | Temp 98.7°F | Ht 72.0 in | Wt 183.0 lb

## 2017-10-11 DIAGNOSIS — G8929 Other chronic pain: Secondary | ICD-10-CM

## 2017-10-11 DIAGNOSIS — K921 Melena: Secondary | ICD-10-CM | POA: Insufficient documentation

## 2017-10-11 DIAGNOSIS — S43005A Unspecified dislocation of left shoulder joint, initial encounter: Secondary | ICD-10-CM

## 2017-10-11 DIAGNOSIS — Z79899 Other long term (current) drug therapy: Secondary | ICD-10-CM | POA: Diagnosis not present

## 2017-10-11 DIAGNOSIS — M545 Low back pain: Secondary | ICD-10-CM | POA: Insufficient documentation

## 2017-10-11 DIAGNOSIS — L0291 Cutaneous abscess, unspecified: Secondary | ICD-10-CM

## 2017-10-11 DIAGNOSIS — M542 Cervicalgia: Secondary | ICD-10-CM | POA: Insufficient documentation

## 2017-10-11 DIAGNOSIS — M79642 Pain in left hand: Secondary | ICD-10-CM | POA: Diagnosis not present

## 2017-10-11 DIAGNOSIS — M25512 Pain in left shoulder: Secondary | ICD-10-CM | POA: Diagnosis not present

## 2017-10-11 DIAGNOSIS — I10 Essential (primary) hypertension: Secondary | ICD-10-CM | POA: Diagnosis present

## 2017-10-11 DIAGNOSIS — S43006A Unspecified dislocation of unspecified shoulder joint, initial encounter: Secondary | ICD-10-CM

## 2017-10-11 LAB — HEMOCCULT GUIAC POC 1CARD (OFFICE)

## 2017-10-11 NOTE — Patient Instructions (Signed)
Acromioclavicular Separation Acromioclavicular separation is an injury to the small joint at the top of the shoulder (acromioclavicular joint or AC joint). The [facility] joint connects the outer tip of the collarbone (clavicle) to the top of the shoulder blade (acromion). Two strong cords of tissue (acromioclavicular ligament and coracoclavicular ligament) stretch across the [facility] joint to keep it in place. An AC joint separation happens when one or both ligaments stretch or tear, causing the joint to separate. There are six types of separation. The type of separation that you have depends on how much the ligaments are damaged and how far the joint has moved out of place. The less severe types of separation are most common. What are the causes? Common causes of this condition include:  A hard, direct hit (blow) to the top of the shoulder.  Falling on the shoulder.  Falling on an outstretched arm.  What increases the risk? This condition is more likely to develop in male athletes under age 40 who participate in sports that involve potential contact, such as:  Football.  Rugby.  Hockey.  Cycling.  Martial arts.  What are the signs or symptoms?  The main symptom of this condition is shoulder pain. Pain may be mild or severe, depending on the type of separation. Other signs and symptoms in the shoulder may include:  Swelling.  Limited range of motion, especially when moving the arm across the body.  Pain and tenderness when touching the top of the shoulder.  Pain when putting weight on the shoulder, such as rolling on the shoulder while sleeping.  A visible bump (deformity) over the joint.  A clicking or popping sound when moving the shoulder.  How is this diagnosed? This condition may be diagnosed based on:  Your symptoms.  Your medical history, including your history of recent injuries.  A physical exam to check for deformity and limited range of motion.  Imaging tests, such  as: ? X-rays. ? MRI. ? Ultrasound.  How is this treated? Treatment for this condition may include:  Resting the shoulder before gradually returning to normal activities.  Icing the shoulder.  NSAIDs to help reduce pain and swelling.  A sling to support your shoulder and keep it from moving.  Physical therapy.  Surgery. This is rare. Surgery may be needed for severe injuries that include breaks (fractures) in a bone, or injuries that do not get better with nonsurgical treatments. Surgery is followed by keeping your joint in place for a period of time (immobilization) and physical therapy.  Follow these instructions at home: If you have a sling:  Wear the sling as told by your health care provider. Remove it only as told by your health care provider.  Reposition the sling if your fingers tingle, become numb, or turn cold and blue.  Do not let your sling get wet if it is not waterproof. Ask your health care provider if you can remove the sling for bathing and showering.  Keep the sling clean. Managing pain, stiffness, and swelling   If directed, apply ice to the injured area. ? Put ice in a plastic bag. ? Place a towel between your skin and the bag. ? Leave the ice on for 20 minutes, 2-3 times a day.  Move your fingers often to avoid stiffness and to lessen swelling. Driving  Do not drive or operate heavy machinery while taking prescription pain medicine.  Ask your health care provider when it is safe for you to drive. Activity  the bag.  ? Leave the ice on for 20 minutes, 2-3 times a day.  · Move your fingers often to avoid stiffness and to lessen swelling.  Driving  · Do not drive or operate heavy machinery while taking prescription pain medicine.  · Ask your health care provider when it is safe for you to drive.  Activity  · Rest and return to your normal activities as told by your health care provider. Ask your health care provider what activities are safe for you.  · Do exercises as told by your health care provider.  General instructions  · Do not use any tobacco products, such as cigarettes, chewing tobacco, and e-cigarettes. Tobacco can delay healing. If you need help quitting, ask your health care provider.  · Take over-the-counter and prescription medicines only  as told by your health care provider.  · Keep all follow-up visits as told by your health care provider. This is important.  How is this prevented?  · Make sure to use equipment that fits you.  · Wear shoulder padding during contact sports.  · Be safe and responsible while being active to avoid falls.  Contact a health care provider if:  · Your pain and stiffness do not improve after 2 weeks.  This information is not intended to replace advice given to you by your health care provider. Make sure you discuss any questions you have with your health care provider.  Document Released: 06/08/2005 Document Revised: 02/13/2016 Document Reviewed: 05/11/2015  [employer]vier Interactive Patient Education © 2018 [employer]

## 2017-10-11 NOTE — Progress Notes (Signed)
Assessment & Plan:  Travares was seen today for blood pressure check.  Diagnoses and all orders for this visit:  Chronic left shoulder pain -     Ambulatory referral to Orthopedic Surgery May alternate with heat and ice application for pain relief. May also use acetaminophen XS as prescribed for pain.  -     MR Shoulder Left Wo Contrast; Future  Hematochezia -     Hemoccult - 1 Card (office)  Dislocation of shoulder joint -     MR Shoulder Left Wo Contrast; Future  Abscess -     Ambulatory referral to General Surgery    Patient has been counseled on age-appropriate routine health concerns for screening and prevention. These are reviewed and up-to-date. Referrals have been placed accordingly. Immunizations are up-to-date or declined.    Subjective:   Chief Complaint  Patient presents with  . Blood Pressure Check   HPI Perry Norton 42 y.o. male presents to office today for blood pressure recheck and hospital follow up. He endorses intense pain from left shoulder to left hand and also to lower back.  He also endorses increased diaphoresis after starting topimax. Also endorses blood in his stool for the past 3 weeks. He reports drinking Christiane Ha all day long.     Essential Hypertension Chronic. Stable. Endorses medication compliance. Denies chest pain, shortness of breath, palpitations, lightheadedness, dizziness, headaches or BLE edema.  BP Readings from Last 3 Encounters:  10/11/17 137/82  10/05/17 (!) 159/109  09/09/17 (!) 136/94   Left Shoulder Pain Chronic left shoulder and neck pain. Onset several years ago. 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 He has seen orthopedic surgery in the past and was offered Physical therapy. However it does not appear he participated.  His shoulder pain has worsened over the past month. He also has a history of chronic pain and has recently been referred to pain management by neurology. He is currently wearing a left  sling. Patient's pain has been difficult to control. He states no relief of pain with tramadol, tylenol #3, meloxicam, robaxin or flexeril. He refuses lidocaine patch today. States "I will just deal with the pain until you can refer me". Due to severe pain he will likely need an MRI and will need to follow back up with Ortho Dr. Roda Shutters.   Blood in Stool Patient presents for presents evaluation of blood in stool/ rectal bleeding. Patient has associated symptoms of visible blood: blood streaking outside of stool. The patient denies abdominal pain, alternating loose stools and constipation, constipation, diarrhea and loose stools.  The patient has a known history of: NSAID use. The patient has had several episodes of rectal bleeding. There is not a history of rectal injury. Patient has not similar episodes of rectal bleeding in the past.  Seizure He was admitted to the ED on 10-05-2017 with reported seizure activity x2. He endorsed medication compliance taking depakote, Topamax and vimpat. His depakote level was low and he was given IV depakote in the ED. He eloped the ED prior to discharge. He was advised by his neurologist after discharge to stop topamax due to side effects and ocntinue vimpat and depakote.     Review of Systems  Constitutional: Positive for diaphoresis. Negative for fever, malaise/fatigue and weight loss.  HENT: Negative.  Negative for nosebleeds.   Eyes: Negative.  Negative for blurred vision, double vision and photophobia.  Respiratory: Negative.  Negative for cough and shortness of breath.  Cardiovascular: Negative.  Negative for chest pain, palpitations and leg swelling.  Gastrointestinal: Positive for blood in stool. Negative for abdominal pain, constipation, diarrhea, heartburn, melena, nausea and vomiting.  Musculoskeletal: Positive for back pain and joint pain. Negative for myalgias.  Skin:       abscess  Neurological: Positive for seizures. Negative for dizziness, focal  weakness and headaches.  Psychiatric/Behavioral: Negative.  Negative for suicidal ideas.    Past Medical History:  Diagnosis Date  . HIV (human immunodeficiency virus infection) (HCC)   . Seizures (HCC)    epilepsy    History reviewed. No pertinent surgical history.  Family History  Problem Relation Age of Onset  . Sarcoidosis Mother     Social History Reviewed with no changes to be made today.   Outpatient Medications Prior to Visit  Medication Sig Dispense Refill  . divalproex (DEPAKOTE) 500 MG DR tablet Take 2 tabs in AM, 1/2 tab at noon, 2 tabs in PM 135 tablet 6  . GENVOYA 150-150-200-10 MG TABS tablet Take 1 tablet by mouth every morning.  3  . lacosamide (VIMPAT) 200 MG TABS tablet Take 1 tablet (200 mg total) by mouth 2 (two) times daily. 60 tablet 5  . losartan (COZAAR) 50 MG tablet Take 1 tablet (50 mg total) by mouth daily. 90 tablet 3  . topiramate (TOPAMAX) 50 MG tablet Take 1 tablet twice a day 120 tablet 6  . elvitegravir-cobicistat-emtricitabine-tenofovir (STRIBILD) 150-150-200-300 MG TABS tablet Take 1 tablet by mouth daily with breakfast. (Patient not taking: Reported on 10/05/2017) 30 tablet 5  . HYDROcodone-acetaminophen (NORCO/VICODIN) 5-325 MG tablet Take 2 tablets by mouth every 4 (four) hours as needed. (Patient not taking: Reported on 09/03/2017) 10 tablet 0  . cyclobenzaprine (FLEXERIL) 10 MG tablet Take 1 tablet (10 mg total) by mouth 2 (two) times daily as needed for muscle spasms. (Patient not taking: Reported on 10/05/2017) 20 tablet 0  . lidocaine (LIDODERM) 5 % Place 1 patch onto the skin daily. Remove & Discard patch within 12 hours or as directed by MD (Patient not taking: Reported on 10/05/2017) 30 patch 0  . meloxicam (MOBIC) 7.5 MG tablet Take 1 tablet (7.5 mg total) by mouth daily. (Patient not taking: Reported on 10/05/2017) 10 tablet 0  . methocarbamol (ROBAXIN) 500 MG tablet Take 1 tablet (500 mg total) by mouth 2 (two) times daily. (Patient not  taking: Reported on 10/05/2017) 20 tablet 0   No facility-administered medications prior to visit.     Allergies  Allergen Reactions  . Onion Rash       Objective:    BP 137/82 (BP Location: Right Arm, Patient Position: Sitting, Cuff Size: Normal)   Pulse 92   Temp 98.7 F (37.1 C) (Oral)   Ht 6' (1.829 m)   Wt 183 lb (83 kg)   SpO2 99%   BMI 24.82 kg/m  Wt Readings from Last 3 Encounters:  10/11/17 183 lb (83 kg)  10/05/17 189 lb (85.7 kg)  09/03/17 189 lb (85.7 kg)    Physical Exam  Constitutional: He is oriented to person, place, and time. He appears well-developed and well-nourished. He is cooperative.  HENT:  Head: Normocephalic and atraumatic.  Neck: Normal range of motion.  Cardiovascular: Normal rate, regular rhythm and normal heart sounds. Exam reveals no gallop and no friction rub.  No murmur heard. Pulmonary/Chest: Effort normal and breath sounds normal. No tachypnea. No respiratory distress. He has no decreased breath sounds. He has no wheezes. He has no  rhonchi. He has no rales. He exhibits no tenderness.  Abdominal: Soft. Bowel sounds are normal.  Genitourinary: Rectal exam shows guaiac negative stool.  Musculoskeletal: He exhibits no edema.       Left shoulder: He exhibits decreased range of motion and pain.       Left elbow: He exhibits decreased range of motion.  Neurological: He is alert and oriented to person, place, and time. Coordination normal.  Skin: Skin is warm, dry and intact.     Psychiatric: He has a normal mood and affect. His behavior is normal. Judgment and thought content normal.  Nursing note and vitals reviewed.      Patient has been counseled extensively about nutrition and exercise as well as the importance of adherence with medications and regular follow-up. The patient was given clear instructions to go to ER or return to medical center if symptoms don't improve, worsen or new problems develop. The patient verbalized  understanding.   Follow-up: Return in about 3 months (around 01/10/2018).   Claiborne RiggZelda W Fleming, FNP-BC [facility] [place], [place] [phone]   10/11/2017, 11:00 PM

## 2017-10-12 ENCOUNTER — Telehealth: Payer: Self-pay | Admitting: Nurse Practitioner

## 2017-10-12 NOTE — Telephone Encounter (Signed)
Patient called and about an antibiotic that was supposed to be sent please let me know so I can call him back at [phone].

## 2017-10-13 ENCOUNTER — Other Ambulatory Visit: Payer: Self-pay | Admitting: Nurse Practitioner

## 2017-10-13 MED ORDER — DOXYCYCLINE HYCLATE 100 MG PO TABS: 100.0000 mg | ORAL_TABLET | Freq: Two times a day (BID) | ORAL | 0 refills | Status: DC

## 2017-10-15 NOTE — Telephone Encounter (Signed)
Order filled

## 2017-10-18 ENCOUNTER — Ambulatory Visit (INDEPENDENT_AMBULATORY_CARE_PROVIDER_SITE_OTHER): Payer: Medicaid Other | Admitting: Orthopaedic Surgery

## 2017-10-18 ENCOUNTER — Ambulatory Visit (INDEPENDENT_AMBULATORY_CARE_PROVIDER_SITE_OTHER): Payer: Medicaid Other

## 2017-10-18 ENCOUNTER — Encounter (INDEPENDENT_AMBULATORY_CARE_PROVIDER_SITE_OTHER): Payer: Self-pay | Admitting: Orthopaedic Surgery

## 2017-10-18 DIAGNOSIS — M542 Cervicalgia: Secondary | ICD-10-CM | POA: Diagnosis not present

## 2017-10-18 DIAGNOSIS — M79602 Pain in left arm: Secondary | ICD-10-CM

## 2017-10-18 NOTE — Progress Notes (Signed)
Office Visit Note   Patient: Perry Norton           Date of Birth: Mar 11, 1976           MRN: [id number] Visit Date: 10/18/2017              Requested by: Claiborne Rigg, NP [address] [place], [place] [address] PCP: Claiborne Rigg, NP   Assessment & Plan: Visit Diagnoses:  1. Left arm pain   2. Cervicalgia     Plan: At this point, we will reorder the MRI of his left shoulder and add cervical spine MRI.  He will follow-up with [place] once that is completed.  If these are both negative, we will likely refer him to his neurologist who manages his seizures.  He will call with concerns or questions in the meantime.  Follow-Up Instructions: Return in about 2 weeks (around 11/01/2017) for to discuss left shoulder mri.   Orders:  Orders Placed This Encounter  Procedures  . XR Cervical Spine 2 or 3 views  . MR Shoulder Left w/o contrast  . MR Cervical Spine w/o contrast   No orders of the defined types were placed in this encounter.     Procedures: No procedures performed   Clinical Data: No additional findings.   Subjective: Chief Complaint  Patient presents with  . Left Shoulder - Pain  . Neck - Pain    HPI patient is a 42 year old gentleman with a history of left shoulder AC separation type II and chronic pain who presents to our clinic today with continued left shoulder pain.  This began after a fall landing on the left arm back in December 2018.  He was seen in our office where he was diagnosed with a type II AC separation.  He states the pain has not improved.  He also states that approximately 3 to 4 days a week for an hour each time he gets complete loss of feeling to the entire left upper extremity.  When the sensation returns, he has extreme pain.  He wears a sling when this occurs.  Review of Systems as detailed in HPI.  All others reviewed and are negative.   Objective: Vital Signs: There were no vitals taken for this visit.  Physical Exam well-developed  well-nourished gentleman no acute distress.  Alert and oriented x3.  Ortho Exam examination of his left shoulder: He is in a sling and is unable to feel nor move his entire left upper extremity including his fingers.  No bony tenderness as he states he cannot feel his clavicle or AC joint.  Full range of motion of the cervical spine  Specialty Comments:  No specialty comments available.  Imaging: Xr Cervical Spine 2 Or 3 Views  Result Date: 10/18/2017 No structural abnormalities    PMFS History: Patient Active Problem List   Diagnosis Date Noted  . Left arm pain 10/18/2017  . Localization-related idiopathic epilepsy and epileptic syndromes with seizures of localized onset, not intractable, without status epilepticus (HCC) 07/22/2017  . Intractable migraine without aura and without status migrainosus 07/22/2017  . Chronic pain syndrome 07/22/2017  . Pleurisy 07/05/2017  . AC separation, type 2, left, initial encounter 05/25/2017  . CRI (chronic renal insufficiency) 05/20/2017  . Syphilis 05/20/2017  . Poor dentition 05/20/2017  . Pharyngeal irritation 12/05/2015  . Wheeze 12/05/2015  . Head trauma 06/10/2015  . 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 Seizure (HCC) 06/10/2015  . Brachial plexopathy 06/07/2014  .  Nausea 03/19/2014  . Right arm weakness 03/02/2014  . Disseminated zoster 04/09/2011  . Human immunodeficiency virus (HIV) disease (HCC) 11/06/2010  . Numbness and tingling of right arm 11/06/2010   Past Medical History:  Diagnosis Date  . HIV (human immunodeficiency virus infection) (HCC)   . Seizures (HCC)    epilepsy    Family History  Problem Relation Age of Onset  . Sarcoidosis Mother     History reviewed. No pertinent surgical history. Social History   Occupational History  . Not on file  Tobacco Use  . Smoking status: Current Some Day Smoker    Packs/day: 1.00    Types: Cigarettes    Start date: 06/22/1994  . Smokeless tobacco: Never Used  Substance  and Sexual Activity  . Alcohol use: Yes    Alcohol/week: 1.8 oz    Types: 3 Cans of beer per week    Comment: occ  . Drug use: Yes    Types: Marijuana    Comment: denies use 05/03/17  . Sexual activity: Not on file

## 2017-10-25 DIAGNOSIS — K089 Disorder of teeth and supporting structures, unspecified: Secondary | ICD-10-CM

## 2017-10-27 DIAGNOSIS — L723 Sebaceous cyst: Secondary | ICD-10-CM | POA: Diagnosis not present

## 2017-10-27 DIAGNOSIS — L089 Local infection of the skin and subcutaneous tissue, unspecified: Secondary | ICD-10-CM | POA: Diagnosis not present

## 2017-11-04 ENCOUNTER — Other Ambulatory Visit: Payer: Medicaid Other

## 2017-11-09 ENCOUNTER — Telehealth: Payer: Self-pay

## 2017-11-09 ENCOUNTER — Telehealth: Payer: Self-pay | Admitting: Neurology

## 2017-11-09 NOTE — Telephone Encounter (Signed)
Pt called today with some concerns and was wanting to make an appt with Dr. Ninetta Lights. Pt was complaining about some rectal bleeding, neck and shoulder pain, numbness in left shoulder down to finger tips, and coughing some blood. PT stated he felt some similar symptoms when he fell. Pt stated that he saw his PCP that they were unsure what was going on and asked him to f/u with Neurology or his infectious disease Md. Pt is going to see neurology September 30th. Pt is scheduled to see Dr. Ninetta Lights 11/10/17 at 3:45  Lorenso Courier, CMA

## 2017-11-09 NOTE — Telephone Encounter (Signed)
Patient called needing to be seen sooner due to his Seizures and sweating so much. He is scheduled for 03/21/18 and on a wait list but would like to speak with someone about being seen sooner? Please Advise. Thanks

## 2017-11-10 ENCOUNTER — Ambulatory Visit (INDEPENDENT_AMBULATORY_CARE_PROVIDER_SITE_OTHER): Payer: Medicaid Other | Admitting: Infectious Diseases

## 2017-11-10 ENCOUNTER — Telehealth: Payer: Self-pay | Admitting: *Deleted

## 2017-11-10 ENCOUNTER — Encounter: Payer: Self-pay | Admitting: Infectious Diseases

## 2017-11-10 ENCOUNTER — Other Ambulatory Visit (HOSPITAL_COMMUNITY)
Admission: RE | Admit: 2017-11-10 | Discharge: 2017-11-10 | Disposition: A | Discharge status: Home / Self Care | Payer: Medicaid Other | Source: Ambulatory Visit | Attending: Infectious Diseases | Admitting: Infectious Diseases

## 2017-11-10 VITALS — BP 86/45 | HR 140 | Temp 97.8°F | Ht 72.0 in | Wt 171.0 lb

## 2017-11-10 DIAGNOSIS — B20 Human immunodeficiency virus [HIV] disease: Secondary | ICD-10-CM

## 2017-11-10 DIAGNOSIS — G40009 Localization-related (focal) (partial) idiopathic epilepsy and epileptic syndromes with seizures of localized onset, not intractable, without status epilepticus: Secondary | ICD-10-CM

## 2017-11-10 DIAGNOSIS — Z79899 Other long term (current) drug therapy: Secondary | ICD-10-CM

## 2017-11-10 DIAGNOSIS — K089 Disorder of teeth and supporting structures, unspecified: Secondary | ICD-10-CM | POA: Diagnosis not present

## 2017-11-10 DIAGNOSIS — Z113 Encounter for screening for infections with a predominantly sexual mode of transmission: Secondary | ICD-10-CM

## 2017-11-10 NOTE — Assessment & Plan Note (Addendum)
I'd suggest that his diaphoresis is related to one of his new sz meds.  Will check his labs to make sure that he is not having organ injury Will try to get him into neuro sooner.

## 2017-11-10 NOTE — Progress Notes (Signed)
   Subjective:    Patient ID: Perry Norton, male    DOB: 1975-10-18, 42 y.o.   MRN: [id number]  HPI 42 yo M with hx of HIV+, dx 2009. Prev on stribild, updated to genvoya end of 2018.  He also has hx of seizure d/o. He c/o diffuse pain.  Has been having drenching sweats. Daily, and at night. Has to take a shower, change clothes at night.  Fatigued even if resting all day.  Was started on depakote, vimpat and topamax by neuro (06-2017). Feels like this med start was related to his sweats.  He is also on cozaar for BP.   HIV 1 RNA Quant  Date Value  07/05/2017 <20 NOT DETECTED copies/mL  05/12/2017 <20 Copies/mL (H)   CD4 T Cell Abs (/uL)  Date Value  07/05/2017 540  05/12/2017 520    Review of Systems  Constitutional: Positive for diaphoresis and fatigue.  Gastrointestinal: Positive for nausea and vomiting. Negative for constipation and diarrhea.  Genitourinary: Negative for difficulty urinating.  Neurological: Positive for dizziness, seizures and headaches.  occas blood per rectum, occas coughing up blood.  Last sz 3 days ago. Wife found him on floor after he got out of shower.  Please see HPI. All other systems reviewed and negative.     Objective:   Physical Exam  Constitutional: He is oriented to person, place, and time. He appears well-developed and well-nourished.  HENT:  Mouth/Throat: No oropharyngeal exudate.  Eyes: Pupils are equal, round, and reactive to light. EOM are normal.    Neck: Normal range of motion. Neck supple.  Cardiovascular: Normal rate, regular rhythm and normal heart sounds.  Pulmonary/Chest: Effort normal and breath sounds normal.  Abdominal: Soft. Bowel sounds are normal. There is no tenderness. There is no guarding.  Musculoskeletal: He exhibits no edema.  Neurological: He is alert and oriented to person, place, and time.  Skin: Skin is warm and dry.          Assessment & Plan:

## 2017-11-10 NOTE — Assessment & Plan Note (Signed)
Will try to get him into dental.  

## 2017-11-10 NOTE — Telephone Encounter (Signed)
RN alerted to patient's off behavior in lobby after his doctor's visit.  Patient was sitting down with his head in his hands, breathing deeply, perspiring, and focusing on his breathing.  RN asked if I could help, he accepted.  Patient's vital signs were stable (improved from his visit) at 120/81, pulse 80, O2 100%.  He described feeling light headed, "not in control of his body and angry about it."  He states he feels this way often, concentrates on his breathing to "keep from getting angry and out of control."  He states that sometimes these symptoms result in a seizure, but he does not think this will happen today.  Patient stated he drove to today's appointment, does not want me to call anyone to come get him.  He states he has taken all of his medication as directed today, not missed any doses. He ate breakfast, not lunch today. This is normal for him.  He is drinking a cup of cold water. RN asked how I could help him. He stated I could not, that no one could. "I'm just a born asshole." He likens himself to [employer], "always angry" and struggles to keep this in check.  He adamantly refuses an offer for counseling, even immediately to meet with our in-house counselor Rene Kocher. RN sat with patient. He visibly calmed, his breathing returned to normal, he stopped sweating and shaking.  He stated he was "better", finished his water, walked stably out the door, still refusing to have anyone come pick him up. Andree Coss, RN

## 2017-11-10 NOTE — Assessment & Plan Note (Signed)
Will check his labs today.  Will check BCx, ESR Will see him back in 4 weeks.

## 2017-11-11 ENCOUNTER — Telehealth: Payer: Self-pay

## 2017-11-11 NOTE — Telephone Encounter (Signed)
Spoke with pt.  He states that he is still experiencing excessive sweating, he is also tired/drained of all energy all of the time now.  Pt states that he d/c Topamax once we advised him to.  States that is compliant with all other medications.  I let him know that he is on wait list for sooner appointment.

## 2017-11-11 NOTE — Telephone Encounter (Signed)
Thank you Ms Howell! 

## 2017-11-11 NOTE — Telephone Encounter (Signed)
PT called today requesting lab results that he had done yesterday told pt that Md would go over labs with him. PT also stated that he would like a new referral sent for Neurology since his current neurologist wont be able to see him until September 30th. Will inform Dr. Ninetta Lights pt would like to go over labs and that he would like a referral to neurology. Lorenso Courier, [place]

## 2017-11-12 LAB — COMPREHENSIVE METABOLIC PANEL
AG Ratio: 1.9 (calc) (ref 1.0–2.5)
ALBUMIN MSPROF: 4.6 g/dL (ref 3.6–5.1)
ALKALINE PHOSPHATASE (APISO): 59 U/L (ref 40–115)
ALT: 11 U/L (ref 9–46)
AST: 15 U/L (ref 10–40)
BUN/Creatinine Ratio: 9 (calc) (ref 6–22)
BUN: 13 mg/dL (ref 7–25)
CO2: 29 mmol/L (ref 20–32)
CREATININE: 1.37 mg/dL — AB (ref 0.60–1.35)
Calcium: 9.7 mg/dL (ref 8.6–10.3)
Chloride: 103 mmol/L (ref 98–110)
GLUCOSE: 83 mg/dL (ref 65–99)
Globulin: 2.4 g/dL (calc) (ref 1.9–3.7)
POTASSIUM: 3.8 mmol/L (ref 3.5–5.3)
Sodium: 141 mmol/L (ref 135–146)
TOTAL PROTEIN: 7 g/dL (ref 6.1–8.1)
Total Bilirubin: 0.4 mg/dL (ref 0.2–1.2)

## 2017-11-12 LAB — CBC
HCT: 43.1 % (ref 38.5–50.0)
Hemoglobin: 14.8 g/dL (ref 13.2–17.1)
MCH: 29.6 pg (ref 27.0–33.0)
MCHC: 34.3 g/dL (ref 32.0–36.0)
MCV: 86.2 fL (ref 80.0–100.0)
MPV: 12.1 fL (ref 7.5–12.5)
PLATELETS: 171 10*3/uL (ref 140–400)
RBC: 5 10*6/uL (ref 4.20–5.80)
RDW: 13.6 % (ref 11.0–15.0)
WBC: 5.9 10*3/uL (ref 3.8–10.8)

## 2017-11-12 LAB — LIPID PANEL
Cholesterol: 168 mg/dL (ref <200)
HDL: 43 mg/dL (ref >40)
LDL Cholesterol (Calc): 100 mg/dL (calc) — ABNORMAL HIGH
NON-HDL CHOLESTEROL (CALC): 125 mg/dL (ref <130)
TRIGLYCERIDES: 148 mg/dL (ref <150)
Total CHOL/HDL Ratio: 3.9 (calc) (ref <5.0)

## 2017-11-12 LAB — RPR: RPR: REACTIVE — AB

## 2017-11-12 LAB — HIV-1 RNA QUANT-NO REFLEX-BLD
HIV 1 RNA Quant: 53 copies/mL — ABNORMAL HIGH
HIV-1 RNA QUANT, LOG: 1.72 {Log_copies}/mL — AB

## 2017-11-12 LAB — URINE CYTOLOGY ANCILLARY ONLY
Chlamydia: NEGATIVE
Neisseria Gonorrhea: NEGATIVE

## 2017-11-12 LAB — RPR TITER: RPR Titer: 1:32 {titer} — ABNORMAL HIGH

## 2017-11-12 LAB — T-HELPER CELL (CD4) - (RCID CLINIC ONLY)
CD4 % Helper T Cell: 25 % — ABNORMAL LOW (ref 33–55)
CD4 T CELL ABS: 440 /uL (ref 400–2700)

## 2017-11-12 LAB — CORTISOL: CORTISOL PLASMA: 8.9 ug/dL

## 2017-11-12 LAB — TSH: TSH: 0.46 m[IU]/L (ref 0.40–4.50)

## 2017-11-12 LAB — FLUORESCENT TREPONEMAL AB(FTA)-IGG-BLD: Fluorescent Treponemal ABS: REACTIVE — AB

## 2017-11-12 LAB — SEDIMENTATION RATE: Sed Rate: 2 mm/h (ref 0–15)

## 2017-11-16 ENCOUNTER — Telehealth: Payer: Self-pay

## 2017-11-16 NOTE — Telephone Encounter (Signed)
Called Mr Sumlin back.  He is still having sweats. Fatigue, myalgias.  He continues on depakote, vimpat, genvoya. Losartan.  He has stopped topamax.  Has been on losartan for 2 months, had sweats prior.  His TSH was normal and cortisol also normal.  His PCP is Z Flemming. [phone] WIll see if neuro, PCP can see him prior to Sept.

## 2017-11-16 NOTE — Telephone Encounter (Signed)
Pt called today to give his previous ID doctors name and number Dr. Gus Height [phone]. Pt stated that he was a pt of this MD for 20 years and feels like he would be able to coordinate a plan with Dr. Ninetta Lights to get the pt the best possible care at our practice. Will give Dr. Ninetta Lights the pt's message. Perry Norton, [place]

## 2017-11-17 LAB — CULTURE BLOOD MANUAL
Micro Number: 90624214
Micro Number: 90624236
RESULT 123: NO GROWTH
Result: NO GROWTH

## 2017-11-17 NOTE — Telephone Encounter (Signed)
Agree with PCP evaluation for systemic symptoms. He is now off Topamax. He has been on Depakote since 2016 and Vimpat since Nov 2018, but only reporting these symptoms now. He is on our cancellation list. Thank you

## 2017-11-18 ENCOUNTER — Ambulatory Visit: Payer: Medicaid Other | Admitting: Infectious Diseases

## 2017-11-19 ENCOUNTER — Telehealth: Payer: Self-pay

## 2017-11-19 NOTE — Telephone Encounter (Signed)
He has an appointment with me in July. I will have my CMA call him and let him know he can be seen on the walk in schedule with the PA sooner if he is agreeable. Thank you.

## 2017-11-19 NOTE — Telephone Encounter (Signed)
CMA attempt to call patient.  No answer and left a VM for patient to call back.

## 2017-11-19 NOTE — Telephone Encounter (Signed)
Bien please see below. Can we try to get him on Angela's schedule. If he is not agreeable he won't be able to see me until July. Thank you.

## 2017-11-19 NOTE — Telephone Encounter (Signed)
Patient return CMA call. Patient stated he is only willing to see his PCP no other providers.  Patient stated he will keep his appt with PCP.  CMA stated he can call us back next week and see if there are no any other openings for him.  Patient hung up the phone.

## 2017-11-19 NOTE — Telephone Encounter (Signed)
CMA attempt to call patient to ask patient if he would like to be seen by a PA for walk-in appt for a sooner appt or keep his next appt with his PCP in July. No answer and left a VM for patient call back.

## 2017-12-15 ENCOUNTER — Encounter (HOSPITAL_COMMUNITY): Payer: Self-pay

## 2017-12-15 ENCOUNTER — Emergency Department (HOSPITAL_COMMUNITY)
Admission: EM | Admit: 2017-12-15 | Discharge: 2017-12-15 | Disposition: A | Discharge status: Home / Self Care | Payer: Medicaid Other | Attending: Emergency Medicine | Admitting: Emergency Medicine

## 2017-12-15 ENCOUNTER — Emergency Department (HOSPITAL_COMMUNITY): Payer: Medicaid Other

## 2017-12-15 ENCOUNTER — Other Ambulatory Visit: Payer: Self-pay

## 2017-12-15 DIAGNOSIS — Z79899 Other long term (current) drug therapy: Secondary | ICD-10-CM | POA: Insufficient documentation

## 2017-12-15 DIAGNOSIS — R0602 Shortness of breath: Secondary | ICD-10-CM | POA: Insufficient documentation

## 2017-12-15 DIAGNOSIS — Z21 Asymptomatic human immunodeficiency virus [HIV] infection status: Secondary | ICD-10-CM | POA: Insufficient documentation

## 2017-12-15 DIAGNOSIS — R5381 Other malaise: Secondary | ICD-10-CM | POA: Diagnosis not present

## 2017-12-15 DIAGNOSIS — R5383 Other fatigue: Secondary | ICD-10-CM | POA: Diagnosis not present

## 2017-12-15 DIAGNOSIS — R51 Headache: Secondary | ICD-10-CM | POA: Diagnosis not present

## 2017-12-15 DIAGNOSIS — F17210 Nicotine dependence, cigarettes, uncomplicated: Secondary | ICD-10-CM | POA: Diagnosis not present

## 2017-12-15 DIAGNOSIS — R61 Generalized hyperhidrosis: Secondary | ICD-10-CM | POA: Diagnosis not present

## 2017-12-15 LAB — CBC WITH DIFFERENTIAL/PLATELET
ABS IMMATURE GRANULOCYTES: 0 10*3/uL (ref 0.0–0.1)
Basophils Absolute: 0 10*3/uL (ref 0.0–0.1)
Basophils Relative: 1 %
EOS ABS: 0.1 10*3/uL (ref 0.0–0.7)
Eosinophils Relative: 1 %
HCT: 43.1 % (ref 39.0–52.0)
HEMOGLOBIN: 13.8 g/dL (ref 13.0–17.0)
Immature Granulocytes: 0 %
Lymphocytes Relative: 33 %
Lymphs Abs: 1.5 10*3/uL (ref 0.7–4.0)
MCH: 29.2 pg (ref 26.0–34.0)
MCHC: 32 g/dL (ref 30.0–36.0)
MCV: 91.3 fL (ref 78.0–100.0)
MONOS PCT: 9 %
Monocytes Absolute: 0.4 10*3/uL (ref 0.1–1.0)
NEUTROS PCT: 56 %
Neutro Abs: 2.6 10*3/uL (ref 1.7–7.7)
PLATELETS: 181 10*3/uL (ref 150–400)
RBC: 4.72 MIL/uL (ref 4.22–5.81)
RDW: 13.4 % (ref 11.5–15.5)
WBC: 4.6 10*3/uL (ref 4.0–10.5)

## 2017-12-15 LAB — COMPREHENSIVE METABOLIC PANEL
ALK PHOS: 58 U/L (ref 38–126)
ALT: 21 U/L (ref 0–44)
ANION GAP: 6 (ref 5–15)
AST: 27 U/L (ref 15–41)
Albumin: 4.3 g/dL (ref 3.5–5.0)
BILIRUBIN TOTAL: 0.5 mg/dL (ref 0.3–1.2)
BUN: 12 mg/dL (ref 6–20)
CALCIUM: 9 mg/dL (ref 8.9–10.3)
CO2: 29 mmol/L (ref 22–32)
Chloride: 102 mmol/L (ref 98–111)
Creatinine, Ser: 1.61 mg/dL — ABNORMAL HIGH (ref 0.61–1.24)
GFR calc non Af Amer: 52 mL/min — ABNORMAL LOW (ref >60)
Glucose, Bld: 94 mg/dL (ref 70–99)
Potassium: 3.5 mmol/L (ref 3.5–5.1)
Sodium: 137 mmol/L (ref 135–145)
TOTAL PROTEIN: 7 g/dL (ref 6.5–8.1)

## 2017-12-15 LAB — I-STAT TROPONIN, ED: TROPONIN I, POC: 0 ng/mL (ref 0.00–0.08)

## 2017-12-15 LAB — I-STAT CG4 LACTIC ACID, ED: Lactic Acid, Venous: 0.98 mmol/L (ref 0.5–1.9)

## 2017-12-15 LAB — BRAIN NATRIURETIC PEPTIDE: B Natriuretic Peptide: 14.3 pg/mL (ref 0.0–100.0)

## 2017-12-15 LAB — LIPASE, BLOOD: Lipase: 25 U/L (ref 11–51)

## 2017-12-15 NOTE — ED Notes (Signed)
Patient grabbed discharge paperwork when I entered the room and refused to sign discharge paperwork prior to leaving.

## 2017-12-15 NOTE — ED Notes (Signed)
Patient states he has a headache and body aches. Patient refuses gown or any monitor equipment except pulse ox. Patient refuses IV at this time. Patient would like a doctors note and information about what is wrong with him.

## 2017-12-15 NOTE — ED Triage Notes (Signed)
Pt endorses 4 episodes of random sweating and "out of energy" x 4-5 months. Pt trying to get in to see pcp and neurologist but can't until September. Denies shob or CP. Hypertensive. Axox4. Has HIV.

## 2017-12-15 NOTE — Discharge Instructions (Signed)
Follow-up with your primary care doctor in the next 24-48 hours.  Return to the Emergency Department for any chest pain, difficulty breathing, abdominal pain, nausea/vomiting, vision changes, numbness/weakness

## 2017-12-15 NOTE — ED Provider Notes (Signed)
Patient placed in Quick Look pathway, seen and evaluated   Chief Complaint: soaking sweats, night sweats  HPI:   Hx of HIV,  Night sweats for 4 months Takes meds daily, + fatigue and malaise.  + sob. No weight loss, no loss of appetite.  Last viral load 53 Last cd4, 440   ROS: Night sweats (one)  Physical Exam:   Gen: No distress  Neuro: Awake and Alert  Skin: Warm    Focused Exam:  Lungs ctab   Initiation of care has begun. The patient has been counseled on the process, plan, and necessity for staying for the completion/evaluation, and the remainder of the medical screening examination    Arthor CaptainHarris, Lekesha Claw, Cordelia Poche-C 12/15/17 1623    Mesner, Barbara CowerJason, MD 12/16/17 1630

## 2017-12-15 NOTE — ED Provider Notes (Signed)
[facility] Provider Note   CSN: [id number] Arrival date & time: 12/15/17  1536     History   Chief Complaint Chief Complaint  Patient presents with  . Excessive Sweating    HPI Perry Norton is a 42 y.o. male past medical history HIV (last CD4 440), seizures, who presents for evaluation of diaphoresis and pain.  Patient reports that this diaphoresis has been intermittently ongoing for last 4 to 5 months.  He states that these episodes will occur randomly.  He states that he does not have night sweats.  He states that the diaphoresis can be at rest or with exertion there is no triggering or inciting factor.  Patient states that he became fatigued yesterday.  He states that he felt malaise and generalized fatigue.  He states that he sat down because he is feeling so tired.  He states he did not have any LOC, syncopal episode.  He states he did not hit his head.  He states that when he had this episode of fatigue, he did not have any chest pain or any difficulty breathing.  Patient states that he has been able to do his normal amount of activity he states that he just feels fatigued.  He states he is not having any weight loss or appetite loss.  Patient states that he is currently on antiretrovirals for his HIV which he states he has been compliant with.  He follows with ID who he states he saw last month.  Patient reports he has been compliant with his other medications.  She denies any fevers, chest pain, difficulty breathing, vision changes, abdominal pain, nausea/vomiting, numbness/weakness in her legs, dizziness, difficulty ambulating, back pain.  The history is provided by the patient.    Past Medical History:  Diagnosis Date  . HIV (human immunodeficiency virus infection) (HCC)   . Seizures (HCC)    epilepsy    Patient Active Problem List   Diagnosis Date Noted  . Left arm pain 10/18/2017  . Localization-related idiopathic epilepsy and  epileptic syndromes with seizures of localized onset, not intractable, without status epilepticus (HCC) 07/22/2017  . Intractable migraine without aura and without status migrainosus 07/22/2017  . Chronic pain syndrome 07/22/2017  . Pleurisy 07/05/2017  . AC separation, type 2, left, initial encounter 05/25/2017  . CRI (chronic renal insufficiency) 05/20/2017  . Syphilis 05/20/2017  . Poor dentition 05/20/2017  . Pharyngeal irritation 12/05/2015  . Wheeze 12/05/2015  . Head trauma 06/10/2015  . 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 Seizure (HCC) 06/10/2015  . Brachial plexopathy 06/07/2014  . Nausea 03/19/2014  . Right arm weakness 03/02/2014  . Disseminated zoster 04/09/2011  . Human immunodeficiency virus (HIV) disease (HCC) 11/06/2010  . Numbness and tingling of right arm 11/06/2010    History reviewed. No pertinent surgical history.      Home Medications    Prior to Admission medications   Medication Sig Start Date End Date Taking? Authorizing Provider  divalproex (DEPAKOTE) 500 MG DR tablet Take 2 tabs in AM, 1/2 tab at noon, 2 tabs in PM 07/21/17   Van ClinesAquino, Karen M, MD  doxycycline (VIBRA-TABS) 100 MG tablet Take 1 tablet (100 mg total) by mouth 2 (two) times daily. 10/13/17   Claiborne RiggFleming, Zelda W, NP  elvitegravir-cobicistat-emtricitabine-tenofovir (STRIBILD) 150-150-200-300 MG TABS tablet Take 1 tablet by mouth daily with breakfast. Patient not taking: Reported on 10/05/2017 06/03/17   Ginnie SmartHatcher, Jeffrey C, MD  GENVOYA 150-150-200-10 MG TABS tablet Take 1  tablet by mouth every morning. 06/24/17   [provider]  HYDROcodone-acetaminophen (NORCO/VICODIN) 5-325 MG tablet Take 2 tablets by mouth every 4 (four) hours as needed. Patient not taking: Reported on 09/03/2017 07/05/17   Joy, Hillard Danker, PA-C  lacosamide (VIMPAT) 200 MG TABS tablet Take 1 tablet (200 mg total) by mouth 2 (two) times daily. 07/21/17   Van Clines, MD  losartan (COZAAR) 50 MG tablet Take 1 tablet (50 mg  total) by mouth daily. 09/03/17   Claiborne Rigg, NP  topiramate (TOPAMAX) 50 MG tablet Take 1 tablet twice a day 09/03/17   Van Clines, MD    Family History Family History  Problem Relation Age of Onset  . Sarcoidosis Mother     Social History Social History   Tobacco Use  . Smoking status: Current Some Day Smoker    Packs/day: 1.00    Types: Cigarettes    Start date: 06/22/1994  . Smokeless tobacco: Never Used  Substance Use Topics  . Alcohol use: Yes    Alcohol/week: 1.8 oz    Types: 3 Cans of beer per week    Comment: occ  . Drug use: Yes    Types: Marijuana    Comment: denies use 05/03/17     Allergies   Onion   Review of Systems Review of Systems  Constitutional: Positive for diaphoresis and fatigue. Negative for appetite change, fever and unexpected weight change.  Eyes: Negative for visual disturbance.  Respiratory: Negative for cough and shortness of breath.   Cardiovascular: Negative for chest pain.  Gastrointestinal: Negative for abdominal pain, nausea and vomiting.  Genitourinary: Negative for dysuria and hematuria.  Musculoskeletal: Negative for back pain.  Neurological: Negative for weakness, numbness and headaches.  All other systems reviewed and are negative.    Physical Exam Updated Vital Signs BP (!) 148/110 (BP Location: Right Arm)   Pulse 90   Temp 98.2 F (36.8 C) (Oral)   Resp 16   Ht 6' (1.829 m)   Wt 83 kg (183 lb)   SpO2 100%   BMI 24.82 kg/m   Physical Exam  Constitutional: He is oriented to person, place, and time. He appears well-developed and well-nourished.  Sitting comfortably on bed  HENT:  Head: Normocephalic and atraumatic.  Mouth/Throat: Oropharynx is clear and moist and mucous membranes are normal.  Eyes: Pupils are equal, round, and reactive to light. Conjunctivae, EOM and lids are normal.  Neck: Full passive range of motion without pain.  Cardiovascular: Normal rate, regular rhythm, normal heart sounds and  normal pulses. Exam reveals no gallop and no friction rub.  No murmur heard. Pulses:      Radial pulses are 2+ on the right side, and 2+ on the left side.  Pulmonary/Chest: Effort normal and breath sounds normal.  Lungs clear to auscultation bilaterally.  Symmetric chest rise.  No wheezing, rales, rhonchi.  Abdominal: Soft. Normal appearance. There is no tenderness. There is no rigidity and no guarding.  Abdomen is soft, non-distended, non-tender. No rigidity, No guarding. No peritoneal signs.  Musculoskeletal: Normal range of motion.  No tenderness to palpation to bilateral shoulders, clavicles, elbows, and wrists. No deformities or crepitus noted. FROM of BUE without difficulty. No tenderness to palpation to bilateral knees and ankles. No deformities or crepitus noted. FROM of BLE without any difficulty.   Neurological: He is alert and oriented to person, place, and time.  Cranial nerves III-XII intact Follows commands, Moves all extremities  5/5 strength to  BUE and BLE  Sensation intact throughout all major nerve distributions Normal finger to nose. No dysdiadochokinesia. No pronator drift. No gait abnormalities  No slurred speech. No facial droop.   Skin: Skin is warm and dry. Capillary refill takes less than 2 seconds. He is not diaphoretic. No pallor.  Psychiatric: He has a normal mood and affect. His speech is normal.  Nursing note and vitals reviewed.    ED Treatments / Results  Labs (all labs ordered are listed, but only abnormal results are displayed) Labs Reviewed  COMPREHENSIVE METABOLIC PANEL - Abnormal; Notable for the following components:      Result Value   Creatinine, Ser 1.61 (*)    GFR calc non Af Amer 52 (*)    All other components within normal limits  CULTURE, BLOOD (ROUTINE X 2)  CULTURE, BLOOD (ROUTINE X 2)  LIPASE, BLOOD  CBC WITH DIFFERENTIAL/PLATELET  BRAIN NATRIURETIC PEPTIDE  I-STAT CG4 LACTIC ACID, ED  I-STAT TROPONIN, ED     EKG None  Radiology Dg Chest 2 View  Result Date: 12/15/2017 CLINICAL DATA:  Night sweats and fatigue.  History of HIV. EXAM: CHEST - 2 VIEW COMPARISON:  Chest x-ray dated July 05, 2017. FINDINGS: The heart size and mediastinal contours are within normal limits. Both lungs are clear. The visualized skeletal structures are unremarkable. IMPRESSION: No active cardiopulmonary disease. Electronically Signed   By: Obie Dredge M.D.   On: 12/15/2017 17:35    Procedures Procedures (including critical care time)  Medications Ordered in ED Medications - No data to display   Initial Impression / Assessment and Plan / ED Course  I have reviewed the triage vital signs and the nursing notes.  Pertinent labs & imaging results that were available during my care of the patient were reviewed by me and considered in my medical decision making (see chart for details).     42 year old male with past medical history of HIV (last CD4 440), seizures who presents for evaluation of diaphoresis over the last 4 to 5 months.  Reports this intermittent and is not incited by an triggering factor.  Does not report night sweats, fevers, weight loss, appetite changes.  Reports since yesterday has felt fatigued and generalized malaise.  That yesterday at work, he got very tired.  Patient states he stumbled dropped to his knees but states that he was able to get up and sit down on a chair.  Did not have any syncopal episode. No LOC.  Did not have any chest pain or difficulty breathing.  Denies any complaints at this time. Patient is afebrile, non-toxic appearing, sitting comfortably on examination table. Vital signs reviewed and stable.  Patient is slightly hypertensive.  Review of records show that he has been hypertensive here in the past.  No neuro deficits noted on exam.  On my evaluation, patient is resting comfortably on examination table.  No evidence of diaphoresis or pallor noted.  He has no complaints at  this time.  On initial evaluation, patient refused to be hooked up to the cardiac monitor.  I explained to him that monitoring his vital signs was critical to his work-up as we would need to know his vital signs throughout his ED course.  Patient stated "they are to got them upfront and he will need to do anymore."  Patient appeared very irritated and agitated but stated that nothing was wrong.  Patient dated that he just wanted to know what had already been done so that he can  get out of here and leave.    Troponin negative.  BMP is unremarkable.  Lactic acid is unremarkable.  Lipase is unremarkable.  CMP shows slight bump in creatinine at 1.61.  Review of records show that he is previously at 1.4.  CBC is without any significant leukocytosis, anemia.  Chest x-ray negative for any acute infectious etiology.  EKG shows normal sinus rhythm with LVH.  Review of records from the previous EKG had LVH.   Discussed results with patient.  Again attempted to explain the need for vital signs the patient refused to be hooked up to any monitoring.  Patient states that he does not want any further work-up at this time.  I explained to him that his creatinine was slightly elevated which could indicate dehydration.  Offered IV fluids and further evaluation of his symptoms but patient refused any other further work-up here in the ED.  He states that he does not want to stay any longer and does not wish to have any further treatment offered here in the ED.  At this time, there is no evidence of any acute life-threatening emergency.  Strict patient to follow-up with his primary care doctor for further evaluation. Patient had ample opportunity for questions and discussion. All patient's questions were answered with full understanding. Strict return precautions discussed. Patient expresses understanding and agreement to plan.    Final Clinical Impressions(s) / ED Diagnoses   Final diagnoses:  Excessive sweating  Other  fatigue    ED Discharge Orders    None       Rosana Hoes 12/16/17 0036    Rolland Porter, MD 12/17/17 [phone]

## 2017-12-20 LAB — CULTURE, BLOOD (ROUTINE X 2)
Culture: NO GROWTH
Special Requests: ADEQUATE

## 2018-01-03 ENCOUNTER — Other Ambulatory Visit: Payer: Self-pay | Admitting: Pharmacist

## 2018-01-03 ENCOUNTER — Telehealth: Payer: Self-pay | Admitting: Nurse Practitioner

## 2018-01-03 DIAGNOSIS — B20 Human immunodeficiency virus [HIV] disease: Secondary | ICD-10-CM

## 2018-01-03 MED ORDER — ELVITEG-COBIC-EMTRICIT-TENOFAF 150-150-200-10 MG PO TABS: 1.0000 | ORAL_TABLET | Freq: Every day | ORAL | 2 refills | Status: DC

## 2018-01-03 MED ORDER — ELVITEG-COBIC-EMTRICIT-TENOFDF 150-150-200-300 MG PO TABS: 1.0000 | ORAL_TABLET | Freq: Every day | ORAL | 2 refills | Status: DC

## 2018-01-03 NOTE — Telephone Encounter (Signed)
Patient called and requested for a nurse to  Give him a call back regarding his paperwork so he wouldn't have to explain it twice. Please fu at your earliest convenience [phone].

## 2018-01-03 NOTE — Telephone Encounter (Signed)
CMA spoke to patient regarding his concerns. CMA asked patient if he can drop off the paperwork to the clinic and have PCP to look at it and estimate when she can fill it out by.  Pt. Stated if he was going to drop it off, he would need her to fill it out right away. CMA inform patient it may not be fill out on the same day. Pt. Hung up right after.

## 2018-01-05 ENCOUNTER — Other Ambulatory Visit: Payer: Medicaid Other

## 2018-01-06 ENCOUNTER — Other Ambulatory Visit (HOSPITAL_COMMUNITY)
Admission: RE | Admit: 2018-01-06 | Discharge: 2018-01-06 | Disposition: A | Discharge status: Home / Self Care | Payer: Medicaid Other | Source: Ambulatory Visit | Attending: Infectious Diseases | Admitting: Infectious Diseases

## 2018-01-06 ENCOUNTER — Other Ambulatory Visit: Payer: Medicaid Other

## 2018-01-06 ENCOUNTER — Ambulatory Visit: Payer: Medicaid Other | Attending: Family Medicine | Admitting: Physician Assistant

## 2018-01-06 VITALS — BP 100/80 | HR 98 | Temp 97.8°F | Resp 18 | Ht 72.0 in | Wt 163.0 lb

## 2018-01-06 DIAGNOSIS — L749 Eccrine sweat disorder, unspecified: Secondary | ICD-10-CM

## 2018-01-06 DIAGNOSIS — Z21 Asymptomatic human immunodeficiency virus [HIV] infection status: Secondary | ICD-10-CM | POA: Insufficient documentation

## 2018-01-06 DIAGNOSIS — G40909 Epilepsy, unspecified, not intractable, without status epilepticus: Secondary | ICD-10-CM | POA: Diagnosis not present

## 2018-01-06 DIAGNOSIS — B20 Human immunodeficiency virus [HIV] disease: Secondary | ICD-10-CM

## 2018-01-06 DIAGNOSIS — K625 Hemorrhage of anus and rectum: Secondary | ICD-10-CM | POA: Insufficient documentation

## 2018-01-06 DIAGNOSIS — Z09 Encounter for follow-up examination after completed treatment for conditions other than malignant neoplasm: Secondary | ICD-10-CM | POA: Diagnosis not present

## 2018-01-06 DIAGNOSIS — R61 Generalized hyperhidrosis: Secondary | ICD-10-CM | POA: Insufficient documentation

## 2018-01-06 DIAGNOSIS — Z79899 Other long term (current) drug therapy: Secondary | ICD-10-CM | POA: Insufficient documentation

## 2018-01-06 DIAGNOSIS — Z8619 Personal history of other infectious and parasitic diseases: Secondary | ICD-10-CM | POA: Diagnosis not present

## 2018-01-06 DIAGNOSIS — Z113 Encounter for screening for infections with a predominantly sexual mode of transmission: Secondary | ICD-10-CM

## 2018-01-06 LAB — COMPLETE METABOLIC PANEL WITH GFR
AG Ratio: 1.8 (calc) (ref 1.0–2.5)
ALBUMIN MSPROF: 5.1 g/dL (ref 3.6–5.1)
ALKALINE PHOSPHATASE (APISO): 79 U/L (ref 40–115)
ALT: 18 U/L (ref 9–46)
AST: 26 U/L (ref 10–40)
BILIRUBIN TOTAL: 0.6 mg/dL (ref 0.2–1.2)
BUN / CREAT RATIO: 14 (calc) (ref 6–22)
BUN: 23 mg/dL (ref 7–25)
CO2: 28 mmol/L (ref 20–32)
CREATININE: 1.68 mg/dL — AB (ref 0.60–1.35)
Calcium: 10 mg/dL (ref 8.6–10.3)
Chloride: 98 mmol/L (ref 98–110)
GFR, EST AFRICAN AMERICAN: 58 mL/min/{1.73_m2} — AB (ref >=60)
GFR, Est Non African American: 50 mL/min/{1.73_m2} — ABNORMAL LOW (ref >=60)
Globulin: 2.8 g/dL (calc) (ref 1.9–3.7)
Glucose, Bld: 104 mg/dL — ABNORMAL HIGH (ref 65–99)
Potassium: 3.6 mmol/L (ref 3.5–5.3)
Sodium: 136 mmol/L (ref 135–146)
TOTAL PROTEIN: 7.9 g/dL (ref 6.1–8.1)

## 2018-01-06 LAB — CBC
HEMATOCRIT: 45 % (ref 38.5–50.0)
HEMOGLOBIN: 15 g/dL (ref 13.2–17.1)
MCH: 29.5 pg (ref 27.0–33.0)
MCHC: 33.3 g/dL (ref 32.0–36.0)
MCV: 88.4 fL (ref 80.0–100.0)
MPV: 11.2 fL (ref 7.5–12.5)
Platelets: 203 10*3/uL (ref 140–400)
RBC: 5.09 10*6/uL (ref 4.20–5.80)
RDW: 14.1 % (ref 11.0–15.0)
WBC: 6.5 10*3/uL (ref 3.8–10.8)

## 2018-01-06 NOTE — Progress Notes (Signed)
Patient ID: Perry Norton, male   DOB: 11/06/1975, 42 y.o.   MRN: [id number]       Dream Nodal, is a 42 y.o. male  MWU:[id number]  VOZ:[id number]  DOB - July 20, 1975  Subjective:  Chief Complaint and HPI: Perry Norton is a 42 y.o. male here today with several month history of sweating and at times bleeding from his rectum with a BM. He says both have been going on for months.  It appears he has seen ID and has been seen in the ED for sweating.  He has never seen GI.  He says the sweating is all day every day and all over his body. Upon chart review, I notice there are consistently +RPR.  He says he has had 3 shots of penicillin and an antibiotic course of doxycycline PO.  He has f/up with ID in 2 weeks.  Says he is compliant on HIV meds.    Seen in ED 12/15/2017: 42 year old male with past medical history of HIV (last CD4 440), seizures who presents for evaluation of diaphoresis over the last 4 to 5 months.  Reports this intermittent and is not incited by an triggering factor.  Does not report night sweats, fevers, weight loss, appetite changes.  Reports since yesterday has felt fatigued and generalized malaise.  That yesterday at work, he got very tired.  Patient states he stumbled dropped to his knees but states that he was able to get up and sit down on a chair.  Did not have any syncopal episode. No LOC.  Did not have any chest pain or difficulty breathing.  Denies any complaints at this time. Patient is afebrile, non-toxic appearing, sitting comfortably on examination table. Vital signs reviewed and stable.  Patient is slightly hypertensive.  Review of records show that he has been hypertensive here in the past.  No neuro deficits noted on exam.  On my evaluation, patient is resting comfortably on examination table.  No evidence of diaphoresis or pallor noted.  He has no complaints at this time.  On initial evaluation, patient refused to be hooked up to the cardiac monitor.  I explained to him that  monitoring his vital signs was critical to his work-up as we would need to know his vital signs throughout his ED course.  Patient stated "they are to got them upfront and he will need to do anymore."  Patient appeared very irritated and agitated but stated that nothing was wrong.  Patient dated that he just wanted to know what had already been done so that he can get out of here and leave.    Troponin negative.  BMP is unremarkable.  Lactic acid is unremarkable.  Lipase is unremarkable.  CMP shows slight bump in creatinine at 1.61.  Review of records show that he is previously at 1.4.  CBC is without any significant leukocytosis, anemia.  Chest x-ray negative for any acute infectious etiology.  EKG shows normal sinus rhythm with LVH.  Review of records from the previous EKG had LVH.   Discussed results with patient.  Again attempted to explain the need for vital signs the patient refused to be hooked up to any monitoring.  Patient states that he does not want any further work-up at this time.  I explained to him that his creatinine was slightly elevated which could indicate dehydration.  Offered IV fluids and further evaluation of his symptoms but patient refused any other further work-up here in the ED.  He states that he does  not want to stay any longer and does not wish to have any further treatment offered here in the ED.  At this time, there is no evidence of any acute life-threatening emergency.  Strict patient to follow-up with his primary care doctor for further evaluation. Patient had ample opportunity for questions and discussion. All patient's questions were answered with full understanding. Strict return precautions discussed. Patient expresses understanding and agreement to plan.   ED/Hospital notes reviewed.   Social History: Family history:  ROS:   Constitutional:  No f/c, No night sweats, No unexplained weight loss. EENT:  No vision changes, No blurry vision, No hearing changes. No  mouth, throat, or ear problems.  Respiratory: No cough, No SOB Cardiac: No CP, no palpitations GI:  No abd pain, No N/V/D. GU: No Urinary s/sx Musculoskeletal: No joint pain Neuro: No headache, no dizziness, no motor weakness.  Skin: No rash Endocrine:  No polydipsia. No polyuria.  Psych: Denies SI/HI  No problems updated.  ALLERGIES: Allergies  Allergen Reactions  . Onion Rash    PAST MEDICAL HISTORY: Past Medical History:  Diagnosis Date  . HIV (human immunodeficiency virus infection) (HCC)   . Seizures (HCC)    epilepsy    MEDICATIONS AT HOME: Prior to Admission medications   Medication Sig Start Date End Date Taking? Authorizing Provider  divalproex (DEPAKOTE) 500 MG DR tablet Take 2 tabs in AM, 1/2 tab at noon, 2 tabs in PM 07/21/17  Yes Van Clines, MD  elvitegravir-cobicistat-emtricitabine-tenofovir (GENVOYA) 150-150-200-10 MG TABS tablet Take 1 tablet by mouth daily with breakfast. 01/03/18  Yes Ginnie Smart, MD  lacosamide (VIMPAT) 200 MG TABS tablet Take 1 tablet (200 mg total) by mouth 2 (two) times daily. 07/21/17  Yes Van Clines, MD  losartan (COZAAR) 50 MG tablet Take 1 tablet (50 mg total) by mouth daily. 09/03/17  Yes Claiborne Rigg, NP  topiramate (TOPAMAX) 50 MG tablet Take 1 tablet twice a day 09/03/17  Yes Van Clines, MD     Objective:  EXAM:   Vitals:   01/06/18 0933  BP: 100/80  Pulse: 98  Resp: 18  Temp: 97.8 F (36.6 C)  TempSrc: Oral  SpO2: 100%  Weight: 163 lb (73.9 kg)  Height: 6' (1.829 m)    General appearance : A&OX3. NAD. Non-toxic-appearing.  He does not make eye contact.  Arms folded.  Not receptive to many questions.   HEENT: Atraumatic and Normocephalic.  PERRLA. EOM intact.  TM clear B. Mouth-MMM, post pharynx WNL w/o erythema, No PND. Neck: supple, no JVD. No cervical lymphadenopathy. No thyromegaly Chest/Lungs:  Breathing-non-labored, Good air entry bilaterally, breath sounds normal without rales,  rhonchi, or wheezing  CVS: S1 S2 regular, no murmurs, gallops, rubs  Abdomen: Bowel sounds present, Non tender and not distended with no gaurding, rigidity or rebound. Extremities: Bilateral Lower Ext shows no edema, both legs are warm to touch with = pulse throughout Neurology:  CN II-XII grossly intact, Non focal.   Psych:  No eye contact.  Flat affect.  Mood seems aggravated/angry but denies being angry.   Skin:  No Rash  Data Review No results found for: HGBA1C   Assessment & Plan   1. Sweating abnormality Other labs, cortisol, etc normal overall so far.  - Thyroid Panel With TSH - Vitamin D, 25-hydroxy  2. BRBPR (bright red blood per rectum) This has been going on several months; not acute - CBC with Differential/Platelet - Ambulatory referral to Gastroenterology  3. Asymptomatic HIV infection (HCC) Continue current medications  4. History of syphilis F/up with ID.  He has been treated-titers being followed  5. Hospital follow-up visit-reviewed and summarized notes and labs above.   Patient have been counseled extensively about nutrition and exercise  Return for keep next appt with Bertram DenverZelda Fleming next month.  The patient was given clear instructions to go to ER or return to medical center if symptoms don't improve, worsen or new problems develop. The patient verbalized understanding. The patient was told to call to get lab results if they haven't heard anything in the next week.     Georgian CoAngela Khristopher Kapaun, PA-C [facility] [place], [place] [phone]   01/06/2018, 9:44 AM

## 2018-01-06 NOTE — Patient Instructions (Signed)
Keep f/up with Dr Ninetta LightsHatcher

## 2018-01-07 ENCOUNTER — Other Ambulatory Visit: Payer: Self-pay | Admitting: Physician Assistant

## 2018-01-07 LAB — CBC WITH DIFFERENTIAL/PLATELET
BASOS ABS: 0 10*3/uL (ref 0.0–0.2)
Basos: 1 %
EOS (ABSOLUTE): 0.1 10*3/uL (ref 0.0–0.4)
EOS: 1 %
HEMATOCRIT: 44.8 % (ref 37.5–51.0)
HEMOGLOBIN: 15.1 g/dL (ref 13.0–17.7)
Immature Grans (Abs): 0 10*3/uL (ref 0.0–0.1)
Immature Granulocytes: 0 %
LYMPHS ABS: 2.9 10*3/uL (ref 0.7–3.1)
Lymphs: 45 %
MCH: 29.7 pg (ref 26.6–33.0)
MCHC: 33.7 g/dL (ref 31.5–35.7)
MCV: 88 fL (ref 79–97)
MONOCYTES: 9 %
Monocytes Absolute: 0.6 10*3/uL (ref 0.1–0.9)
NEUTROS ABS: 2.8 10*3/uL (ref 1.4–7.0)
Neutrophils: 44 %
Platelets: 218 10*3/uL (ref 150–450)
RBC: 5.09 x10E6/uL (ref 4.14–5.80)
RDW: 15.1 % (ref 12.3–15.4)
WBC: 6.4 10*3/uL (ref 3.4–10.8)

## 2018-01-07 LAB — THYROID PANEL WITH TSH
FREE THYROXINE INDEX: 2.4 (ref 1.2–4.9)
T3 Uptake Ratio: 30 % (ref 24–39)
T4, Total: 8.1 ug/dL (ref 4.5–12.0)
TSH: 2.08 u[IU]/mL (ref 0.450–4.500)

## 2018-01-07 LAB — URINE CYTOLOGY ANCILLARY ONLY
Chlamydia: NEGATIVE
NEISSERIA GONORRHEA: NEGATIVE

## 2018-01-07 LAB — VITAMIN D 25 HYDROXY (VIT D DEFICIENCY, FRACTURES): Vit D, 25-Hydroxy: 18.7 ng/mL — ABNORMAL LOW (ref 30.0–100.0)

## 2018-01-07 LAB — T-HELPER CELL (CD4) - (RCID CLINIC ONLY)
CD4 % Helper T Cell: 25 % — ABNORMAL LOW (ref 33–55)
CD4 T Cell Abs: 750 /uL (ref 400–2700)

## 2018-01-07 MED ORDER — VITAMIN D (ERGOCALCIFEROL) 1.25 MG (50000 UNIT) PO CAPS: 50000.0000 [IU] | ORAL_CAPSULE | ORAL | 0 refills | Status: DC

## 2018-01-08 LAB — HIV-1 RNA QUANT-NO REFLEX-BLD
HIV 1 RNA QUANT: NOT DETECTED {copies}/mL
HIV-1 RNA Quant, Log: <1.3 Log copies/mL

## 2018-01-10 ENCOUNTER — Ambulatory Visit: Payer: Medicaid Other | Admitting: Nurse Practitioner

## 2018-01-11 ENCOUNTER — Encounter: Payer: Self-pay | Admitting: *Deleted

## 2018-01-11 ENCOUNTER — Telehealth: Payer: Self-pay | Admitting: *Deleted

## 2018-01-11 NOTE — Telephone Encounter (Signed)
-----   Message from Anders SimmondsAngela M McClung, [place]-C sent at 01/07/2018 11:45 AM EDT ----- Please call patient and let them that their vitamin D is low.  This can contribute to muscle aches, anxiety, fatigue, and depression.  I have sent a prescription to the pharmacy for them to take once a week.  We will recheck this level in 3-4 months.  His thyroid studies and blood counts look good.  Follow-up as planned. Thanks, Georgian CoAngela McClung, PA-C

## 2018-01-11 NOTE — Telephone Encounter (Signed)
Patient verified DOB Patient is aware of vitamin d level being low and this can contribute to aches and anxiety. Patient denies having anxiety or depression. Patient advised to pick up the script and begin taking the weekly supplements. Patient is also aware of thyroid and blood count being normal and to follow up as planned. Patient inquired about a state form needing to be completed. Patient is aware of turn around time being 7-14 days. No further questions.

## 2018-01-11 NOTE — Progress Notes (Signed)
Patient reported to the office with paperwork in hand, requesting it be completed. MA spoke with patient and retrieved paperwork. Patient was not in distress but asked for a sip of water.  Patient was buzzed through for water. MA completed the paperwork, made a copy and returned the original to the patient.  MA asked patient if he wanted another sip of water before leaving. Patient obliged and drank again. During that time patient began to rub his eyes and shook his head vigorously. MA asked patient if he wanted a cup of water to take with him. Patient declined. MA asked patient if he wanted a cup of water and to wait in the lobby for a bit. Patient declined.  Patient let himself out through the buzzer door. MA asked patient a third time if he wanted water. Patient again declined and MA asked patient to have a seat in the lobby and take as much time as he needed prior to leaving. Patient stated he was fine and proceeded through the lobby. MA waited until the patient walked to the front entrance threshold before proceeding back to clinic. 5 seconds later MA was being called urgently by front office staff, where patient was witnessed falling to the floor. Front office stated patient dropped to his knee and caught himself prior to directly hitting the floor. Patient got himself up and proceeded to the outside bench.  MA Team Lead, RMA and RN supervisor triaged patient and attempted to obtain vital signs. Patient refused triage and did not allow RN supervisor to complete the vital sign portion. MA was able to obtain pulse and o2 stats. Patient recited his name, DOB, date and whereabouts all correctly. Patient declined further care and left the parking lot at his own will.

## 2018-01-19 ENCOUNTER — Ambulatory Visit (INDEPENDENT_AMBULATORY_CARE_PROVIDER_SITE_OTHER): Payer: Medicaid Other | Admitting: Infectious Diseases

## 2018-01-19 ENCOUNTER — Telehealth: Payer: Self-pay

## 2018-01-19 ENCOUNTER — Encounter: Payer: Self-pay | Admitting: Infectious Diseases

## 2018-01-19 VITALS — BP 125/80 | HR 71 | Temp 98.0°F | Wt 158.0 lb

## 2018-01-19 DIAGNOSIS — G40009 Localization-related (focal) (partial) idiopathic epilepsy and epileptic syndromes with seizures of localized onset, not intractable, without status epilepticus: Secondary | ICD-10-CM | POA: Diagnosis not present

## 2018-01-19 DIAGNOSIS — B20 Human immunodeficiency virus [HIV] disease: Secondary | ICD-10-CM | POA: Diagnosis not present

## 2018-01-19 DIAGNOSIS — Z113 Encounter for screening for infections with a predominantly sexual mode of transmission: Secondary | ICD-10-CM | POA: Diagnosis not present

## 2018-01-19 DIAGNOSIS — Z79899 Other long term (current) drug therapy: Secondary | ICD-10-CM | POA: Diagnosis not present

## 2018-01-19 DIAGNOSIS — R61 Generalized hyperhidrosis: Secondary | ICD-10-CM | POA: Diagnosis not present

## 2018-01-19 DIAGNOSIS — N529 Male erectile dysfunction, unspecified: Secondary | ICD-10-CM | POA: Insufficient documentation

## 2018-01-19 DIAGNOSIS — S43102A Unspecified dislocation of left acromioclavicular joint, initial encounter: Secondary | ICD-10-CM | POA: Diagnosis not present

## 2018-01-19 DIAGNOSIS — R569 Unspecified convulsions: Secondary | ICD-10-CM | POA: Diagnosis not present

## 2018-01-19 DIAGNOSIS — N182 Chronic kidney disease, stage 2 (mild): Secondary | ICD-10-CM | POA: Diagnosis not present

## 2018-01-19 HISTORY — DX: Male erectile dysfunction, unspecified: N52.9

## 2018-01-19 HISTORY — DX: Generalized hyperhidrosis: R61

## 2018-01-19 MED ORDER — SILDENAFIL CITRATE 25 MG PO TABS: 25.0000 mg | ORAL_TABLET | Freq: Every day | ORAL | 2 refills | Status: DC | PRN

## 2018-01-19 NOTE — Assessment & Plan Note (Signed)
He would like to be seen by Neuro  Will re-refer.

## 2018-01-19 NOTE — Telephone Encounter (Signed)
Called [facility] per MD to schedule a f/u appt for pt. Pt has complaints regarding shoulder pain and would like to f/u with ortho. Left a vm for office to call back to schedule this appt. PT would also like a call once appt is scheduled.  Perry Norton, [place]

## 2018-01-19 NOTE — Progress Notes (Signed)
   Subjective:    Patient ID: Perry Norton, male    DOB: 07-09-1975, 42 y.o.   MRN: [id number]  HPI 42 yo M with hx of HIV+, dx 2009. Prev on stribild, updated to genvoya end of 2018.  He also has hx of seizure d/o. He c/o diffuse pain.  Has been having drenching sweats. Was seen by ED then by IM but refused further w/u.  This continues to bother him. 6-8x/day then again at night. He sleeps under sheet, wife sleeps under 2 blankets.  Was started on depakote, vimpat and topamax by neuro (06-2017). He quit topamax to see if this would help his sweating without change.  Last seizures Tuesday, saturday, thursday  His labs note a slight, gradual increase in Cr.   Still awaiting call for shoulder surgery.  Has appt pending for GI.   He texted [place] doctor in [place] who asked he check his temperature when sweating- always < 100.   HIV 1 RNA Quant (copies/mL)  Date Value  01/06/2018 <20 NOT DETECTED  11/10/2017 53 (H)  07/05/2017 <20 NOT DETECTED   CD4 T Cell Abs (/uL)  Date Value  01/06/2018 750  11/10/2017 440  07/05/2017 540    Review of Systems  Constitutional: Negative for chills and fever.  Respiratory: Negative for shortness of breath.   Gastrointestinal: Positive for anal bleeding and blood in stool. Negative for constipation and diarrhea.  Genitourinary: Negative for difficulty urinating.  Neurological: Positive for seizures.  Please see HPI. All other systems reviewed and negative.      Objective:   Physical Exam  Constitutional: He is oriented to person, place, and time. He appears well-developed and well-nourished.  HENT:  Head: Normocephalic and atraumatic.  Mouth/Throat: No oropharyngeal exudate.  Eyes: Pupils are equal, round, and reactive to light. EOM are normal.  Neck: Normal range of motion. Neck supple.  Cardiovascular: Normal rate, regular rhythm and normal heart sounds.  Pulmonary/Chest: Effort normal and breath sounds normal.  Abdominal: Bowel sounds  are normal. He exhibits no distension. There is no tenderness.  Musculoskeletal: Normal range of motion. He exhibits no edema.  Lymphadenopathy:    He has no cervical adenopathy.  Neurological: He is alert and oriented to person, place, and time.  Psychiatric: He has a normal mood and affect.          Assessment & Plan:

## 2018-01-19 NOTE — Assessment & Plan Note (Addendum)
Near exiting room, pt describes ED. He states he usually has sex 3-4x on saturdays but is now down to 1-2.  His wife is (-). He is undetectable.  Will rx viagra for pt.

## 2018-01-19 NOTE — Assessment & Plan Note (Addendum)
He is doing well.  Defers pneumonia vaccine Offered/refused condoms. He is undetectable.  rtc in 6 months.

## 2018-01-19 NOTE — Assessment & Plan Note (Signed)
Will refer to ortho

## 2018-01-19 NOTE — Assessment & Plan Note (Signed)
This continues to vex us and him Will ask endo to see him

## 2018-01-19 NOTE — Assessment & Plan Note (Signed)
Will re-refer to neuro States he is drinking nightly.

## 2018-01-19 NOTE — Assessment & Plan Note (Signed)
Will continue to follow.  Most likely from [facility].

## 2018-01-24 ENCOUNTER — Telehealth: Payer: Self-pay

## 2018-01-24 NOTE — Telephone Encounter (Signed)
[facility] returned my call for pt to schedule an appt for f/u. PT is currently having shoulder pain is needs to f/u with Dr. Roda ShuttersXu. PT is scheduled for 02/04/18 at 9:45 and if needs to reschedule can call [phone]. PT is aware of this call and stated he would need to reschedule since he will be in [place] next week.  Perry CourierJose L Kailia Norton, [place]

## 2018-01-25 ENCOUNTER — Telehealth (INDEPENDENT_AMBULATORY_CARE_PROVIDER_SITE_OTHER): Payer: Self-pay | Admitting: Orthopaedic Surgery

## 2018-01-25 NOTE — Telephone Encounter (Signed)
Returned call to patient no answer and no answering machine pickup [phone]

## 2018-01-27 ENCOUNTER — Telehealth: Payer: Self-pay | Admitting: *Deleted

## 2018-01-27 NOTE — Telephone Encounter (Signed)
[facility] declined referral, as they do not accept medicaid. Andree CossHowell, Lionardo Haze M, RN

## 2018-01-27 NOTE — Telephone Encounter (Signed)
[facility] called, requesting office notes, labs, demographics for referral placed 7/31. RN faxed requested info to [phone], attention [facility]. Andree CossHowell, Yazeed Pryer M, RN

## 2018-02-02 ENCOUNTER — Ambulatory Visit: Payer: Medicaid Other | Admitting: Nurse Practitioner

## 2018-02-03 ENCOUNTER — Ambulatory Visit (INDEPENDENT_AMBULATORY_CARE_PROVIDER_SITE_OTHER): Payer: Medicaid Other | Admitting: Orthopaedic Surgery

## 2018-02-08 ENCOUNTER — Ambulatory Visit (INDEPENDENT_AMBULATORY_CARE_PROVIDER_SITE_OTHER): Payer: Medicaid Other | Admitting: Orthopaedic Surgery

## 2018-02-10 ENCOUNTER — Telehealth: Payer: Self-pay

## 2018-02-10 NOTE — Telephone Encounter (Signed)
Patient called today to f/u on referral for [facility]. Patient was told by another office that referral would be put in for him. Gave office information to patient to f/u on scheduling an appointment. Patient also requested advice regarding cancer treatments for a paper he is witting. Advised patient to check out medical journals and [employer] for good information Perry CourierJose L Norton, [place]

## 2018-02-11 ENCOUNTER — Telehealth (INDEPENDENT_AMBULATORY_CARE_PROVIDER_SITE_OTHER): Payer: Self-pay

## 2018-02-11 ENCOUNTER — Other Ambulatory Visit: Payer: Self-pay

## 2018-02-11 ENCOUNTER — Ambulatory Visit (INDEPENDENT_AMBULATORY_CARE_PROVIDER_SITE_OTHER): Payer: Medicaid Other | Admitting: Orthopaedic Surgery

## 2018-02-11 ENCOUNTER — Encounter (INDEPENDENT_AMBULATORY_CARE_PROVIDER_SITE_OTHER): Payer: Self-pay | Admitting: Orthopaedic Surgery

## 2018-02-11 DIAGNOSIS — M542 Cervicalgia: Secondary | ICD-10-CM | POA: Diagnosis not present

## 2018-02-11 DIAGNOSIS — G40009 Localization-related (focal) (partial) idiopathic epilepsy and epileptic syndromes with seizures of localized onset, not intractable, without status epilepticus: Secondary | ICD-10-CM

## 2018-02-11 NOTE — Progress Notes (Signed)
Office Visit Note   Patient: Perry Norton           Date of Birth: 1975-11-22           MRN: [id number] Visit Date: 02/11/2018              Requested by: Claiborne RiggFleming, Zelda W, NP [address] [place], [place] [address] PCP: Claiborne RiggFleming, Zelda W, NP   Assessment & Plan: Visit Diagnoses:  1. Cervicalgia     Plan: MRI of the cervical spine and left shoulder reordered today.  We stressed the importance of being compliant and scheduling the MRI so that we can better evaluate and treat him.  Follow-up with us after the MRIs have been obtained.  Follow-Up Instructions: Return if symptoms worsen or fail to improve.   Orders:  Orders Placed This Encounter  Procedures  . MR Cervical Spine w/o contrast  . MR Shoulder Left w/o contrast   No orders of the defined types were placed in this encounter.     Procedures: No procedures performed   Clinical Data: No additional findings.   Subjective: Chief Complaint  Patient presents with  . Neck - Pain    Kalief follows up today for his neck and shoulder pain.  He [facility].   Review of Systems  Constitutional: Negative.   All other systems reviewed and are negative.    Objective: Vital Signs: There were no vitals taken for this visit.  Physical Exam  Constitutional: He is oriented to person, place, and time. He appears well-developed and well-nourished.  Pulmonary/Chest: Effort normal.  Abdominal: Soft.  Neurological: He is alert and oriented to person, place, and time.  Skin: Skin is warm.  Psychiatric: He has a normal mood and affect. His behavior is normal. Judgment and thought content normal.  Nursing note and vitals reviewed.   Ortho Exam Exam is stable and nonfocal. Specialty Comments:  No specialty comments available.  Imaging: No results found.   PMFS History: Patient Active Problem List   Diagnosis Date Noted  . Diaphoresis 01/19/2018  . ED  (erectile dysfunction) 01/19/2018  . Left arm pain 10/18/2017  . Localization-related idiopathic epilepsy and epileptic syndromes with seizures of localized onset, not intractable, without status epilepticus (HCC) 07/22/2017  . Intractable migraine without aura and without status migrainosus 07/22/2017  . Chronic pain syndrome 07/22/2017  . Pleurisy 07/05/2017  . AC separation, type 2, left, initial encounter 05/25/2017  . CRI (chronic renal insufficiency) 05/20/2017  . Syphilis 05/20/2017  . Poor dentition 05/20/2017  . Pharyngeal irritation 12/05/2015  . Wheeze 12/05/2015  . Head trauma 06/10/2015  . 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 Seizure (HCC) 06/10/2015  . Brachial plexopathy 06/07/2014  . Nausea 03/19/2014  . Right arm weakness 03/02/2014  . Disseminated zoster 04/09/2011  . Human immunodeficiency virus (HIV) disease (HCC) 11/06/2010  . Numbness and tingling of right arm 11/06/2010   Past Medical History:  Diagnosis Date  . HIV (human immunodeficiency virus infection) (HCC)   . Seizures (HCC)    epilepsy    Family History  Problem Relation Age of Onset  . Sarcoidosis Mother     History reviewed. No pertinent surgical history. Social History   Occupational History  . Not on file  Tobacco Use  . Smoking status: Current Some Day Smoker    Packs/day: 1.00    Types: Cigarettes    Start date: 06/22/1994  .  Smokeless tobacco: Never Used  Substance and Sexual Activity  . Alcohol use: Yes    Alcohol/week: 3.0 standard drinks    Types: 3 Cans of beer per week    Comment: occ  . Drug use: Yes    Types: Marijuana    Comment: denies use 05/03/17  . Sexual activity: Not on file

## 2018-02-11 NOTE — Telephone Encounter (Signed)
Advised patient to give me new updated phone number. The one we have HOME NUMBER [phone]. He declined giving me phone number.

## 2018-02-16 ENCOUNTER — Telehealth (INDEPENDENT_AMBULATORY_CARE_PROVIDER_SITE_OTHER): Payer: Self-pay | Admitting: Orthopaedic Surgery

## 2018-02-16 NOTE — Telephone Encounter (Signed)
Patient called and wanted a returned call back. Asked patient what the call was in regards to and would not give reason...just wanted a call back.  [phone]

## 2018-02-17 ENCOUNTER — Encounter (INDEPENDENT_AMBULATORY_CARE_PROVIDER_SITE_OTHER): Payer: Self-pay | Admitting: Radiology

## 2018-02-17 NOTE — Telephone Encounter (Signed)
Patient still requesting to speak with you, still would not express what he needed a call about. Patients # [phone]

## 2018-02-17 NOTE — Telephone Encounter (Signed)
Called patient back  And would like new work note" lifting up to 40lbs & can work 4-5 hours a day."  Okay for note?

## 2018-02-17 NOTE — Telephone Encounter (Signed)
yes

## 2018-02-17 NOTE — Telephone Encounter (Signed)
Letter done, IC patient and advised, he will pickup letter tomorrow am

## 2018-02-20 ENCOUNTER — Ambulatory Visit
Admission: RE | Admit: 2018-02-20 | Discharge: 2018-02-20 | Disposition: A | Discharge status: Home / Self Care | Payer: Medicaid Other | Source: Ambulatory Visit | Attending: Orthopaedic Surgery | Admitting: Orthopaedic Surgery

## 2018-02-20 ENCOUNTER — Ambulatory Visit
Admission: RE | Admit: 2018-02-20 | Discharge: 2018-02-20 | Disposition: A | Discharge status: Home / Self Care | Payer: Medicaid Other | Source: Ambulatory Visit | Attending: Nurse Practitioner | Admitting: Nurse Practitioner

## 2018-02-20 DIAGNOSIS — M25512 Pain in left shoulder: Secondary | ICD-10-CM

## 2018-02-20 DIAGNOSIS — S43006A Unspecified dislocation of unspecified shoulder joint, initial encounter: Secondary | ICD-10-CM

## 2018-02-20 DIAGNOSIS — G8929 Other chronic pain: Secondary | ICD-10-CM

## 2018-02-20 DIAGNOSIS — R600 Localized edema: Secondary | ICD-10-CM | POA: Diagnosis not present

## 2018-02-20 DIAGNOSIS — M4802 Spinal stenosis, cervical region: Secondary | ICD-10-CM | POA: Diagnosis not present

## 2018-02-22 ENCOUNTER — Ambulatory Visit (INDEPENDENT_AMBULATORY_CARE_PROVIDER_SITE_OTHER): Payer: Medicaid Other | Admitting: Orthopaedic Surgery

## 2018-02-22 DIAGNOSIS — M5412 Radiculopathy, cervical region: Secondary | ICD-10-CM

## 2018-02-22 NOTE — Addendum Note (Signed)
Addended by: Mayra Reel on: 02/22/2018 03:32 PM   Modules accepted: Level of Service

## 2018-02-22 NOTE — Progress Notes (Signed)
Office Visit Note   Patient: Perry Norton           Date of Birth: Feb 03, 1976           MRN: [id number] Visit Date: 02/22/2018              Requested by: Claiborne Rigg, NP [address] [place], [place] [address] PCP: Claiborne Rigg, NP   Assessment & Plan: Visit Diagnoses:  1. Cervical radiculopathy     Plan: Impression is cervical radiculopathy and left distal clavicle osteolysis.  These findings are more consistent with cervical radiculopathy.  I would like to try an epidural with Dr. Alvester Morin to see if this provides him with any relief.  Follow-up as needed.  Questions encouraged and answered.  Follow-Up Instructions: Return if symptoms worsen or fail to improve.   Orders:  No orders of the defined types were placed in this encounter.  No orders of the defined types were placed in this encounter.     Procedures: No procedures performed   Clinical Data: No additional findings.   Subjective: Chief Complaint  Patient presents with  . Neck - Follow-up    Joyce follows up today for MRI review of his cervical spine and left shoulder.  Continues to have pain radiating down his entire left arm.   Review of Systems  Constitutional: Negative.   All other systems reviewed and are negative.    Objective: Vital Signs: There were no vitals taken for this visit.  Physical Exam  Constitutional: He is oriented to person, place, and time. He appears well-developed and well-nourished.  Pulmonary/Chest: Effort normal.  Abdominal: Soft.  Neurological: He is alert and oriented to person, place, and time.  Skin: Skin is warm.  Psychiatric: He has a normal mood and affect. His behavior is normal. Judgment and thought content normal.  Nursing note and vitals reviewed.   Ortho Exam AC joint is tender to palpation.  No overlying skin changes. Specialty Comments:  No specialty comments available.  Imaging: No results found.   PMFS History: Patient Active Problem  List   Diagnosis Date Noted  . Diaphoresis 01/19/2018  . ED (erectile dysfunction) 01/19/2018  . Left arm pain 10/18/2017  . Localization-related idiopathic epilepsy and epileptic syndromes with seizures of localized onset, not intractable, without status epilepticus (HCC) 07/22/2017  . Intractable migraine without aura and without status migrainosus 07/22/2017  . Chronic pain syndrome 07/22/2017  . Pleurisy 07/05/2017  . AC separation, type 2, left, initial encounter 05/25/2017  . CRI (chronic renal insufficiency) 05/20/2017  . Syphilis 05/20/2017  . Poor dentition 05/20/2017  . Pharyngeal irritation 12/05/2015  . Wheeze 12/05/2015  . Head trauma 06/10/2015  . 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 Seizure (HCC) 06/10/2015  . Brachial plexopathy 06/07/2014  . Nausea 03/19/2014  . Right arm weakness 03/02/2014  . Disseminated zoster 04/09/2011  . Human immunodeficiency virus (HIV) disease (HCC) 11/06/2010  . Numbness and tingling of right arm 11/06/2010   Past Medical History:  Diagnosis Date  . HIV (human immunodeficiency virus infection) (HCC)   . Seizures (HCC)    epilepsy    Family History  Problem Relation Age of Onset  . Sarcoidosis Mother     No past surgical history on file. Social History   Occupational History  . Not on file  Tobacco Use  . Smoking status: Current Some Day Smoker    Packs/day: 1.00    Types: Cigarettes    Start date: 06/22/1994  .  Smokeless tobacco: Never Used  Substance and Sexual Activity  . Alcohol use: Yes    Alcohol/week: 3.0 standard drinks    Types: 3 Cans of beer per week    Comment: occ  . Drug use: Yes    Types: Marijuana    Comment: denies use 05/03/17  . Sexual activity: Not on file

## 2018-02-23 ENCOUNTER — Other Ambulatory Visit: Payer: Self-pay | Admitting: Nurse Practitioner

## 2018-02-23 ENCOUNTER — Encounter: Payer: Self-pay | Admitting: Nurse Practitioner

## 2018-02-23 ENCOUNTER — Ambulatory Visit: Payer: Medicaid Other | Attending: Nurse Practitioner | Admitting: Nurse Practitioner

## 2018-02-23 VITALS — BP 112/75 | HR 85 | Temp 98.2°F | Ht 72.0 in | Wt 160.6 lb

## 2018-02-23 DIAGNOSIS — I10 Essential (primary) hypertension: Secondary | ICD-10-CM | POA: Insufficient documentation

## 2018-02-23 DIAGNOSIS — Z91018 Allergy to other foods: Secondary | ICD-10-CM | POA: Insufficient documentation

## 2018-02-23 DIAGNOSIS — M25512 Pain in left shoulder: Secondary | ICD-10-CM | POA: Insufficient documentation

## 2018-02-23 DIAGNOSIS — Z21 Asymptomatic human immunodeficiency virus [HIV] infection status: Secondary | ICD-10-CM | POA: Diagnosis not present

## 2018-02-23 DIAGNOSIS — G8929 Other chronic pain: Secondary | ICD-10-CM | POA: Insufficient documentation

## 2018-02-23 DIAGNOSIS — G40909 Epilepsy, unspecified, not intractable, without status epilepticus: Secondary | ICD-10-CM | POA: Diagnosis not present

## 2018-02-23 DIAGNOSIS — K625 Hemorrhage of anus and rectum: Secondary | ICD-10-CM | POA: Diagnosis not present

## 2018-02-23 DIAGNOSIS — Z79899 Other long term (current) drug therapy: Secondary | ICD-10-CM | POA: Insufficient documentation

## 2018-02-23 NOTE — Progress Notes (Signed)
Assessment & Plan:  Perry Norton was seen today for follow-up.  Diagnoses and all orders for this visit:  Chronic left shoulder pain Follow up with Ortho. He declines lyrica, gabapentin or cymbalta today. States "none of that shit works for me".  BRBPR (bright red blood per rectum) -     Ambulatory referral to Gastroenterology -     CMP14+EGFR -     CBC  Essential hypertension Stable. Well controlled. Continue all antihypertensives as prescribed.  Remember to bring in your blood pressure log with you for your follow up appointment.  DASH/Mediterranean Diets are healthier choices for HTN.    Patient has been counseled on age-appropriate routine health concerns for screening and prevention. These are reviewed and up-to-date. Referrals have been placed accordingly. Immunizations are up-to-date or declined.    Subjective:   Chief Complaint  Patient presents with  . Follow-up    Pt. is here for a follow-up on his shoulder pain.    HPI Perry Norton 42 y.o. male presents to office today for follow up to left shoulder pain (quadrilateral impingement syndrome). We discussed his recent MRI results and he reports the results had already been discussed with him by his orthopedic surgeon Dr. Erlinda Hong on yesterday. He does not have any questions or concerns and reports he is currently awaiting a call to schedule an injection procedure to help relieve the pain from his cervical radiculopathy.  He was referred to GI for rectal bleeding a few months ago. The note from GI states patient was scheduled for an appointment in august. However patient adamantly states he was never called for an appointment. I will place another referral today as he continues to endorse intermittent rectal bleeding.    CHRONIC HYPERTENSION Disease Monitoring Chronic. Stable. Well controlled.   Blood pressure range BP Readings from Last 3 Encounters:  02/23/18 112/75  01/19/18 125/80  01/06/18 100/80   Chest pain: no   Dyspnea:  no   Claudication: no  Medication compliance: difficult to assess. He states he takes all of his medications but won't name any specific medications.  Medication Side Effects  Lightheadedness: no   Urinary frequency: no   Edema: no   Impotence: no  Preventitive Healthcare:  Exercise: no   Diet Pattern: diet: general  Salt Restriction:  no   Review of Systems  Constitutional: Negative for fever, malaise/fatigue and weight loss.  HENT: Negative.  Negative for nosebleeds.   Eyes: Negative.  Negative for blurred vision, double vision and photophobia.  Respiratory: Negative.  Negative for cough and shortness of breath.   Cardiovascular: Negative.  Negative for chest pain, palpitations and leg swelling.  Gastrointestinal: Positive for blood in stool. Negative for abdominal pain, constipation, diarrhea, heartburn, melena, nausea and vomiting.  Musculoskeletal: Positive for back pain, joint pain, myalgias and neck pain.  Neurological: Negative.  Negative for dizziness, focal weakness, seizures (history of) and headaches.  Psychiatric/Behavioral: Negative.  Negative for suicidal ideas.    Past Medical History:  Diagnosis Date  . HIV (human immunodeficiency virus infection) ([place])   . Seizures ([place])    epilepsy    History reviewed. No pertinent surgical history.  Family History  Problem Relation Age of Onset  . Sarcoidosis Mother     Social History Reviewed with no changes to be made today.   Outpatient Medications Prior to Visit  Medication Sig Dispense Refill  . divalproex (DEPAKOTE) 500 MG DR tablet Take 2 tabs in AM, 1/2 tab at noon, 2 tabs in  PM 135 tablet 6  . elvitegravir-cobicistat-emtricitabine-tenofovir (GENVOYA) 150-150-200-10 MG TABS tablet Take 1 tablet by mouth daily with breakfast. 30 tablet 2  . lacosamide (VIMPAT) 200 MG TABS tablet Take 1 tablet (200 mg total) by mouth 2 (two) times daily. 60 tablet 5  . losartan (COZAAR) 50 MG tablet Take 1 tablet (50 mg total)  by mouth daily. 90 tablet 3  . topiramate (TOPAMAX) 50 MG tablet Take 1 tablet twice a day 120 tablet 6  . Vitamin D, Ergocalciferol, (DRISDOL) 50000 units CAPS capsule Take 1 capsule (50,000 Units total) by mouth every 7 (seven) days. 16 capsule 0  . sildenafil (VIAGRA) 25 MG tablet Take 1 tablet (25 mg total) by mouth daily as needed for erectile dysfunction. (Patient not taking: Reported on 02/23/2018) 10 tablet 2   No facility-administered medications prior to visit.     Allergies  Allergen Reactions  . Onion Rash       Objective:    BP 112/75 (BP Location: Left Arm, Patient Position: Sitting, Cuff Size: Normal)   Pulse 85   Temp 98.2 F (36.8 C) (Oral)   Ht 6' (1.829 m)   Wt 160 lb 9.6 oz (72.8 kg)   SpO2 96%   BMI 21.78 kg/m  Wt Readings from Last 3 Encounters:  02/23/18 160 lb 9.6 oz (72.8 kg)  01/19/18 158 lb (71.7 kg)  01/06/18 163 lb (73.9 kg)    Physical Exam  Constitutional: He is oriented to person, place, and time. He appears well-developed and well-nourished. He is cooperative.  HENT:  Head: Normocephalic and atraumatic.  Eyes: EOM are normal.  Neck: Decreased range of motion present.  Cardiovascular: Normal rate, regular rhythm and normal heart sounds. Exam reveals no gallop and no friction rub.  No murmur heard. Pulmonary/Chest: Effort normal and breath sounds normal. No tachypnea. No respiratory distress. He has no decreased breath sounds. He has no wheezes. He has no rhonchi. He has no rales. He exhibits no tenderness.  Abdominal: Bowel sounds are normal.  Musculoskeletal: He exhibits no edema.       Left shoulder: He exhibits decreased range of motion, tenderness and pain.  Neurological: He is alert and oriented to person, place, and time. Coordination normal.  Skin: Skin is warm and dry.  Psychiatric: He has a normal mood and affect. His behavior is normal. Judgment and thought content normal.  Nursing note and vitals reviewed.      Patient has  been counseled extensively about nutrition and exercise as well as the importance of adherence with medications and regular follow-up. The patient was given clear instructions to go to ER or return to medical center if symptoms don't improve, worsen or new problems develop. The patient verbalized understanding.   Follow-up: Return if symptoms worsen or fail to improve.   Gildardo Pounds, FNP-BC [facility] and [place], [place]   02/23/2018, 2:48 PM

## 2018-02-24 ENCOUNTER — Telehealth: Payer: Self-pay

## 2018-02-24 ENCOUNTER — Telehealth: Payer: Self-pay | Admitting: Nurse Practitioner

## 2018-02-24 ENCOUNTER — Encounter: Payer: Self-pay | Admitting: Nurse Practitioner

## 2018-02-24 LAB — CMP14+EGFR
ALT: 16 IU/L (ref 0–44)
AST: 21 IU/L (ref 0–40)
Albumin/Globulin Ratio: 1.9 (ref 1.2–2.2)
Albumin: 4.8 g/dL (ref 3.5–5.5)
Alkaline Phosphatase: 70 IU/L (ref 39–117)
BUN/Creatinine Ratio: 6 — ABNORMAL LOW (ref 9–20)
BUN: 12 mg/dL (ref 6–24)
Bilirubin Total: 0.3 mg/dL (ref 0.0–1.2)
CALCIUM: 9.9 mg/dL (ref 8.7–10.2)
CO2: 26 mmol/L (ref 20–29)
CREATININE: 1.96 mg/dL — AB (ref 0.76–1.27)
Chloride: 100 mmol/L (ref 96–106)
GFR calc Af Amer: 48 mL/min/{1.73_m2} — ABNORMAL LOW (ref >59)
GFR, EST NON AFRICAN AMERICAN: 42 mL/min/{1.73_m2} — AB (ref >59)
GLUCOSE: 96 mg/dL (ref 65–99)
Globulin, Total: 2.5 g/dL (ref 1.5–4.5)
Potassium: 4.2 mmol/L (ref 3.5–5.2)
SODIUM: 142 mmol/L (ref 134–144)
Total Protein: 7.3 g/dL (ref 6.0–8.5)

## 2018-02-24 LAB — CBC
HEMATOCRIT: 40.5 % (ref 37.5–51.0)
HEMOGLOBIN: 13.9 g/dL (ref 13.0–17.7)
MCH: 30.6 pg (ref 26.6–33.0)
MCHC: 34.3 g/dL (ref 31.5–35.7)
MCV: 89 fL (ref 79–97)
PLATELETS: 227 10*3/uL (ref 150–450)
RBC: 4.54 x10E6/uL (ref 4.14–5.80)
RDW: 13.9 % (ref 12.3–15.4)
WBC: 6.5 10*3/uL (ref 3.4–10.8)

## 2018-02-24 NOTE — Telephone Encounter (Signed)
Patient called to inform that he forgot to mention that he needed a letter for his Job/employer stating that he is not able to go to work due to his MRI results. Please follow up with patient.

## 2018-02-24 NOTE — Telephone Encounter (Signed)
Letter can be found in chart. Thank you

## 2018-02-24 NOTE — Telephone Encounter (Signed)
Will route to PCP 

## 2018-02-24 NOTE — Telephone Encounter (Signed)
Patient came in the office for his appt. And was inform on his shoulder Xray.

## 2018-02-24 NOTE — Telephone Encounter (Signed)
-----   Message from Claiborne Rigg, NP sent at 02/23/2018 12:35 AM EDT ----- Shoulder xray showing possible nerve impingement in the rotator cuff. Will need to see an orthopedic specialist.

## 2018-02-28 NOTE — Telephone Encounter (Signed)
Patient called back and was inform letter is ready for pick up.  Patient was also inform on lab results and PCP advising to make cut his BP medication in half and need to make a lab appt 6-8 weeks.

## 2018-02-28 NOTE — Telephone Encounter (Signed)
-----   Message from Claiborne Rigg, NP sent at 02/24/2018 11:31 PM EDT ----- Kidney function is slightly lower than last month. Make sure you are drinking plenty of water and not taking excessive amounts of ibuprofen, aleve, motrin or advil. I would also like for you to cut your blood pressure medication in half and we will recheck your labs in about 6-8 weeks. Please make a lab appointment for blood work

## 2018-02-28 NOTE — Telephone Encounter (Signed)
CMA attempt to call patient to inform him letter is ready for pick up. No answer and unable to leave a VM due to no voicemail box has been set up.

## 2018-03-01 ENCOUNTER — Other Ambulatory Visit: Payer: Self-pay | Admitting: Nurse Practitioner

## 2018-03-01 ENCOUNTER — Other Ambulatory Visit: Payer: Medicaid Other

## 2018-03-01 DIAGNOSIS — E782 Mixed hyperlipidemia: Secondary | ICD-10-CM

## 2018-03-09 DIAGNOSIS — K625 Hemorrhage of anus and rectum: Secondary | ICD-10-CM | POA: Diagnosis not present

## 2018-03-09 DIAGNOSIS — B20 Human immunodeficiency virus [HIV] disease: Secondary | ICD-10-CM | POA: Diagnosis not present

## 2018-03-21 ENCOUNTER — Ambulatory Visit: Payer: Medicaid Other | Admitting: Neurology

## 2018-03-21 ENCOUNTER — Telehealth: Payer: Self-pay | Admitting: Nurse Practitioner

## 2018-03-21 ENCOUNTER — Other Ambulatory Visit: Payer: Medicaid Other

## 2018-03-21 ENCOUNTER — Encounter

## 2018-03-21 NOTE — Telephone Encounter (Signed)
CMA spoke to patient and he stated his lawyer stated he need to get a test done call [facility] per his lawyer.

## 2018-03-21 NOTE — Telephone Encounter (Signed)
Patient would like to get a test done per his lawyer and was not sure if he has to schedule that or if a nurse and/or physician has to schedule it. Please follow up with patient. He would also like assistance with knowing where he can go for it as well.

## 2018-03-22 ENCOUNTER — Other Ambulatory Visit: Payer: Medicaid - Out of State

## 2018-03-23 ENCOUNTER — Other Ambulatory Visit: Payer: Self-pay | Admitting: Nurse Practitioner

## 2018-03-23 DIAGNOSIS — M542 Cervicalgia: Secondary | ICD-10-CM

## 2018-03-23 NOTE — Telephone Encounter (Signed)
Bone density test has been ordered.

## 2018-03-23 NOTE — Telephone Encounter (Signed)
CMA spoke to patient to inform him PCP placed the Bone density test in and patient was given the phone number of [facility] [phone] to schedule an appt to have it done.   Patient understood.

## 2018-03-25 ENCOUNTER — Ambulatory Visit: Payer: Medicaid Other | Attending: Nurse Practitioner

## 2018-03-25 ENCOUNTER — Other Ambulatory Visit: Payer: Self-pay | Admitting: Infectious Diseases

## 2018-03-25 DIAGNOSIS — E782 Mixed hyperlipidemia: Secondary | ICD-10-CM | POA: Diagnosis not present

## 2018-03-25 DIAGNOSIS — B20 Human immunodeficiency virus [HIV] disease: Secondary | ICD-10-CM

## 2018-03-25 NOTE — Progress Notes (Signed)
Patient here for lab visit only 

## 2018-03-26 LAB — LIPID PANEL
CHOLESTEROL TOTAL: 180 mg/dL (ref 100–199)
Chol/HDL Ratio: 3.3 ratio (ref 0.0–5.0)
HDL: 54 mg/dL (ref >39)
LDL Calculated: 98 mg/dL (ref 0–99)
Triglycerides: 142 mg/dL (ref 0–149)
VLDL CHOLESTEROL CAL: 28 mg/dL (ref 5–40)

## 2018-03-28 ENCOUNTER — Telehealth: Payer: Self-pay

## 2018-03-28 NOTE — Telephone Encounter (Signed)
-----   Message from Claiborne Rigg, NP sent at 03/27/2018 11:25 PM EDT ----- Cholesterol levels are all normal

## 2018-03-28 NOTE — Telephone Encounter (Signed)
CMA spoke to patient to inform on results.  Patient understood.  Patient ask if patient need to draw lab again for his kidney function due to being a little elevated previously.

## 2018-03-29 NOTE — Telephone Encounter (Signed)
Yes in 6-8 weeks.

## 2018-03-30 NOTE — Telephone Encounter (Signed)
CMA spoke to patient.Patient have an upcoming appt. With PCP on 10.21.19, will make his lab appt for his kidney function at his [facility].

## 2018-03-31 ENCOUNTER — Ambulatory Visit: Payer: Medicaid Other | Admitting: Neurology

## 2018-04-11 ENCOUNTER — Encounter: Payer: Self-pay | Admitting: Nurse Practitioner

## 2018-04-11 ENCOUNTER — Ambulatory Visit: Payer: Medicaid Other | Attending: Nurse Practitioner | Admitting: Nurse Practitioner

## 2018-04-11 VITALS — BP 172/68 | HR 107 | Temp 98.2°F | Ht 72.0 in | Wt 160.0 lb

## 2018-04-11 DIAGNOSIS — Z79899 Other long term (current) drug therapy: Secondary | ICD-10-CM | POA: Insufficient documentation

## 2018-04-11 DIAGNOSIS — R0609 Other forms of dyspnea: Secondary | ICD-10-CM | POA: Diagnosis not present

## 2018-04-11 DIAGNOSIS — G40909 Epilepsy, unspecified, not intractable, without status epilepticus: Secondary | ICD-10-CM | POA: Diagnosis not present

## 2018-04-11 DIAGNOSIS — R61 Generalized hyperhidrosis: Secondary | ICD-10-CM | POA: Diagnosis not present

## 2018-04-11 DIAGNOSIS — Z92241 Personal history of systemic steroid therapy: Secondary | ICD-10-CM | POA: Diagnosis not present

## 2018-04-11 DIAGNOSIS — B20 Human immunodeficiency virus [HIV] disease: Secondary | ICD-10-CM | POA: Insufficient documentation

## 2018-04-11 DIAGNOSIS — R7989 Other specified abnormal findings of blood chemistry: Secondary | ICD-10-CM | POA: Diagnosis not present

## 2018-04-11 DIAGNOSIS — L749 Eccrine sweat disorder, unspecified: Secondary | ICD-10-CM | POA: Diagnosis not present

## 2018-04-11 NOTE — Progress Notes (Signed)
Assessment & Plan:  Perry Norton was seen today for follow-up.  Diagnoses and all orders for this visit:  Dyspnea on minimal exertion -     DG Chest 4 View; Future  History of systemic steroid therapy -     DG Bone Density; Future  Elevated serum creatinine -     CMP14+EGFR  Sweating abnormality -     Testosterone; Future    Patient has been counseled on age-appropriate routine health concerns for screening and prevention. These are reviewed and up-to-date. Referrals have been placed accordingly. Immunizations are up-to-date or declined.    Subjective:   Chief Complaint  Patient presents with  . Follow-up   HPI Perry Norton 42 y.o. male presents to office today with complaints of dyspnea.   Dyspnea: Patient complains of shortness of breath at rest.  Symptoms include difficulty breathing, dyspnea on exertion, dyspnea when laying down, no cough, shortness of breath and tightness in chest. Symptoms began a few weeks ago, unchanged since that time.  Patient denies constant cough, edema ,, frequent throat clearing, hemoptysis ,, hoarseness ,, sputum production and wheezing. Associated symptoms include NONE. Patient has not had recent travel.  Weight has been stable.  Appetite has been unaffected. Symptoms are exacerbated by minimal activity. Symptoms are alleviated by nothing. He has not started any new medications within the past few weeks.   Review of Systems  Constitutional: Positive for diaphoresis (chronic). Negative for fever, malaise/fatigue and weight loss.  HENT: Negative.  Negative for nosebleeds.   Eyes: Negative.  Negative for blurred vision, double vision and photophobia.  Respiratory: Positive for shortness of breath. Negative for cough, hemoptysis, sputum production and wheezing.   Cardiovascular: Negative.  Negative for chest pain, palpitations and leg swelling.  Gastrointestinal: Negative.  Negative for heartburn, nausea and vomiting.  Musculoskeletal: Negative.  Negative  for myalgias.  Neurological: Positive for seizures. Negative for dizziness, focal weakness and headaches.  Psychiatric/Behavioral: Negative.  Negative for suicidal ideas.    Past Medical History:  Diagnosis Date  . HIV (human immunodeficiency virus infection) ([place])   . 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 No pertinent surgical history.  Family History  Problem Relation Age of Onset  . Sarcoidosis Mother     Social History Reviewed with no changes to be made today.   Outpatient Medications Prior to Visit  Medication Sig Dispense Refill  . divalproex (DEPAKOTE) 500 MG DR tablet Take 2 tabs in AM, 1/2 tab at noon, 2 tabs in PM 135 tablet 6  . GENVOYA 150-150-200-10 MG TABS tablet TAKE 1 TABLET BY MOUTH DAILY WITH BREAKFAST 30 tablet 6  . lacosamide (VIMPAT) 200 MG TABS tablet Take 1 tablet (200 mg total) by mouth 2 (two) times daily. 60 tablet 5  . losartan (COZAAR) 50 MG tablet Take 1 tablet (50 mg total) by mouth daily. 90 tablet 3  . sildenafil (VIAGRA) 25 MG tablet Take 1 tablet (25 mg total) by mouth daily as needed for erectile dysfunction. 10 tablet 2  . topiramate (TOPAMAX) 50 MG tablet Take 1 tablet twice a day 120 tablet 6  . Vitamin D, Ergocalciferol, (DRISDOL) 50000 units CAPS capsule Take 1 capsule (50,000 Units total) by mouth every 7 (seven) days. 16 capsule 0   No facility-administered medications prior to visit.     Allergies  Allergen Reactions  . Onion Rash       Objective:    BP (!) 172/68 (BP Location: Left Arm, Patient Position:  Sitting, Cuff Size: Normal)   Pulse (!) 107   Temp 98.2 F (36.8 C) (Oral)   Ht 6' (1.829 m)   Wt 160 lb (72.6 kg)   SpO2 99%   BMI 21.70 kg/m  Wt Readings from Last 3 Encounters:  04/11/18 160 lb (72.6 kg)  02/23/18 160 lb 9.6 oz (72.8 kg)  01/19/18 158 lb (71.7 kg)    Physical Exam  Constitutional: He is oriented to person, place, and time. He appears well-developed and well-nourished. He is  cooperative.  HENT:  Head: Normocephalic and atraumatic.  Eyes: EOM are normal.  Neck: Normal range of motion.  Cardiovascular: Regular rhythm, normal heart sounds and intact distal pulses. Tachycardia present. Exam reveals no gallop and no friction rub.  No murmur heard. Pulmonary/Chest: Effort normal and breath sounds normal. No tachypnea. No respiratory distress. He has no decreased breath sounds. He has no wheezes. He has no rhonchi. He has no rales. He exhibits tenderness. He exhibits no mass, no edema and no swelling.    Abdominal: Bowel sounds are normal.  Musculoskeletal: Normal range of motion. He exhibits no edema.  Neurological: He is alert and oriented to person, place, and time. Coordination normal.  Skin: Skin is warm and dry.  Psychiatric: He has a normal mood and affect. His behavior is normal. Judgment and thought content normal.  Nursing note and vitals reviewed.      Patient has been counseled extensively about nutrition and exercise as well as the importance of adherence with medications and regular follow-up. The patient was given clear instructions to go to ER or return to medical center if symptoms don't improve, worsen or new problems develop. The patient verbalized understanding.   Follow-up: Return if symptoms worsen or fail to improve.   Gildardo Pounds, FNP-BC [facility] [place], [place]   04/11/2018, 5:23 PM

## 2018-04-11 NOTE — Patient Instructions (Signed)
Testosterone Why am I having this test? Testosterone is a hormone made by the male's testicles and by the adrenal glands, which are a pair of glands on top of the kidneys. Starting at puberty, testosterone stimulates the development of secondary sex characteristics. This includes a deeper voice, growth of muscles and body hair, and penis enlargement. Females also produce testosterone in both the adrenal glands and ovaries. A male's body converts testosterone into estradiol, the main male sex hormone. An abnormal level of testosterone can cause health issues in both males and females. You may have this test if your health care provider suspects that an abnormal testosterone level is causing or contributing to other health problems. In males, symptoms of an abnormal testosterone level include:  Infertility.  Erectile dysfunction.  Delayed puberty or premature puberty.  In females, symptoms of an abnormally high testosterone level include:  Infertility.  Polycystic ovarian syndrome (PCOS).  Developing masculine features (virilization).  What kind of sample is taken? This test requires a blood sample taken from a vein in your arm or hand. The sample for this test is usually collected in the morning. The amount of testosterone in your blood is highest at that time. What do the results mean? It is your responsibility to obtain your test results. Ask the lab or department performing the test when and how you will get your results. Contact your health care provider to discuss any questions you have about your results. The result of a blood test for testosterone will be given as a range of values. A testosterone level that is outside the normal range may indicate a health problem. Testosterone is measured in nanograms per deciliter (ng/dL). Range of normal values Ranges for normal values may vary among different labs and hospitals. You should always check with your health care provider after  having lab work or other tests done to discuss whether your values are considered within normal limits. Normal levels of total testosterone are as follows:  Male: ? 7 months to 40 years old: less than 30 ng/dL. ? 10-13 years old: less than 300 ng/dL. ? 14-15 years old: 170-540 ng/dL. ? 16-19 years old: 250-910 ng/dL. ? 40 years old and over: 280-1,080 ng/dL.  Male: ? 7 months to 40 years old: less than 30 ng/dL. ? 10-13 years old: less than 40 ng/dL. ? 14-15 years old: less than 60 ng/dL. ? 16-19 years old: less than 70 ng/dL. ? 40 years old and over: less than 70 ng/dL.  Meaning of results outside normal value ranges A testosterone level that is too low or too high can indicate a number of health problems. In males:  A high testosterone level can occur if you: ? Have certain types of tumors. ? Have an overactive thyroid gland (hyperthyroidism). ? Use anabolic steroids. ? Are starting puberty early (precocious puberty). ? Have an inherited disorder that affects the adrenal glands (congenital adrenal hyperplasia).  A low testosterone level can occur if you: ? Have certain genetic diseases. ? Have had certain viral infections, such as mumps. ? Have pituitary disease. ? Have had an injury to the testicles. ? Are an alcoholic.  In females:  A high testosterone level can occur if you have: ? Certain types of tumors. ? An inherited disorder that affects certain cells in the adrenal glands (congenital adrenocortical hyperplasia). ? PCOS.  A low testosterone level does not cause health problems.  Discuss the results of your testosterone test with your health care provider. Your health care   provider will use the results of this test and other tests to make a diagnosis. Talk with your health care provider to discuss your results, treatment options, and if necessary, the need for more tests. Talk with your health care provider if you have any questions about your results. This  information is not intended to replace advice given to you by your health care provider. Make sure you discuss any questions you have with your health care provider. Document Released: 06/25/2004 Document Revised: 02/08/2016 Document Reviewed: 10/04/2013 Elsevier Interactive Patient Education  2018 Elsevier Inc.  

## 2018-04-12 ENCOUNTER — Ambulatory Visit (HOSPITAL_COMMUNITY)
Admission: RE | Admit: 2018-04-12 | Discharge: 2018-04-12 | Disposition: A | Discharge status: Home / Self Care | Payer: Medicaid Other | Source: Ambulatory Visit | Attending: Nurse Practitioner | Admitting: Nurse Practitioner

## 2018-04-12 ENCOUNTER — Ambulatory Visit: Payer: Medicaid Other

## 2018-04-12 ENCOUNTER — Other Ambulatory Visit: Payer: Self-pay | Admitting: Nurse Practitioner

## 2018-04-12 ENCOUNTER — Telehealth: Payer: Self-pay | Admitting: Nurse Practitioner

## 2018-04-12 DIAGNOSIS — L749 Eccrine sweat disorder, unspecified: Secondary | ICD-10-CM | POA: Insufficient documentation

## 2018-04-12 DIAGNOSIS — R0609 Other forms of dyspnea: Secondary | ICD-10-CM | POA: Insufficient documentation

## 2018-04-12 LAB — CMP14+EGFR
ALT: 15 IU/L (ref 0–44)
AST: 19 IU/L (ref 0–40)
Albumin/Globulin Ratio: 2 (ref 1.2–2.2)
Albumin: 5.3 g/dL (ref 3.5–5.5)
Alkaline Phosphatase: 75 IU/L (ref 39–117)
BUN/Creatinine Ratio: 10 (ref 9–20)
BUN: 16 mg/dL (ref 6–24)
Bilirubin Total: 0.5 mg/dL (ref 0.0–1.2)
CO2: 25 mmol/L (ref 20–29)
Calcium: 10.2 mg/dL (ref 8.7–10.2)
Chloride: 100 mmol/L (ref 96–106)
Creatinine, Ser: 1.57 mg/dL — ABNORMAL HIGH (ref 0.76–1.27)
GFR calc Af Amer: 63 mL/min/{1.73_m2} (ref >59)
GFR calc non Af Amer: 54 mL/min/{1.73_m2} — ABNORMAL LOW (ref >59)
Globulin, Total: 2.7 g/dL (ref 1.5–4.5)
Glucose: 104 mg/dL — ABNORMAL HIGH (ref 65–99)
Potassium: 4.5 mmol/L (ref 3.5–5.2)
Sodium: 141 mmol/L (ref 134–144)
Total Protein: 8 g/dL (ref 6.0–8.5)

## 2018-04-12 NOTE — Telephone Encounter (Signed)
Perry Norton with radiology called to confirm x-ray orders that were put in. Please follow up.

## 2018-04-12 NOTE — Telephone Encounter (Signed)
Provider was inform on the changes for patient Xray orders.

## 2018-04-13 LAB — TESTOSTERONE: Testosterone: 418 ng/dL (ref 264–916)

## 2018-04-14 ENCOUNTER — Telehealth: Payer: Self-pay

## 2018-04-14 NOTE — Telephone Encounter (Signed)
-----   Message from Claiborne Rigg, NP sent at 04/14/2018  5:37 AM EDT ----- Chest xray is normal

## 2018-04-14 NOTE — Telephone Encounter (Signed)
-----   Message from Claiborne Rigg, NP sent at 04/14/2018  5:37 AM EDT ----- Testosterone levels are normal. You likely have hyperhidrosis which is a medical term for excessive sweating.

## 2018-04-14 NOTE — Telephone Encounter (Signed)
CMA spoke to patient to inform on results and xray results.  Pt. Verified DOB. Pt. Understood.  Pt. Is stated he is waiting for his kidney labs results.

## 2018-04-18 ENCOUNTER — Telehealth: Payer: Self-pay

## 2018-04-18 ENCOUNTER — Ambulatory Visit (INDEPENDENT_AMBULATORY_CARE_PROVIDER_SITE_OTHER): Payer: Medicaid Other | Admitting: Neurology

## 2018-04-18 DIAGNOSIS — G40009 Localization-related (focal) (partial) idiopathic epilepsy and epileptic syndromes with seizures of localized onset, not intractable, without status epilepticus: Secondary | ICD-10-CM

## 2018-04-18 NOTE — Telephone Encounter (Signed)
CMA spoke to patient to inform on results.  Pt. Verified DOB. Pt. Understood.  

## 2018-04-18 NOTE — Telephone Encounter (Signed)
-----   Message from Claiborne Rigg, NP sent at 04/17/2018  9:24 PM EDT ----- Kidney function has improved. Will continue to monitor and recheck in 3 months.

## 2018-04-25 ENCOUNTER — Telehealth: Payer: Self-pay | Admitting: Neurology

## 2018-04-25 NOTE — Telephone Encounter (Signed)
Patient was calling to cancel appt Wednesday because he has a procedure. He needed to know if you could work him in before June. OR give him the EEG results by phone. Please call him back at [phone]. Thanks!

## 2018-04-27 ENCOUNTER — Ambulatory Visit: Payer: Medicaid - Out of State | Admitting: Neurology

## 2018-04-27 DIAGNOSIS — K648 Other hemorrhoids: Secondary | ICD-10-CM | POA: Diagnosis not present

## 2018-04-27 DIAGNOSIS — K625 Hemorrhage of anus and rectum: Secondary | ICD-10-CM | POA: Diagnosis not present

## 2018-04-27 DIAGNOSIS — K644 Residual hemorrhoidal skin tags: Secondary | ICD-10-CM | POA: Diagnosis not present

## 2018-04-27 NOTE — Procedures (Signed)
ELECTROENCEPHALOGRAM REPORT  Dates of Recording: 04/18/2018 9:37AM to 04/20/2018 9:54AM  Patient's Name: Perry Norton MRN: [id number] Date of Birth: Nov 10, 1975  Referring Provider: Dr. 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 EEG for classification.  Medications:  VIMPAT 200 MG TABS tablet  TOPAMAX 50 MG tablet  DEPAKOTE 500 MG DR tablet  STRIBILD 150-150-200-300 MG TABS tablet  GENVOYA 150-150-200-10 MG TABS tablet  NORCO/VICODIN 5-325 MG tablet  COZAAR 50 MG tablet  ROBAXIN 500 MG tablet   Technical Summary: This is a 48-hour multichannel digital EEG recording measured by the international 10-20 system with electrodes applied with paste and impedances below 5000 ohms performed as portable with EKG monitoring.  The digital EEG was referentially recorded, reformatted, and digitally filtered in a variety of bipolar and referential montages for optimal display.    DESCRIPTION OF RECORDING: During maximal wakefulness, the background activity consisted of a symmetric 10 Hz posterior dominant rhythm which was reactive to eye opening.  There were no epileptiform discharges or focal slowing seen in wakefulness.  During the recording, the patient progresses through wakefulness, drowsiness, and Stage 2 sleep.  Again, there were no epileptiform discharges seen.  Events: There were no push button events.   There were no electrographic seizures seen. There are several periods of theta activity seen over the F7 electrode and P3 electrode that appear to be electrode artifact however no video to correlate (26:31, 30:51, 34:22 hours). EKG lead was unremarkable.  IMPRESSION: This 48-hour ambulatory EEG study is normal.    CLINICAL CORRELATION: A normal EEG does not exclude a clinical diagnosis of epilepsy. Typical events were not reported. If further clinical  questions remain, inpatient video EEG monitoring may be helpful.   Patrcia Dolly, M.D.

## 2018-04-28 ENCOUNTER — Telehealth: Payer: Self-pay

## 2018-04-28 NOTE — Telephone Encounter (Signed)
Spoke with pt relaying message below.   

## 2018-04-28 NOTE — Telephone Encounter (Signed)
-----   Message from Van Clines, MD sent at 04/27/2018  4:36 PM EST ----- Pls let him know that the brain wave test was normal. Would continue current seizure medications at this time. Next step if he continues to have seizures on seizure medications would be an inpatient stay at [facility] so we can see where the seizure are coming from. Thanks

## 2018-05-10 ENCOUNTER — Ambulatory Visit: Payer: Medicaid Other | Admitting: Nurse Practitioner

## 2018-05-10 ENCOUNTER — Other Ambulatory Visit: Payer: Medicaid Other

## 2018-05-10 DIAGNOSIS — Z113 Encounter for screening for infections with a predominantly sexual mode of transmission: Secondary | ICD-10-CM | POA: Diagnosis not present

## 2018-05-10 DIAGNOSIS — B20 Human immunodeficiency virus [HIV] disease: Secondary | ICD-10-CM | POA: Diagnosis not present

## 2018-05-10 DIAGNOSIS — Z79899 Other long term (current) drug therapy: Secondary | ICD-10-CM

## 2018-05-11 LAB — T-HELPER CELL (CD4) - (RCID CLINIC ONLY)
CD4 % Helper T Cell: 30 % — ABNORMAL LOW (ref 33–55)
CD4 T Cell Abs: 500 /uL (ref 400–2700)

## 2018-05-12 LAB — CBC
HCT: 41.6 % (ref 38.5–50.0)
Hemoglobin: 13.9 g/dL (ref 13.2–17.1)
MCH: 30 pg (ref 27.0–33.0)
MCHC: 33.4 g/dL (ref 32.0–36.0)
MCV: 89.8 fL (ref 80.0–100.0)
MPV: 10.9 fL (ref 7.5–12.5)
PLATELETS: 207 10*3/uL (ref 140–400)
RBC: 4.63 10*6/uL (ref 4.20–5.80)
RDW: 13.6 % (ref 11.0–15.0)
WBC: 4.9 10*3/uL (ref 3.8–10.8)

## 2018-05-12 LAB — LIPID PANEL
Cholesterol: 186 mg/dL (ref <200)
HDL: 56 mg/dL (ref >40)
LDL Cholesterol (Calc): 110 mg/dL (calc) — ABNORMAL HIGH
NON-HDL CHOLESTEROL (CALC): 130 mg/dL — AB (ref <130)
Total CHOL/HDL Ratio: 3.3 (calc) (ref <5.0)
Triglycerides: 95 mg/dL (ref <150)

## 2018-05-12 LAB — FLUORESCENT TREPONEMAL AB(FTA)-IGG-BLD: Fluorescent Treponemal ABS: REACTIVE — AB

## 2018-05-12 LAB — COMPREHENSIVE METABOLIC PANEL
AG Ratio: 1.9 (calc) (ref 1.0–2.5)
ALBUMIN MSPROF: 4.6 g/dL (ref 3.6–5.1)
ALT: 13 U/L (ref 9–46)
AST: 17 U/L (ref 10–40)
Alkaline phosphatase (APISO): 67 U/L (ref 40–115)
BUN / CREAT RATIO: 10 (calc) (ref 6–22)
BUN: 14 mg/dL (ref 7–25)
CHLORIDE: 102 mmol/L (ref 98–110)
CO2: 30 mmol/L (ref 20–32)
CREATININE: 1.47 mg/dL — AB (ref 0.60–1.35)
Calcium: 9.4 mg/dL (ref 8.6–10.3)
GLOBULIN: 2.4 g/dL (ref 1.9–3.7)
GLUCOSE: 98 mg/dL (ref 65–99)
POTASSIUM: 4 mmol/L (ref 3.5–5.3)
Sodium: 140 mmol/L (ref 135–146)
Total Bilirubin: 0.4 mg/dL (ref 0.2–1.2)
Total Protein: 7 g/dL (ref 6.1–8.1)

## 2018-05-12 LAB — HIV-1 RNA QUANT-NO REFLEX-BLD
HIV 1 RNA QUANT: 30 {copies}/mL — AB
HIV-1 RNA Quant, Log: 1.48 Log copies/mL — ABNORMAL HIGH

## 2018-05-12 LAB — RPR TITER: RPR Titer: 1:32 {titer} — ABNORMAL HIGH

## 2018-05-12 LAB — RPR: RPR Ser Ql: REACTIVE — AB

## 2018-05-16 ENCOUNTER — Ambulatory Visit
Admission: RE | Admit: 2018-05-16 | Discharge: 2018-05-16 | Disposition: A | Discharge status: Home / Self Care | Payer: Medicaid Other | Source: Ambulatory Visit | Attending: Nurse Practitioner | Admitting: Nurse Practitioner

## 2018-05-16 DIAGNOSIS — Z92241 Personal history of systemic steroid therapy: Secondary | ICD-10-CM

## 2018-05-16 DIAGNOSIS — Z13820 Encounter for screening for osteoporosis: Secondary | ICD-10-CM | POA: Diagnosis not present

## 2018-05-24 ENCOUNTER — Encounter: Payer: Medicaid Other | Admitting: Family

## 2018-05-25 ENCOUNTER — Ambulatory Visit: Payer: Medicaid Other | Attending: Nurse Practitioner | Admitting: Nurse Practitioner

## 2018-05-25 ENCOUNTER — Encounter: Payer: Self-pay | Admitting: Nurse Practitioner

## 2018-05-25 VITALS — BP 107/64 | HR 87 | Temp 97.6°F | Ht 72.0 in | Wt 176.0 lb

## 2018-05-25 DIAGNOSIS — R0789 Other chest pain: Secondary | ICD-10-CM | POA: Insufficient documentation

## 2018-05-25 DIAGNOSIS — R079 Chest pain, unspecified: Secondary | ICD-10-CM | POA: Diagnosis not present

## 2018-05-25 DIAGNOSIS — G40909 Epilepsy, unspecified, not intractable, without status epilepticus: Secondary | ICD-10-CM | POA: Diagnosis not present

## 2018-05-25 DIAGNOSIS — Z79899 Other long term (current) drug therapy: Secondary | ICD-10-CM | POA: Insufficient documentation

## 2018-05-25 DIAGNOSIS — R21 Rash and other nonspecific skin eruption: Secondary | ICD-10-CM | POA: Insufficient documentation

## 2018-05-25 DIAGNOSIS — I10 Essential (primary) hypertension: Secondary | ICD-10-CM | POA: Insufficient documentation

## 2018-05-25 DIAGNOSIS — B20 Human immunodeficiency virus [HIV] disease: Secondary | ICD-10-CM | POA: Insufficient documentation

## 2018-05-25 DIAGNOSIS — E559 Vitamin D deficiency, unspecified: Secondary | ICD-10-CM | POA: Insufficient documentation

## 2018-05-25 DIAGNOSIS — F1290 Cannabis use, unspecified, uncomplicated: Secondary | ICD-10-CM | POA: Diagnosis not present

## 2018-05-25 NOTE — Progress Notes (Signed)
Assessment & Plan:  Perry Norton was seen today for rash.  Diagnoses and all orders for this visit:  Skin rash Currently RESOLVED  Left-sided chest pain -     DG Chest 4 View; Future  Essential Hypertension BP recheck in 2-3 weeks Remember to bring in your blood pressure log with you for your follow up appointment.  DASH/Mediterranean Diets are healthier choices for HTN.   Vitamin D deficiency disease -     VITAMIN D 25 Hydroxy (Vit-D Deficiency, Fractures)    Patient has been counseled on age-appropriate routine health concerns for screening and prevention. These are reviewed and up-to-date. Referrals have been placed accordingly. Immunizations are up-to-date or declined.    Subjective:   Chief Complaint  Patient presents with  . Rash    Pt. stated he had a rash on his chest and his face for two weeks. Pt. stated his rash is much better. Pt. stated after he shower sometimes he would have white bumps appear on his arm.    HPI Perry Norton 42 y.o. male presents to office today.  He states he initially made the appointment due to a rash on his left chest however today the rash has completely resolved.  He he does endorse noticing "white bumps" on his bilateral arms after he takes a very hot shower or bath. They bumps go away on their own after he gets out out of the shower and dries off.  Today there are no lesions or rash present.  I have instructed him to take pictures of his rash at the next onset so that I can evaluate at his next office visit.      ESSENTIAL HYPERTENSION Blood pressure is normal today however he reports he has not taken his losartan in 5 days when he ran out on Friday.  He does endorse being "high" today and has been smoking marijuana and drinking alcohol.  I have instructed him to return sober and drug-free for a blood pressure recheck in order to evaluate if he truly needs to take losartan or if it can be discontinued.  He currently denies any lightheadedness,  palpitations, headaches, visual disturbances, or bilateral lower extremity edema.   LEFT SIDED CHEST PAIN He endorses chronic left-sided chest pain.  Other symptoms include difficulty breathing, dyspnea on exertion, dyspnea when lying down, and tightness in the chest.  He had a two-view chest x-ray performed on 04/11/2018 which was normal.  At this time we will order a 4 view chest x-ray for further evaluation.  He denies any hemoptysis, productive cough cough with sputum production.   Review of Systems  Constitutional: Negative for fever, malaise/fatigue and weight loss.  HENT: Negative.  Negative for nosebleeds.   Eyes: Negative.  Negative for blurred vision, double vision and photophobia.  Respiratory: Positive for shortness of breath. Negative for cough, hemoptysis, sputum production and wheezing.   Cardiovascular: Positive for chest pain. Negative for palpitations and leg swelling.  Gastrointestinal: Negative.  Negative for heartburn, nausea and vomiting.  Musculoskeletal: Negative.  Negative for myalgias.  Neurological: Negative.  Negative for dizziness, focal weakness, seizures and headaches.  Psychiatric/Behavioral: Negative.  Negative for suicidal ideas.    Past Medical History:  Diagnosis Date  . HIV (human immunodeficiency virus infection) ([facility])   . Seizures ([facility])    epilepsy    History reviewed. No pertinent surgical history.  Family History  Problem Relation Age of Onset  . Sarcoidosis Mother     Social History Reviewed with no  changes to be made today.   Outpatient Medications Prior to Visit  Medication Sig Dispense Refill  . divalproex (DEPAKOTE) 500 MG DR tablet Take 2 tabs in AM, 1/2 tab at noon, 2 tabs in PM 135 tablet 6  . GENVOYA 150-150-200-10 MG TABS tablet TAKE 1 TABLET BY MOUTH DAILY WITH BREAKFAST 30 tablet 6  . lacosamide (VIMPAT) 200 MG TABS tablet Take 1 tablet (200 mg total) by mouth 2 (two) times daily. 60 tablet 5  . losartan (COZAAR) 50 MG  tablet Take 1 tablet (50 mg total) by mouth daily. 90 tablet 3  . sildenafil (VIAGRA) 25 MG tablet Take 1 tablet (25 mg total) by mouth daily as needed for erectile dysfunction. 10 tablet 2  . topiramate (TOPAMAX) 50 MG tablet Take 1 tablet twice a day 120 tablet 6  . Vitamin D, Ergocalciferol, (DRISDOL) 50000 units CAPS capsule Take 1 capsule (50,000 Units total) by mouth every 7 (seven) days. 16 capsule 0   No facility-administered medications prior to visit.     Allergies  Allergen Reactions  . Onion Rash       Objective:    BP 107/64 (BP Location: Left Arm, Patient Position: Sitting, Cuff Size: Normal)   Pulse 87   Temp 97.6 F (36.4 C) (Oral)   Ht 6' (1.829 m)   Wt 176 lb (79.8 kg)   SpO2 100%   BMI 23.87 kg/m  Wt Readings from Last 3 Encounters:  05/25/18 176 lb (79.8 kg)  04/11/18 160 lb (72.6 kg)  02/23/18 160 lb 9.6 oz (72.8 kg)    Physical Exam  Constitutional: He is oriented to person, place, and time. He appears well-developed and well-nourished. He is cooperative.  HENT:  Head: Normocephalic and atraumatic.  Eyes: EOM are normal.  Neck: Normal range of motion.  Cardiovascular: Normal rate, regular rhythm and normal heart sounds. Exam reveals no gallop and no friction rub.  No murmur heard. Pulmonary/Chest: Effort normal and breath sounds normal. No tachypnea. No respiratory distress. He has no decreased breath sounds. He has no wheezes. He has no rhonchi. He has no rales. Chest wall is not dull to percussion. He exhibits no mass, no tenderness, no bony tenderness, no laceration and no swelling.  Abdominal: Soft. Bowel sounds are normal.  Musculoskeletal: Normal range of motion. He exhibits no edema.  Neurological: He is alert and oriented to person, place, and time. Coordination normal.  Skin: Skin is warm and dry.  Psychiatric: He has a normal mood and affect. His behavior is normal. Judgment and thought content normal.  Nursing note and vitals  reviewed.      Patient has been counseled extensively about nutrition and exercise as well as the importance of adherence with medications and regular follow-up. The patient was given clear instructions to go to ER or return to medical center if symptoms don't improve, worsen or new problems develop. The patient verbalized understanding.   Follow-up: Return in about 2 weeks (around 06/08/2018) for BP recheck with luke.   Claiborne RiggZelda W Ameenah Prosser, FNP-BC [facility] [place] , [place] [phone]   05/25/2018, 1:38 PM

## 2018-05-25 NOTE — Progress Notes (Signed)
Patient results was inform by PCP in [place]

## 2018-05-26 LAB — VITAMIN D 25 HYDROXY (VIT D DEFICIENCY, FRACTURES): VIT D 25 HYDROXY: 13.1 ng/mL — AB (ref 30.0–100.0)

## 2018-05-30 ENCOUNTER — Encounter: Payer: Medicaid Other | Admitting: Infectious Diseases

## 2018-05-30 ENCOUNTER — Ambulatory Visit (INDEPENDENT_AMBULATORY_CARE_PROVIDER_SITE_OTHER): Payer: Medicaid Other | Admitting: Infectious Disease

## 2018-05-30 ENCOUNTER — Other Ambulatory Visit: Payer: Self-pay | Admitting: Nurse Practitioner

## 2018-05-30 ENCOUNTER — Encounter: Payer: Self-pay | Admitting: Infectious Disease

## 2018-05-30 ENCOUNTER — Encounter: Payer: Medicaid Other | Admitting: Internal Medicine

## 2018-05-30 VITALS — BP 126/84 | HR 99 | Wt 167.0 lb

## 2018-05-30 DIAGNOSIS — B20 Human immunodeficiency virus [HIV] disease: Secondary | ICD-10-CM | POA: Diagnosis not present

## 2018-05-30 DIAGNOSIS — A539 Syphilis, unspecified: Secondary | ICD-10-CM

## 2018-05-30 DIAGNOSIS — R61 Generalized hyperhidrosis: Secondary | ICD-10-CM | POA: Diagnosis not present

## 2018-05-30 MED ORDER — BICTEGRAVIR-EMTRICITAB-TENOFOV 50-200-25 MG PO TABS: 1.0000 | ORAL_TABLET | Freq: Every day | ORAL | 11 refills | Status: DC

## 2018-05-30 MED ORDER — VITAMIN D (ERGOCALCIFEROL) 1.25 MG (50000 UNIT) PO CAPS: 50000.0000 [IU] | ORAL_CAPSULE | ORAL | 0 refills | Status: DC

## 2018-05-30 NOTE — Progress Notes (Signed)
Subjective:   Complaint dizziness feeling out of breath also having problems with intense sweating.   Patient ID: Perry Norton, male    DOB: 08/31/1975, 42 y.o.   MRN: [id number]  HPI  This 42 year old African-American man with HIV that is very well controlled on Genvoya previously followed by Dr. Ninetta Lights scheduled me today.  He has a history of syphilis that was treated in [place] though his titers here have been 1-32 and I do not see a titer higher than 1-64 in quick glance of the records from [place] that is available through care everywhere.  He is going to get in touch with his physician in [place] and second talk to them one-on-one and understand where his initial titer was and if it felt by fourfold dilution as it should.  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  He is on multiple other medications which do not interact with his Genvoya but I worry about Genvoya and future drug drug interactions and think to be more prudent to change him to [employer].  Past Medical History:  Diagnosis Date  . HIV (human immunodeficiency virus infection) (HCC)   . Seizures (HCC)    epilepsy    No past surgical history on file.  Family History  Problem Relation Age of Onset  . Sarcoidosis Mother       Social History   Socioeconomic History  . Marital status: Single    Spouse name: Not on file  . Number of children: Not on file  . Years of education: Not on file  . Highest education level: Not on file  Occupational History  . Not on file  Social Needs  . Financial resource strain: Not on file  . Food insecurity:    Worry: Not on file    Inability: Not on file  . Transportation needs:    Medical: Not on file    Non-medical: Not on file  Tobacco Use  . Smoking status: Current Some Day Smoker    Packs/day: 1.00    Types: Cigarettes    Start date: 06/22/1994  . Smokeless tobacco: Never Used    Substance and Sexual Activity  . Alcohol use: Yes    Alcohol/week: 3.0 standard drinks    Types: 3 Cans of beer per week    Comment: occ  . Drug use: Yes    Types: Marijuana    Comment: denies use 05/03/17  . Sexual activity: Not on file  Lifestyle  . Physical activity:    Days per week: Not on file    Minutes per session: Not on file  . Stress: Not on file  Relationships  . Social connections:    Talks on phone: Not on file    Gets together: Not on file    Attends religious service: Not on file    Active member of club or organization: Not on file    Attends meetings of clubs or organizations: Not on file    Relationship status: Not on file  Other Topics Concern  . Not on file  Social History Narrative  . Not on file    Allergies  Allergen Reactions  . Onion Rash     Current Outpatient Medications:  .  divalproex (DEPAKOTE) 500 MG DR tablet, Take 2 tabs in AM, 1/2 tab at noon, 2 tabs in PM, Disp: 135 tablet, Rfl: 6 .  lacosamide (VIMPAT) 200 MG TABS tablet, Take  1 tablet (200 mg total) by mouth 2 (two) times daily., Disp: 60 tablet, Rfl: 5 .  losartan (COZAAR) 50 MG tablet, Take 1 tablet (50 mg total) by mouth daily., Disp: 90 tablet, Rfl: 3 .  sildenafil (VIAGRA) 25 MG tablet, Take 1 tablet (25 mg total) by mouth daily as needed for erectile dysfunction., Disp: 10 tablet, Rfl: 2 .  topiramate (TOPAMAX) 50 MG tablet, Take 1 tablet twice a day, Disp: 120 tablet, Rfl: 6 .  Vitamin D, Ergocalciferol, (DRISDOL) 50000 units CAPS capsule, Take 1 capsule (50,000 Units total) by mouth every 7 (seven) days., Disp: 16 capsule, Rfl: 0 .  bictegravir-emtricitabine-tenofovir AF (BIKTARVY) 50-200-25 MG TABS tablet, Take 1 tablet by mouth daily., Disp: 30 tablet, Rfl: 11    Review of Systems  Constitutional: Negative for activity change, appetite change, chills, diaphoresis, fatigue, fever and unexpected weight change.  HENT: Negative for congestion, rhinorrhea, sinus pressure,  sneezing, sore throat and trouble swallowing.   Eyes: Negative for photophobia and visual disturbance.  Respiratory: Positive for shortness of breath. Negative for cough, chest tightness, wheezing and stridor.   Cardiovascular: Negative for chest pain, palpitations and leg swelling.  Gastrointestinal: Negative for abdominal distention, abdominal pain, anal bleeding, blood in stool, constipation, diarrhea, nausea and vomiting.  Genitourinary: Negative for difficulty urinating, dysuria, flank pain and hematuria.  Musculoskeletal: Negative for arthralgias, back pain, gait problem, joint swelling and myalgias.  Skin: Negative for color change, pallor, rash and wound.  Neurological: Positive for dizziness. Negative for tremors, weakness and light-headedness.  Hematological: Negative for adenopathy. Does not bruise/bleed easily.  Psychiatric/Behavioral: Positive for decreased concentration. Negative for agitation, behavioral problems, confusion, dysphoric mood and sleep disturbance.       Objective:   Physical Exam  Constitutional: He is oriented to person, place, and time. He appears well-developed and well-nourished. He is cooperative. He does not appear ill. No distress.  HENT:  Head: Normocephalic and atraumatic.  Right Ear: Hearing and external ear normal.  Left Ear: Hearing and external ear normal.  Nose: No rhinorrhea or nasal deformity. No epistaxis.  Eyes: Pupils are equal, round, and reactive to light. Conjunctivae and EOM are normal. Right conjunctiva is not injected. Left conjunctiva is not injected. No scleral icterus.  Neck: Normal range of motion. Neck supple. No JVD present.  Cardiovascular: Normal rate, S1 normal and S2 normal.  Pulmonary/Chest: Effort normal. No respiratory distress.  Abdominal: Soft. Normal appearance. He exhibits no distension and no ascites. There is no hepatosplenomegaly.  Musculoskeletal: Normal range of motion.       Right shoulder: Normal.       Left  shoulder: Normal.       Right hip: Normal.       Left hip: Normal.       Right knee: Normal.       Left knee: Normal.  Lymphadenopathy:       Head (right side): No submandibular, no preauricular and no posterior auricular adenopathy present.       Head (left side): No submandibular, no preauricular and no posterior auricular adenopathy present.    He has no cervical adenopathy.       Right cervical: No superficial cervical and no deep cervical adenopathy present.      Left cervical: No superficial cervical and no deep cervical adenopathy present.  Neurological: He is alert and oriented to person, place, and time. He has normal strength. No sensory deficit. Coordination and gait normal.  Skin: Skin is warm, dry and  intact. No abrasion, no bruising, no ecchymosis, no lesion and no rash noted. He is not diaphoretic. No cyanosis or erythema. No pallor. Nails show no clubbing.  Psychiatric: He has a normal mood and affect. His speech is normal and behavior is normal. Judgment and thought content normal. Cognition and memory are normal. He is attentive.          Assessment & Plan:   HIV disease change to [facility] check labs in 2 months time  Seizure disorder continue current medications  Syphilis: Clarify what his initial titer was in [place] when he was initially diagnosed with syphilis 10 years ago previously.  We will discuss with his physician.

## 2018-06-03 ENCOUNTER — Telehealth (INDEPENDENT_AMBULATORY_CARE_PROVIDER_SITE_OTHER): Payer: Self-pay

## 2018-06-03 NOTE — Telephone Encounter (Signed)
Error message that call can not be completed as dialed. Perry Norton

## 2018-06-03 NOTE — Telephone Encounter (Signed)
-----   Message from Claiborne RiggZelda W Fleming, NP sent at 05/30/2018 10:30 PM EST ----- Vitamin D is low. Will need to send in a prescription to the pharmacy for you to take weekly. Will recheck your vitamin d in 3 months.

## 2018-06-06 ENCOUNTER — Telehealth: Payer: Self-pay | Admitting: *Deleted

## 2018-06-06 NOTE — Telephone Encounter (Signed)
Attempt to call patient with results.No answer, phone continues to ring. Will mail patient a letter.

## 2018-06-08 ENCOUNTER — Encounter: Payer: Medicaid Other | Admitting: Pharmacist

## 2018-06-09 ENCOUNTER — Ambulatory Visit: Payer: Medicaid Other | Attending: Nurse Practitioner | Admitting: Pharmacist

## 2018-06-09 ENCOUNTER — Encounter: Payer: Self-pay | Admitting: Pharmacist

## 2018-06-09 VITALS — BP 107/70 | HR 85

## 2018-06-09 DIAGNOSIS — F17210 Nicotine dependence, cigarettes, uncomplicated: Secondary | ICD-10-CM | POA: Diagnosis not present

## 2018-06-09 DIAGNOSIS — I10 Essential (primary) hypertension: Secondary | ICD-10-CM | POA: Insufficient documentation

## 2018-06-09 NOTE — Patient Instructions (Addendum)
Thank you for coming to see us today.   Blood pressure today is well-controlled.   Do not take losartan until instructed. Make an appointment to see Zelda in 1 month.   Limiting salt and caffeine, as well as exercising as able for at least 30 minutes for 5 days out of the week, can also help you lower your blood pressure.  Take your blood pressure at home if you are able. Please write down these numbers and bring them to your visits.  If you have any questions about medications, please call me [phone].  Franky MachoLuke

## 2018-06-10 ENCOUNTER — Encounter: Payer: Self-pay | Admitting: Pharmacist

## 2018-06-10 ENCOUNTER — Other Ambulatory Visit: Payer: Self-pay | Admitting: Infectious Diseases

## 2018-06-10 DIAGNOSIS — B20 Human immunodeficiency virus [HIV] disease: Secondary | ICD-10-CM

## 2018-06-10 NOTE — Progress Notes (Signed)
   S:    PCP: Zelda  Pt presents to the clinic for hypertension management. Patient was referred by Zelda on 05/25/18. BP was controlled at that time but pt admitted to smoking marijuana at that visit. He was instructed to refrain from this before appointments and follow-up for a BP re-check.   Today, pt denies chest paiun, shortness of breath, blurred vision or BLE edema. He does endorse being "high" today and has been smoking marijuana.  Patient denies adherence with medications. Reports not taking losartan for the past 2 weeks.   Current BP Medications include:   - losartan 50 mg   Dietary habits include: does not limit salt, denies drinking caffeine  Exercise habits include: does not exercise Family / Social history: no pertinent positive FHx, current some day smoker, reports drinking occasionally, smokes marijuana daily  Home BP readings: does not check at home  O:  L arm after 5 minutes rest: 107/70, HR 85 Last 3 Office BP readings: BP Readings from Last 3 Encounters:  06/09/18 107/70  05/30/18 126/84  05/25/18 107/64   BMET    Component Value Date/Time   NA 140 05/10/2018 1607   NA 141 04/11/2018 1550   K 4.0 05/10/2018 1607   CL 102 05/10/2018 1607   CO2 30 05/10/2018 1607   GLUCOSE 98 05/10/2018 1607   BUN 14 05/10/2018 1607   BUN 16 04/11/2018 1550   CREATININE 1.47 (H) 05/10/2018 1607   CALCIUM 9.4 05/10/2018 1607   GFRNONAA 54 (L) 04/11/2018 1550   GFRNONAA 50 (L) 01/06/2018 1006   GFRAA 63 04/11/2018 1550   GFRAA 58 (L) 01/06/2018 1006   Renal function: CrCl cannot be calculated (Patient's most recent lab result is older than the maximum 21 days allowed.).  Clinical ASCVD: No  The 10-year ASCVD risk score Denman George(Goff DC Jr., et al., 2013) is: 6%   Values used to calculate the score:     Age: [age] years     Sex: Male     Is Non-Hispanic African American: Yes     Diabetic: No     Tobacco smoker: Yes     Systolic Blood Pressure: 107 mmHg     Is BP treated:  Yes     HDL Cholesterol: 56 mg/dL     Total Cholesterol: 186 mg/dL  A/P: Hypertension longstanding currently controlled even while being without losartan. BP Goal <130/80 mmHg. Patient is not adherent with current medications. I have advised him to hold his losartan until PCP follow-up and he has been without losartan for two weeks now. He endorses being "high" at today's appointment. I have emphasized that he should refrain from smoking or drinking before his appointments so we can get accurate BP readings.   -Hold losartan for now.  -Counseled on lifestyle modifications for blood pressure control including reduced dietary sodium, increased exercise, adequate sleep  Results reviewed and written information provided. Total time in face-to-face counseling 10 minutes.   F/U Clinic Visit 07/11/2018.  Butch PennyLuke Van Ausdall, PharmD, CPP Clinical Pharmacist [facility] [phone]

## 2018-06-16 ENCOUNTER — Telehealth: Payer: Self-pay | Admitting: *Deleted

## 2018-06-16 NOTE — Telephone Encounter (Signed)
Pt name and DOB verified. Pt aware of his results. Verbalized understanding of lab results and medications.

## 2018-06-26 ENCOUNTER — Encounter (HOSPITAL_COMMUNITY): Payer: Self-pay | Admitting: Emergency Medicine

## 2018-06-26 ENCOUNTER — Emergency Department (HOSPITAL_COMMUNITY): Payer: Medicaid Other

## 2018-06-26 ENCOUNTER — Emergency Department (HOSPITAL_COMMUNITY)
Admission: EM | Admit: 2018-06-26 | Discharge: 2018-06-26 | Disposition: A | Discharge status: Home / Self Care | Payer: Medicaid Other | Attending: Emergency Medicine | Admitting: Emergency Medicine

## 2018-06-26 DIAGNOSIS — H543 Unqualified visual loss, both eyes: Secondary | ICD-10-CM | POA: Diagnosis not present

## 2018-06-26 DIAGNOSIS — Z79899 Other long term (current) drug therapy: Secondary | ICD-10-CM | POA: Diagnosis not present

## 2018-06-26 DIAGNOSIS — H547 Unspecified visual loss: Secondary | ICD-10-CM | POA: Diagnosis not present

## 2018-06-26 DIAGNOSIS — F17210 Nicotine dependence, cigarettes, uncomplicated: Secondary | ICD-10-CM | POA: Insufficient documentation

## 2018-06-26 DIAGNOSIS — B20 Human immunodeficiency virus [HIV] disease: Secondary | ICD-10-CM | POA: Diagnosis not present

## 2018-06-26 DIAGNOSIS — R569 Unspecified convulsions: Secondary | ICD-10-CM | POA: Insufficient documentation

## 2018-06-26 DIAGNOSIS — H5789 Other specified disorders of eye and adnexa: Secondary | ICD-10-CM | POA: Diagnosis present

## 2018-06-26 DIAGNOSIS — R51 Headache: Secondary | ICD-10-CM | POA: Insufficient documentation

## 2018-06-26 DIAGNOSIS — R9431 Abnormal electrocardiogram [ECG] [EKG]: Secondary | ICD-10-CM | POA: Diagnosis not present

## 2018-06-26 LAB — CBC WITH DIFFERENTIAL/PLATELET
Abs Immature Granulocytes: 0.04 10*3/uL (ref 0.00–0.07)
Basophils Absolute: 0.1 10*3/uL (ref 0.0–0.1)
Basophils Relative: 1 %
EOS PCT: 1 %
Eosinophils Absolute: 0.1 10*3/uL (ref 0.0–0.5)
HCT: 45.8 % (ref 39.0–52.0)
HEMOGLOBIN: 14.8 g/dL (ref 13.0–17.0)
Immature Granulocytes: 1 %
Lymphocytes Relative: 34 %
Lymphs Abs: 2 10*3/uL (ref 0.7–4.0)
MCH: 29.5 pg (ref 26.0–34.0)
MCHC: 32.3 g/dL (ref 30.0–36.0)
MCV: 91.4 fL (ref 80.0–100.0)
MONO ABS: 0.7 10*3/uL (ref 0.1–1.0)
Monocytes Relative: 11 %
Neutro Abs: 3.1 10*3/uL (ref 1.7–7.7)
Neutrophils Relative %: 52 %
Platelets: 165 10*3/uL (ref 150–400)
RBC: 5.01 MIL/uL (ref 4.22–5.81)
RDW: 13.2 % (ref 11.5–15.5)
WBC: 6 10*3/uL (ref 4.0–10.5)
nRBC: 0 % (ref 0.0–0.2)

## 2018-06-26 LAB — COMPREHENSIVE METABOLIC PANEL
ALT: 12 U/L (ref 0–44)
AST: 32 U/L (ref 15–41)
Albumin: 4.4 g/dL (ref 3.5–5.0)
Alkaline Phosphatase: 63 U/L (ref 38–126)
Anion gap: 9 (ref 5–15)
BUN: 14 mg/dL (ref 6–20)
CALCIUM: 9.6 mg/dL (ref 8.9–10.3)
CO2: 24 mmol/L (ref 22–32)
Chloride: 105 mmol/L (ref 98–111)
Creatinine, Ser: 1.58 mg/dL — ABNORMAL HIGH (ref 0.61–1.24)
GFR calc Af Amer: >60 mL/min (ref >60)
GFR calc non Af Amer: 54 mL/min — ABNORMAL LOW (ref >60)
Glucose, Bld: 103 mg/dL — ABNORMAL HIGH (ref 70–99)
Potassium: 4.9 mmol/L (ref 3.5–5.1)
Sodium: 138 mmol/L (ref 135–145)
Total Bilirubin: 1.2 mg/dL (ref 0.3–1.2)
Total Protein: 7.4 g/dL (ref 6.5–8.1)

## 2018-06-26 LAB — VALPROIC ACID LEVEL: Valproic Acid Lvl: <10 ug/mL — ABNORMAL LOW (ref 50.0–100.0)

## 2018-06-26 NOTE — ED Notes (Signed)
Mom stated, he said his eyes are burning, and he has seizures, and if he has a seizure I want to make sure yall can help me.  Pt did  Not take his seizure med this morning. Stated to mom we will be right here, and will get him back as soon as possible.

## 2018-06-26 NOTE — Discharge Instructions (Signed)
Please return if symptoms worsen.  Follow up with your doctors.  Your depakote today is low so make sure you take your normal dose here.

## 2018-06-26 NOTE — ED Notes (Signed)
Pt found walking out of room and stating he refuses to wait for his papers to be printed. Pt ambulatory to lobby without difficulties. EDP notified.

## 2018-06-26 NOTE — ED Notes (Signed)
ED Provider at bedside. 

## 2018-06-26 NOTE — ED Triage Notes (Signed)
Pt. Stated, I can't see since this morning.  When I put my hand across his eyes without touching he blinked.

## 2018-06-26 NOTE — ED Notes (Signed)
Patient family member to desk stating that they went outside to smoke and patient's eyes have started to burn more since returning inside. RN notified

## 2018-06-26 NOTE — ED Notes (Addendum)
Pt ambulatory at bedside. Pt got himself dressed. Pt refused d/c vital signs.

## 2018-06-26 NOTE — ED Notes (Signed)
Some seizure-like activity noticed by [place], Charity fundraiser. Pt has hx of seizures, pt did not take his depakote this morning.

## 2018-06-26 NOTE — ED Provider Notes (Signed)
[facility] EMERGENCY DEPARTMENT Provider Note   CSN: [id number] Arrival date & time: 06/26/18  1015     History   Chief Complaint Chief Complaint  Patient presents with  . Eye Problem    HPI Perry Norton is a 43 y.o. male.  Patient is a 43 year old male with a history of well-controlled HIV on medication with undetectable viral load, seizure disorder on Depakote, prior syphilis who is presenting today with his wife for vision loss.  Patient states yesterday was a normal day but when he woke up this morning he could not see out of either eye.  He has a mild headache.  He denies any trauma.  Wife states his last seizure was on 24 December.  He has not taken any medication today but did take his medication yesterday.  He has not had fever or symptoms of infectious illness.  He has never had similar problems in the past.  He denies any neck pain.  Patient did have a seizure while he was waiting in the waiting room and was brought back immediately.  Wife states that he does smoke marijuana but does not drink excessive alcohol or use other drugs.  The history is provided by the patient.  Eye Problem   This is a new problem. Episode onset: Noticed when he woke up this morning he could not see. The problem occurs constantly. The problem has not changed since onset.There is a problem in both eyes. There was no injury mechanism (History of seizure disorders but last seizure per his wife was 24 December.  However patient did have a seizure in the waiting room.  Currently he complains that everything looks like white fuzz in both eyes.). The pain is at a severity of 4/10. The pain is moderate. There is no history of trauma to the eye. Associated symptoms include decreased vision. Pertinent negatives include no numbness, no photophobia, no nausea and no vomiting. He has tried nothing for the symptoms. The treatment provided no relief.    Past Medical History:  Diagnosis Date  . HIV  (human immunodeficiency virus infection) (HCC)   . Seizures (HCC)    epilepsy    Patient Active Problem List   Diagnosis Date Noted  . Diaphoresis 01/19/2018  . ED (erectile dysfunction) 01/19/2018  . Left arm pain 10/18/2017  . Localization-related idiopathic epilepsy and epileptic syndromes with seizures of localized onset, not intractable, without status epilepticus (HCC) 07/22/2017  . Intractable migraine without aura and without status migrainosus 07/22/2017  . Chronic pain syndrome 07/22/2017  . Pleurisy 07/05/2017  . AC separation, type 2, left, initial encounter 05/25/2017  . CRI (chronic renal insufficiency) 05/20/2017  . Syphilis 05/20/2017  . Poor dentition 05/20/2017  . Pharyngeal irritation 12/05/2015  . Wheeze 12/05/2015  . Head trauma 06/10/2015  . 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 Seizure (HCC) 06/10/2015  . Brachial plexopathy 06/07/2014  . Nausea 03/19/2014  . Right arm weakness 03/02/2014  . Disseminated zoster 04/09/2011  . Human immunodeficiency virus (HIV) disease (HCC) 11/06/2010  . Numbness and tingling of right arm 11/06/2010    History reviewed. No pertinent surgical history.      Home Medications    Prior to Admission medications   Medication Sig Start Date End Date Taking? Authorizing Provider  bictegravir-emtricitabine-tenofovir AF (BIKTARVY) 50-200-25 MG TABS tablet Take 1 tablet by mouth daily. 05/30/18   Randall Hiss, MD  divalproex (DEPAKOTE) 500 MG DR tablet Take 2 tabs in AM, 1/2  tab at noon, 2 tabs in PM 07/21/17   Van Clines, MD  lacosamide (VIMPAT) 200 MG TABS tablet Take 1 tablet (200 mg total) by mouth 2 (two) times daily. 07/21/17   Van Clines, MD  losartan (COZAAR) 50 MG tablet Take 1 tablet (50 mg total) by mouth daily. 09/03/17   Claiborne Rigg, NP  sildenafil (VIAGRA) 25 MG tablet Take 1 tablet (25 mg total) by mouth daily as needed for erectile dysfunction. 01/19/18   Ginnie Smart, MD  topiramate  (TOPAMAX) 50 MG tablet Take 1 tablet twice a day 09/03/17   Van Clines, MD  Vitamin D, Ergocalciferol, (DRISDOL) 1.25 MG (50000 UT) CAPS capsule Take 1 capsule (50,000 Units total) by mouth every 7 (seven) days. 05/30/18   Claiborne Rigg, NP    Family History Family History  Problem Relation Age of Onset  . Sarcoidosis Mother     Social History Social History   Tobacco Use  . Smoking status: Current Some Day Smoker    Packs/day: 1.00    Types: Cigarettes    Start date: 06/22/1994  . Smokeless tobacco: Never Used  Substance Use Topics  . Alcohol use: Yes    Alcohol/week: 3.0 standard drinks    Types: 3 Cans of beer per week    Comment: occ  . Drug use: Yes    Types: Marijuana     Allergies   Onion   Review of Systems Review of Systems  Eyes: Negative for photophobia.  Gastrointestinal: Negative for nausea and vomiting.  Neurological: Negative for numbness.  All other systems reviewed and are negative.    Physical Exam Updated Vital Signs BP 136/89   Pulse 75   Temp (!) 97.4 F (36.3 C) (Oral)   Resp 16   Ht 6' (1.829 m)   Wt 78.5 kg   SpO2 100%   BMI 23.46 kg/m   Physical Exam Vitals signs and nursing note reviewed.  Constitutional:      General: He is not in acute distress.    Appearance: He is well-developed.     Comments: Initially mild foaming at the mouth and slightly confused but that cleared.  HENT:     Head: Normocephalic and atraumatic.  Eyes:     General: Visual field deficit present.     Extraocular Movements: Extraocular movements intact.     Conjunctiva/sclera: Conjunctivae normal.     Pupils: Pupils are equal, round, and reactive to light.     Comments: Patient's eyes can move in all directions on exam but with threat to the eye he has no blinking and states he cannot see anything.  He cannot perceive light.  Neck:     Musculoskeletal: Normal range of motion and neck supple.  Cardiovascular:     Rate and Rhythm: Normal rate and  regular rhythm.     Heart sounds: No murmur.  Pulmonary:     Effort: Pulmonary effort is normal. No respiratory distress.     Breath sounds: Normal breath sounds. No wheezing or rales.  Abdominal:     General: There is no distension.     Palpations: Abdomen is soft.     Tenderness: There is no abdominal tenderness. There is no guarding or rebound.  Musculoskeletal: Normal range of motion.        General: No tenderness.  Skin:    General: Skin is warm and dry.     Findings: No erythema or rash.  Neurological:  Mental Status: He is alert and oriented to person, place, and time.     Sensory: Sensation is intact.     Motor: Motor function is intact. No weakness, tremor or pronator drift.     Coordination: Coordination normal.     Comments: With threat towards the eye patient does not blink.  He reports that he does not perceive light or anything except white follows  Psychiatric:        Behavior: Behavior normal.      ED Treatments / Results  Labs (all labs ordered are listed, but only abnormal results are displayed) Labs Reviewed  COMPREHENSIVE METABOLIC PANEL - Abnormal; Notable for the following components:      Result Value   Glucose, Bld 103 (*)    Creatinine, Ser 1.58 (*)    GFR calc non Af Amer 54 (*)    All other components within normal limits  VALPROIC ACID LEVEL - Abnormal; Notable for the following components:   Valproic Acid Lvl <10 (*)    All other components within normal limits  CBC WITH DIFFERENTIAL/PLATELET    EKG EKG Interpretation  Date/Time:  Sunday June 26 2018 13:16:25 EST Ventricular Rate:  58 PR Interval:    QRS Duration: 101 QT Interval:  431 QTC Calculation: 424 R Axis:   64 Text Interpretation:  Sinus rhythm Probable left atrial enlargement Left ventricular hypertrophy No significant change since last tracing Confirmed by Gwyneth SproutPlunkett, Gesenia Bantz ([id number]) on 06/26/2018 1:19:24 PM   Radiology Ct Head Wo Contrast  Result Date:  06/26/2018 CLINICAL DATA:  Seizure. EXAM: CT HEAD WITHOUT CONTRAST TECHNIQUE: Contiguous axial images were obtained from the base of the skull through the vertex without intravenous contrast. COMPARISON:  MRI of the brain on 08/03/2017 FINDINGS: Brain: No evidence of acute infarction, hemorrhage, hydrocephalus, extra-axial collection or mass lesion/mass effect. Vascular: No hyperdense vessel or unexpected calcification. Skull: Normal. Negative for fracture or focal lesion. Sinuses/Orbits: No acute finding. Other: None. IMPRESSION: Normal head CT. Electronically Signed   By: Irish LackGlenn  Yamagata M.D.   On: 06/26/2018 14:57    Procedures Procedures (including critical care time)  Medications Ordered in ED Medications - No data to display   Initial Impression / Assessment and Plan / ED Course  I have reviewed the triage vital signs and the nursing notes.  Pertinent labs & imaging results that were available during my care of the patient were reviewed by me and considered in my medical decision making (see chart for details).     Patient with history of HIV well-controlled on medications as well as seizure on Depakote presenting today with acute vision loss.  He does complain of a mild headache but does not have severe eye pain.  It involves both eyes.  Patient did have a generalized seizure in the waiting room which resolved spontaneously.  He is slightly postictal in the exam room but easily directable.  He states he still cannot see anything out of his eyes.  He is supposed to wear glasses but has never had anything like this before.  He does not blink with threat and states he cannot perceive light or any shapes.  Pupils are reactive and extraocular movements seem to be intact.  Vital signs are reassuring.  Head CT, CBC, CMP, valproic acid pending.  Lower suspicion that this is methanol toxicity.  Patient is not presenting to like a toxic alcohol ingestion (no tachycardia or hypertension, AMS) and no  anion gap on CMP.  Cr at baseline and  CBC wnl.  3:08 PM Precode level is undetectable.  Patient's head CT is negative.  When I got back to the room the patient has dressed himself and is threatening to rip his IV out and states he is ready to go.  He states his vision is a little bit blurry but better.  Asked the patient if he would be willing to stay around to see a neurologist and he states no he is ready to leave.  Patient was able to walk out without difficulty does not appear to be having vision issues at this time.  Encouraged patient to follow-up with his PCP or return with further problems. Final Clinical Impressions(s) / ED Diagnoses   Final diagnoses:  Decreased vision in both eyes    ED Discharge Orders    None       Gwyneth Sprout, MD 06/26/18 [phone]

## 2018-06-26 NOTE — ED Notes (Addendum)
Pt returned from CT and pt stating he wants to leave if his wife is back. Pt ripping off all of his leads and stating he was going to rip out his IV. This RN requested pt wait until his results are back and that he leave his IV in place. Wife not at bedside at this time. Will continue to monitor. Pt reports improved vision.

## 2018-06-29 ENCOUNTER — Telehealth: Payer: Self-pay | Admitting: Neurology

## 2018-06-29 NOTE — Telephone Encounter (Signed)
Patient went to the ED Sunday morning, he woke up and was blind he had two seizures while in the ED and the CT came back good and did blood work there. His sight came back later night, he would  Like to speak to someone about what is going

## 2018-06-30 NOTE — Telephone Encounter (Signed)
ED notes state that CT was negative. Pt had seizure in waiting room. Vision came back while in ED.  Pt dressed himself, attempted to rip out IV's and walked out.  Depakote level <10.  pls advise.

## 2018-07-01 ENCOUNTER — Telehealth: Payer: Self-pay | Admitting: Nurse Practitioner

## 2018-07-01 NOTE — Telephone Encounter (Signed)
FYI - Right after he called our office, he called his PCP asking for referral to another Neurologist.

## 2018-07-01 NOTE — Telephone Encounter (Signed)
Pt would like a referral to a new neurologist. Call pt [phone].

## 2018-07-01 NOTE — Telephone Encounter (Signed)
Patient is calling in again. Please call him once Dr.Aquino responds. I let him know we were waiting to hear back from her with info.

## 2018-07-01 NOTE — Telephone Encounter (Signed)
Will route to PCP for review. 

## 2018-07-03 ENCOUNTER — Other Ambulatory Visit: Payer: Self-pay | Admitting: Nurse Practitioner

## 2018-07-03 DIAGNOSIS — G40009 Localization-related (focal) (partial) idiopathic epilepsy and epileptic syndromes with seizures of localized onset, not intractable, without status epilepticus: Secondary | ICD-10-CM

## 2018-07-04 NOTE — Telephone Encounter (Signed)
Pt called back as I was clocking in from lunch.   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  I asked how he was taking his Depakote - pt states "like I'm suppose to.  All day every day."  But would not give me # of tabs taken at each time.  I asked which doctor has been sending his Rx for Depakote.  Pt stated that he "had a refill that I never picked up so I filled it"  Pt was verbally upset that I could not answer his questions.  I advised that his CT and labwork from the ED all came back normal.  Pt stated "I know, everything was perfect except my Depakote.  You aren't telling me anything I don't already know."  Pt went on to say "So what do I need to do to get some answers?  Do I need to have my PCP send a referral to another Neurologist?"  I advised that Dr. Meredeth Ide sent an ambulatory referral to neurology just yesterday and that he will most likely receive a call shortly about scheduling his NP appointment.  Pt did not seem to like this as he stated "well, you guys don't do anything for me that's why I asked for the referral" and hung up on me.

## 2018-07-04 NOTE — Telephone Encounter (Signed)
How is he taking his medications? Looks like last time meds were refilled from our office was January 2019? If he would like second opinion from different neurologist, that is fine. Thanks

## 2018-07-06 ENCOUNTER — Encounter: Payer: Self-pay | Admitting: Neurology

## 2018-07-06 NOTE — Telephone Encounter (Signed)
Dismissal letter processed and I will forward to you for signature. Upon your signature letter will be sent to [facility] at [facility] for processing and mailing.

## 2018-07-06 NOTE — Telephone Encounter (Signed)
Forwarding to Dr. Aquino as FYI 

## 2018-07-06 NOTE — Telephone Encounter (Signed)
Noted. Patient has expressed several times lack of trust in our practice, he has an appointment scheduled with new neurologist. Due to loss of patient-physician trust/relationship, patient will be dismissed from our practice.

## 2018-07-07 ENCOUNTER — Other Ambulatory Visit: Payer: Medicaid Other

## 2018-07-07 ENCOUNTER — Telehealth: Payer: Self-pay | Admitting: *Deleted

## 2018-07-07 ENCOUNTER — Ambulatory Visit: Payer: Medicaid Other | Admitting: Neurology

## 2018-07-07 NOTE — Telephone Encounter (Addendum)
Late entry from today at 9:00am.  The patient was on Dr. Zannie Cove schedule for today at 11:30am.  He had a seizure on 06/26/2018 and was treated in the ED.  His Depakote level was found to be less than 10.  It was also less than 10 when checked nine months ago.  He appears to be non-compliant with his medications.  He has been seeing Dr. Karel Jarvis but had requested a change in providers due to lack of trust in their office (per previous note).  After review of  patient's chart, Dr. Terrace Arabia does not feel she has any more to offer him medically.  He should take his medication, as prescribed, to prevent seizures.  I called the patient to explain this situation to him.  I also educated the patient on the importance of taking his medication and not missing any doses.  He mentioned having neck pain that needed to be evaluated.  I instructed him to see his PCP for an initial evaluation of neck pain, explaining that the PCP will be able to determine the best type specialist he needs to see for this problem.  The phone call in a pleasant manner but it transitioned to him verbally expressed his dissatisfaction with this plan, using profrane language with me.  When I requested he not use profanity, the call disconnected on his end abruptly.  Per Dr. Terrace Arabia, due to this behavior, he will not be rescheduled with any of the providers at this office.

## 2018-07-11 ENCOUNTER — Ambulatory Visit: Payer: Medicaid Other | Attending: Nurse Practitioner | Admitting: Nurse Practitioner

## 2018-07-11 ENCOUNTER — Encounter: Payer: Self-pay | Admitting: Nurse Practitioner

## 2018-07-11 ENCOUNTER — Telehealth: Payer: Self-pay | Admitting: Neurology

## 2018-07-11 VITALS — BP 100/65 | HR 88 | Temp 97.9°F | Resp 16 | Ht 72.0 in | Wt 174.4 lb

## 2018-07-11 DIAGNOSIS — F17200 Nicotine dependence, unspecified, uncomplicated: Secondary | ICD-10-CM

## 2018-07-11 DIAGNOSIS — H547 Unspecified visual loss: Secondary | ICD-10-CM | POA: Insufficient documentation

## 2018-07-11 DIAGNOSIS — H543 Unqualified visual loss, both eyes: Secondary | ICD-10-CM

## 2018-07-11 DIAGNOSIS — F17210 Nicotine dependence, cigarettes, uncomplicated: Secondary | ICD-10-CM | POA: Diagnosis not present

## 2018-07-11 DIAGNOSIS — Z79899 Other long term (current) drug therapy: Secondary | ICD-10-CM | POA: Diagnosis not present

## 2018-07-11 DIAGNOSIS — Z91018 Allergy to other foods: Secondary | ICD-10-CM | POA: Diagnosis not present

## 2018-07-11 DIAGNOSIS — B20 Human immunodeficiency virus [HIV] disease: Secondary | ICD-10-CM | POA: Insufficient documentation

## 2018-07-11 NOTE — Progress Notes (Signed)
Assessment & Plan:  Diagnoses and all orders for this visit:  Vision loss, bilateral Currently resolved.  -     Ambulatory referral to Ophthalmology  Tobacco dependence Perry Norton was counseled on the dangers of tobacco use, and was advised to quit. Reviewed strategies to maximize success, including removing cigarettes and smoking materials from environment, stress management and support of family/friends as well as pharmacological alternatives including: Wellbutrin, Chantix, Nicotine patch, Nicotine gum or lozenges. Smoking cessation support: smoking cessation hotline: 1-800-QUIT-NOW.  Smoking cessation classes are also available through [facility]. Call 808-673-4080 or visit our website at [email or web address].   A total of 5 minutes was spent on counseling for smoking cessation and Perry Norton is not ready to quit.     Patient has been counseled on age-appropriate routine health concerns for screening and prevention. These are reviewed and up-to-date. Referrals have been placed accordingly. Immunizations are up-to-date or declined.    Subjective:  HPI Perry Norton 43 y.o. male presents to office today with no concerns. He states he is here for a routine follow up however at his last office visit in December he was instructed to follow up as needed.   VIsion Loss He was seen in the ER on 06-26-2018. States he woke up 2 weeks ago with complete vision loss and bilateral eye pain. Per review of ER notes; patient sustained a seizure in the waiting room.Valproic acid level was subtherapeutic. Likely related to noncompliance. Head CT was negative.  Patient was asked if he would be willing to stay around to see a neurologist and he stated no he was ready to leave.  Patient was able to walk out without difficulty did not appear to have vision issues upon discharge. Today he states his vision has cleared he denies floaters, nausea, vomiting, or increased headaches. He smells strongly  of marijuana today and is falling asleep during the office visit. He will be referred to eye specialist at this time.     Review of Systems  Constitutional: Negative for fever, malaise/fatigue and weight loss.  HENT: Negative.  Negative for nosebleeds.   Eyes: Negative.  Negative for blurred vision, double vision and photophobia.  Respiratory: Negative.  Negative for cough and shortness of breath.   Cardiovascular: Negative.  Negative for chest pain, palpitations and leg swelling.  Gastrointestinal: Negative.  Negative for heartburn, nausea and vomiting.  Musculoskeletal: Negative.  Negative for myalgias.  Neurological: Positive for seizures. Negative for dizziness, focal weakness and headaches.  Psychiatric/Behavioral: Negative.  Negative for suicidal ideas.    Past Medical History:  Diagnosis Date  . HIV (human immunodeficiency virus infection) (HCC)   . Seizures (HCC)    epilepsy    History reviewed. No pertinent surgical history.  Family History  Problem Relation Age of Onset  . Sarcoidosis Mother     Social History Reviewed with no changes to be made today.   Outpatient Medications Prior to Visit  Medication Sig Dispense Refill  . bictegravir-emtricitabine-tenofovir AF (BIKTARVY) 50-200-25 MG TABS tablet Take 1 tablet by mouth daily. 30 tablet 11  . divalproex (DEPAKOTE) 500 MG DR tablet Take 2 tabs in AM, 1/2 tab at noon, 2 tabs in PM 135 tablet 6  . lacosamide (VIMPAT) 200 MG TABS tablet Take 1 tablet (200 mg total) by mouth 2 (two) times daily. 60 tablet 5  . losartan (COZAAR) 50 MG tablet Take 1 tablet (50 mg total) by mouth daily. 90 tablet 3  . sildenafil (VIAGRA) 25  MG tablet Take 1 tablet (25 mg total) by mouth daily as needed for erectile dysfunction. 10 tablet 2  . topiramate (TOPAMAX) 50 MG tablet Take 1 tablet twice a day 120 tablet 6  . Vitamin D, Ergocalciferol, (DRISDOL) 1.25 MG (50000 UT) CAPS capsule Take 1 capsule (50,000 Units total) by mouth every 7  (seven) days. 16 capsule 0   No facility-administered medications prior to visit.     Allergies  Allergen Reactions  . Onion Rash       Objective:    BP 100/65   Pulse 88   Temp 97.9 F (36.6 C) (Oral)   Resp 16   Ht 6' (1.829 m)   Wt 174 lb 6.4 oz (79.1 kg)   SpO2 98%   BMI 23.65 kg/m  Wt Readings from Last 3 Encounters:  07/11/18 174 lb 6.4 oz (79.1 kg)  06/26/18 173 lb (78.5 kg)  05/30/18 167 lb (75.8 kg)    Physical Exam Vitals signs and nursing note reviewed.  Constitutional:      Appearance: He is well-developed.  HENT:     Head: Normocephalic and atraumatic.  Neck:     Musculoskeletal: Normal range of motion.  Cardiovascular:     Rate and Rhythm: Normal rate and regular rhythm.     Heart sounds: Normal heart sounds. No murmur. No friction rub. No gallop.   Pulmonary:     Effort: Pulmonary effort is normal. No tachypnea or respiratory distress.     Breath sounds: Normal breath sounds. No decreased breath sounds, wheezing, rhonchi or rales.  Chest:     Chest wall: No tenderness.  Abdominal:     General: Bowel sounds are normal.     Palpations: Abdomen is soft.  Musculoskeletal: Normal range of motion.  Skin:    General: Skin is warm and dry.  Neurological:     Mental Status: He is alert and oriented to person, place, and time.     Coordination: Coordination normal.  Psychiatric:        Behavior: Behavior normal. Behavior is cooperative.        Thought Content: Thought content normal.        Judgment: Judgment normal.          Patient has been counseled extensively about nutrition and exercise as well as the importance of adherence with medications and regular follow-up. The patient was given clear instructions to go to ER or return to medical center if symptoms don't improve, worsen or new problems develop. The patient verbalized understanding.   Follow-up: No follow-ups on file.   Claiborne RiggZelda W Fleming, FNP-BC [facility]  [facility] [place], [place] [phone]   07/11/2018, 11:43 AM

## 2018-07-11 NOTE — Telephone Encounter (Signed)
Patient dismissed from [facility] by Patrcia Dolly MD, effective 07/06/2018. Dismissal letter sent out by 1st class mail. LM

## 2018-07-11 NOTE — Patient Instructions (Signed)
Blurred Vision, Adult              Having blurred vision means that you cannot see things clearly. Your vision may seem fuzzy or out of focus. It can involve your vision for objects that are close or far away. It may affect one or both eyes. There are many causes of blurred vision, including cataracts, macular degeneration, eye inflammation (uveitis), and diabetic retinopathy.  In many cases, blurred vision has to do with the shape of your eye. An abnormal eye shape means you cannot focus well (refractive error). When this happens, it can cause:  · Faraway objects to look blurry (nearsightedness).  · Close objects to look blurry (farsightedness).  · Blurry vision at any distance (astigmatism).  Refractive errors are often corrected with glasses or contacts.  Blurred vision can be diagnosed based on your symptoms and a physical exam. Tell your health care provider about any other health problems you have, any recent eye injury, and any prior surgeries. You may need to see a health care provider who specializes in eye problems (ophthalmologist). Your treatment will depend on what is causing your blurred vision.  Follow these instructions at home:  · Keep all follow-up visits as told by your health care provider. This is important. These include any visits to your eye specialists.  · Do not drive or use heavy machinery if your vision is blurry.  · Use eye drops only as told by your health care provider.  · If you were prescribed glasses or contact lenses, wear the glasses or contacts as told by your health care provider.  · Schedule eye exams regularly.  · Pay attention to any changes in your symptoms.  Contact a health care provider if:  · Your symptoms do not improve or they get worse.  · You have:  ? New symptoms.  ? A headache.  ? Trouble seeing at night.  ? Trouble noticing the difference between colors.  · You notice:  ? Drooping of your eyelids.  ? Drainage coming from your eyes.  ? A rash around your eyes.  Get  help right away if:  · You have:  ? Severe eye pain.  ? A severe headache.  ? A sudden change in vision.  ? A sudden loss of vision.  ? A vision change after an injury.  · You notice flashing lights in your field of vision. Your field of vision is the area that you can see without moving your eyes.  Summary  · Having blurred vision means that you cannot see things clearly. Your vision may seem fuzzy or out of focus.  · There are many causes of blurred vision. In many cases, blurred vision has to do with an abnormal eye shape (refractive error), and it can be corrected with glasses or contact lenses.  · Pay attention to any changes in your symptoms. Contact a health care provider if your symptoms do not improve or if you have any new symptoms.  This information is not intended to replace advice given to you by your health care provider. Make sure you discuss any questions you have with your health care provider.  Document Released: 06/11/2003 Document Revised: 09/25/2016 Document Reviewed: 09/25/2016  Elsevier Interactive Patient Education © 2019 Elsevier Inc.

## 2018-07-21 ENCOUNTER — Encounter: Payer: Medicaid Other | Admitting: Infectious Diseases

## 2018-07-21 ENCOUNTER — Encounter: Payer: Medicaid Other | Admitting: Family

## 2018-07-26 ENCOUNTER — Other Ambulatory Visit: Payer: Medicaid Other

## 2018-07-26 DIAGNOSIS — A539 Syphilis, unspecified: Secondary | ICD-10-CM | POA: Diagnosis not present

## 2018-07-26 DIAGNOSIS — R61 Generalized hyperhidrosis: Secondary | ICD-10-CM

## 2018-07-26 DIAGNOSIS — B20 Human immunodeficiency virus [HIV] disease: Secondary | ICD-10-CM

## 2018-07-27 LAB — T-HELPER CELL (CD4) - (RCID CLINIC ONLY)
CD4 % Helper T Cell: 30 % — ABNORMAL LOW (ref 33–55)
CD4 T Cell Abs: 590 /uL (ref 400–2700)

## 2018-07-28 LAB — CBC WITH DIFFERENTIAL/PLATELET
Absolute Monocytes: 621 cells/uL (ref 200–950)
Basophils Absolute: 58 cells/uL (ref 0–200)
Basophils Relative: 0.9 %
Eosinophils Absolute: 122 cells/uL (ref 15–500)
Eosinophils Relative: 1.9 %
HCT: 42.6 % (ref 38.5–50.0)
Hemoglobin: 14.2 g/dL (ref 13.2–17.1)
Lymphs Abs: 1920 cells/uL (ref 850–3900)
MCH: 29.6 pg (ref 27.0–33.0)
MCHC: 33.3 g/dL (ref 32.0–36.0)
MCV: 88.9 fL (ref 80.0–100.0)
MONOS PCT: 9.7 %
MPV: 11.2 fL (ref 7.5–12.5)
Neutro Abs: 3680 cells/uL (ref 1500–7800)
Neutrophils Relative %: 57.5 %
Platelets: 182 10*3/uL (ref 140–400)
RBC: 4.79 10*6/uL (ref 4.20–5.80)
RDW: 13 % (ref 11.0–15.0)
Total Lymphocyte: 30 %
WBC: 6.4 10*3/uL (ref 3.8–10.8)

## 2018-07-28 LAB — COMPLETE METABOLIC PANEL WITH GFR
AG Ratio: 1.8 (calc) (ref 1.0–2.5)
ALT: 17 U/L (ref 9–46)
AST: 18 U/L (ref 10–40)
Albumin: 4.4 g/dL (ref 3.6–5.1)
Alkaline phosphatase (APISO): 66 U/L (ref 36–130)
BUN / CREAT RATIO: 6 (calc) (ref 6–22)
BUN: 11 mg/dL (ref 7–25)
CO2: 30 mmol/L (ref 20–32)
Calcium: 9.2 mg/dL (ref 8.6–10.3)
Chloride: 103 mmol/L (ref 98–110)
Creat: 1.7 mg/dL — ABNORMAL HIGH (ref 0.60–1.35)
GFR, Est African American: 57 mL/min/{1.73_m2} — ABNORMAL LOW (ref >=60)
GFR, Est Non African American: 49 mL/min/{1.73_m2} — ABNORMAL LOW (ref >=60)
Globulin: 2.4 g/dL (calc) (ref 1.9–3.7)
Glucose, Bld: 85 mg/dL (ref 65–99)
POTASSIUM: 4.2 mmol/L (ref 3.5–5.3)
Sodium: 140 mmol/L (ref 135–146)
TOTAL PROTEIN: 6.8 g/dL (ref 6.1–8.1)
Total Bilirubin: 0.5 mg/dL (ref 0.2–1.2)

## 2018-07-28 LAB — RPR: RPR Ser Ql: REACTIVE — AB

## 2018-07-28 LAB — RPR TITER

## 2018-07-28 LAB — FLUORESCENT TREPONEMAL AB(FTA)-IGG-BLD: FLUORESCENT TREPONEMAL ABS: REACTIVE — AB

## 2018-07-28 LAB — HIV-1 RNA QUANT-NO REFLEX-BLD
HIV 1 RNA Quant: <20 copies/mL
HIV-1 RNA Quant, Log: <1.3 Log copies/mL

## 2018-07-29 ENCOUNTER — Telehealth: Payer: Self-pay

## 2018-07-29 NOTE — Telephone Encounter (Signed)
Patient called back with clinic information from [place]. Patient states he was in care at [facility] [phone]. Called office to obtain RPR titer, however office unable release information without a signed release.  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 Will route message to Dr. Daiva Eves. Lorenso Courier, CMA

## 2018-07-29 NOTE — Telephone Encounter (Addendum)
Patient called office today stating he received a missed call from our office. States that call was disconnected when he answered.  After chart review informed patient informed patient that we need to set up appointment for Syphillis treatment.Unless we are able to obtain RPR titer from MD in [place].  Patient states he will call office with [place] clinic information to obtain titer.  Lorenso Courier, [place]

## 2018-07-29 NOTE — Telephone Encounter (Signed)
He can either get PCN 2.4 MU weekly x 3 weeks or do doxycyline 100mg  for TWENTY EIGHT DAYS. Perry Norton is not enough

## 2018-07-29 NOTE — Telephone Encounter (Signed)
-----   Message from Randall Hiss, MD sent at 07/28/2018  9:04 AM EST ----- I don't think we have ever gotten info from his MD in [place] and there are ZERO RPRS besides 2009 when NR. I think he needs to be treated if we cannot get info from his MD. Have we already given him IM PCN 2.4 MU x 3 shots?

## 2018-08-01 ENCOUNTER — Other Ambulatory Visit: Payer: Self-pay | Admitting: Infectious Disease

## 2018-08-01 ENCOUNTER — Telehealth: Payer: Self-pay

## 2018-08-01 NOTE — Telephone Encounter (Signed)
Lets go ahead and give doxycycline 100 mg BID x 28 days

## 2018-08-01 NOTE — Telephone Encounter (Signed)
error 

## 2018-08-01 NOTE — Telephone Encounter (Signed)
Called patient to inform him that he would need to get retreated for syphillis due to elevated RPR. Patient states he would prefer to take Doxycycline since he will be out of town on 2/ 25 Until 5/5. Patient would like prescription sent to [facility] on [address]. Will route message to Dr. Daiva Eves for prescription.  Lorenso Courier, [place]

## 2018-08-02 ENCOUNTER — Other Ambulatory Visit: Payer: Self-pay

## 2018-08-02 DIAGNOSIS — B20 Human immunodeficiency virus [HIV] disease: Secondary | ICD-10-CM

## 2018-08-02 DIAGNOSIS — A539 Syphilis, unspecified: Secondary | ICD-10-CM

## 2018-08-02 MED ORDER — DOXYCYCLINE HYCLATE 100 MG PO TABS: 100.0000 mg | ORAL_TABLET | Freq: Two times a day (BID) | ORAL | 0 refills | Status: AC

## 2018-08-02 NOTE — Telephone Encounter (Signed)
Prescription was sent electronically to [facility] for patient. Patient was notified.  Lorenso Courier, [place]

## 2018-08-08 ENCOUNTER — Other Ambulatory Visit: Payer: Medicaid Other

## 2018-08-10 ENCOUNTER — Encounter: Payer: Medicaid Other | Admitting: Infectious Diseases

## 2018-08-12 ENCOUNTER — Telehealth: Payer: Self-pay | Admitting: Nurse Practitioner

## 2018-08-12 DIAGNOSIS — I10 Essential (primary) hypertension: Secondary | ICD-10-CM

## 2018-08-12 MED ORDER — LOSARTAN POTASSIUM 50 MG PO TABS: 50.0000 mg | ORAL_TABLET | Freq: Every day | ORAL | 1 refills | Status: DC

## 2018-08-12 NOTE — Telephone Encounter (Signed)
I have refilled his antihypertensives, seizure meds need to come from his neurologist.

## 2018-08-12 NOTE — Telephone Encounter (Signed)
Per [facility] the patient has not picked up Losartan since 09/03/17 when it was initially written. His last two BP readings in office were 100/65 and 107/64, and he would not have had any of his BP meds. Please advise if this should be refilled at this time.  Also please confirm that this office does not treat this patients seizure disorder. He was previously seen by Dr. Karel Jarvis but has been discharged from her care due to "lack of trust"

## 2018-08-12 NOTE — Telephone Encounter (Signed)
CMA attempt to reach patient to inform his BP medication had been fill and will need to contact his Neurologist for his seizure medication fill, no answer and phone was ringing for a while with no option to leave a VM.

## 2018-08-12 NOTE — Telephone Encounter (Signed)
1) Medication(s) Requested (by name): bp medicine and seizure medicine *did not know the names of the medications* 2) Pharmacy of Choice:  [id number] - [place], [place] - [address] AT [facility] OF [address] & [address]

## 2018-08-15 ENCOUNTER — Telehealth: Payer: Self-pay | Admitting: *Deleted

## 2018-08-15 ENCOUNTER — Telehealth: Payer: Self-pay | Admitting: Nurse Practitioner

## 2018-08-15 ENCOUNTER — Other Ambulatory Visit: Payer: Self-pay | Admitting: Nurse Practitioner

## 2018-08-15 MED ORDER — DIVALPROEX SODIUM 500 MG PO DR TAB: DELAYED_RELEASE_TABLET | ORAL | 1 refills | Status: DC

## 2018-08-15 NOTE — Telephone Encounter (Signed)
Did you report this to Perry Norton? This is the kind of behavior that crosses a line and then we will not tolerate in our clinic

## 2018-08-15 NOTE — Telephone Encounter (Signed)
Patient presented to clinic today to speak with the pharmacy staff on his wife's behalf. He is upset because he states they were told his wife's medication was going to be free and now it is going to her insurance and he is upset and visibly shaking. Advised him that if he has insurance we have to bill that but with a copay card it is essentially free. He was not happy with that answer and started to shake more and demanded to see the pharmacist. Advised him she was busy with an appointment at this time but I could make him an appointment to see her at at later date to discuss. He refused the offer stating he was leaving town today and not sure when he would be back. Offered for his wife to come or call to discuss and he stated she works and for me to just send the Rx to the pharmacy.  Tried to give him the pharmacy number to follow up and set up his delivery and he stated "Im getting out of this damn place" He started out the door was even more angry and shaking and stumbled down to one knee. Tried to assist him and asked if he was ok and he would not answer, got back to his feet and left the building quickly.   Returned to building about 30-45 minutes later and wanted contact information offered at last contact Paulino Rily took him the info and he snatched the number and did not stay long enough to get the copay card.   Patient was not pleasant and I did not feel comfortable in the room with him alone. I did not close the door and I asked for a chaperone.

## 2018-08-15 NOTE — Telephone Encounter (Signed)
1) Medication(s) Requested (by name): divalproex (DEPAKOTE) 500 MG DR tablet [[id number]]    2) Pharmacy of Choice: [facility]    3) Special Requests: pleases send today    Approved medications will be sent to the pharmacy, we will reach out if there is an issue.  Requests made after 3pm may not be addressed until the following business day!  If a patient is unsure of the name of the medication(s) please note and ask patient to call back when they are able to provide all info, do not send to responsible party until all information is available!

## 2018-08-23 ENCOUNTER — Encounter: Payer: Medicaid Other | Admitting: Infectious Disease

## 2018-08-29 ENCOUNTER — Telehealth: Payer: Self-pay | Admitting: Nurse Practitioner

## 2018-08-29 ENCOUNTER — Other Ambulatory Visit: Payer: Self-pay | Admitting: Nurse Practitioner

## 2018-08-29 ENCOUNTER — Telehealth: Payer: Self-pay

## 2018-08-29 DIAGNOSIS — G40909 Epilepsy, unspecified, not intractable, without status epilepticus: Secondary | ICD-10-CM

## 2018-08-29 NOTE — Telephone Encounter (Signed)
CMA called back patient.  Patient was inform that PCP cannot keep filling his seizure medication and he will need to see a neurology.  Pt. Stated he do not want to see [facility] and [facility].  Pt. Was agreed to try different location for neurology and understood PCP cannot keep filling his seizure medication.

## 2018-08-29 NOTE — Telephone Encounter (Signed)
Patient contacted [facility] in regards to referral but was told he needs a new referral patient does not want to see [facility] or [facility].   Patient states he is also out of his seizure medication please follow up. Patient states he had one last night and two this morning.

## 2018-08-29 NOTE — Telephone Encounter (Signed)
Patient called office today to inform Dr. Daiva Eves that he has finished his doxycycline. Patient would like to know if he would still need lumbar puncture to test for neurosyphilis. Patient would like to repeat lab work during next visit. Will route message to MD regarding patients questions about lumbar puncture. Perry Norton, [place]

## 2018-08-29 NOTE — Telephone Encounter (Signed)
Will wait til pt has f/u with me or Dr Daiva Eves for conversation.

## 2018-08-29 NOTE — Telephone Encounter (Signed)
Patient says he would like to speak with you regarding his neurology referral. Please follow up

## 2018-08-29 NOTE — Telephone Encounter (Signed)
Please let patient know if he does not follow up with Neurology I will not be able to continue to treat him as his seizures are uncontrolled and need to continue to be evaluated by Neurology

## 2018-08-30 NOTE — Telephone Encounter (Signed)
CMA attempt to reach patient to inform on PCP advising.  No answer and unable to leave a VM due to no mailbox has been set up.

## 2018-09-07 ENCOUNTER — Ambulatory Visit (INDEPENDENT_AMBULATORY_CARE_PROVIDER_SITE_OTHER): Payer: Medicaid Other | Admitting: Infectious Disease

## 2018-09-07 ENCOUNTER — Other Ambulatory Visit: Payer: Self-pay

## 2018-09-07 ENCOUNTER — Encounter: Payer: Self-pay | Admitting: Infectious Disease

## 2018-09-07 ENCOUNTER — Telehealth: Payer: Self-pay | Admitting: *Deleted

## 2018-09-07 VITALS — Ht 72.0 in | Wt 165.0 lb

## 2018-09-07 DIAGNOSIS — R569 Unspecified convulsions: Secondary | ICD-10-CM

## 2018-09-07 DIAGNOSIS — H547 Unspecified visual loss: Secondary | ICD-10-CM

## 2018-09-07 DIAGNOSIS — A539 Syphilis, unspecified: Secondary | ICD-10-CM

## 2018-09-07 DIAGNOSIS — B20 Human immunodeficiency virus [HIV] disease: Secondary | ICD-10-CM

## 2018-09-07 DIAGNOSIS — G40009 Localization-related (focal) (partial) idiopathic epilepsy and epileptic syndromes with seizures of localized onset, not intractable, without status epilepticus: Secondary | ICD-10-CM | POA: Diagnosis not present

## 2018-09-07 HISTORY — DX: Unspecified visual loss: H54.7

## 2018-09-07 NOTE — Progress Notes (Signed)
Subjective:   Complaint he had recent episode of apparently not be able to see and being "completely blind..   Patient ID: Perry Norton, male    DOB: [birth year], 43 y.o.   MRN: [id number]  HPI   This 43 year old African-American man with HIV that is very well controlled on Genvoya previously followed by Dr. Ninetta Lights seen by me in December.  He has a history of syphilis that was treated in [place] though his titers here have been 1-32 and I do not see a titer higher than 1-64 in quick glance of the records from [place] that is available through care everywhere.  We have never been able to get records about titers higher than this from his provider and his titers have consistently been at 1-32 in [place].  Since I last saw him he as mentioned apparently woke up one morning "completely blind" tells me that his wife noted that he had been having seizures shortly before he woke up.  He has not run out of antiepileptic medications on which she is on many.  I was initially concerned about the possibility that his Genvoya could have been boosting some of these antiepileptic drugs but upon review with Cassie with infectious disease pharmacy I found this was not the case.  I also discovered in further conversations with staff the patient is apparently prone to fairly dramatic behaviors including collapsing on the floor and some cases what seems to be feigning various conditions.  He claimed that his blurry vision resolved well enough that he was able to go to the emergency department and while in the ER he was evaluated but then ultimately left before further work-up could be done.  He says his vision has resolved since then.  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  He  again provided the his phone number of his foot prior infectious disease doctor and then the phone number of the prior clinic where he had attended in [place].  Also related to me that he had undergone a lumbar puncture while in [place] and that he had received multiple rounds of intramuscular doses of penicillin for syphilis.  Past Medical History:  Diagnosis Date  . HIV (human immunodeficiency virus infection) (HCC)   . Seizures (HCC)    epilepsy    No past surgical history on file.  Family History  Problem Relation Age of Onset  . Sarcoidosis Mother       Social History   Socioeconomic History  . Marital status: Single    Spouse name: Not on file  . Number of children: Not on file  . Years of education: Not on file  . Highest education level: Not on file  Occupational History  . Not on file  Social Needs  . Financial resource strain: Not on file  . Food insecurity:    Worry: Not on file    Inability: Not on file  . Transportation needs:    Medical: Not on file    Non-medical: Not on file  Tobacco Use  . Smoking status: Current Some Day Smoker    Packs/day: 1.00    Types: Cigarettes    Start date: 06/22/1994  . Smokeless tobacco: Never Used  Substance and Sexual Activity  . Alcohol use: Yes  Alcohol/week: 3.0 standard drinks    Types: 3 Cans of beer per week    Comment: occ  . Drug use: Yes    Types: Marijuana  . Sexual activity: Not on file  Lifestyle  . Physical activity:    Days per week: Not on file    Minutes per session: Not on file  . Stress: Not on file  Relationships  . Social connections:    Talks on phone: Not on file    Gets together: Not on file    Attends religious service: Not on file    Active member of club or organization: Not on file    Attends meetings of clubs or organizations: Not on file    Relationship status: Not on file  Other Topics Concern  . Not on file  Social History Narrative  . Not on file    Allergies  Allergen  Reactions  . Onion Rash     Current Outpatient Medications:  .  bictegravir-emtricitabine-tenofovir AF (BIKTARVY) 50-200-25 MG TABS tablet, Take 1 tablet by mouth daily., Disp: 30 tablet, Rfl: 11 .  divalproex (DEPAKOTE) 500 MG DR tablet, Take 2 tabs in AM, 1/2 tab at noon, 2 tabs in PM, Disp: 135 tablet, Rfl: 1 .  lacosamide (VIMPAT) 200 MG TABS tablet, Take 1 tablet (200 mg total) by mouth 2 (two) times daily., Disp: 60 tablet, Rfl: 5 .  losartan (COZAAR) 50 MG tablet, Take 1 tablet (50 mg total) by mouth daily., Disp: 30 tablet, Rfl: 1 .  sildenafil (VIAGRA) 25 MG tablet, Take 1 tablet (25 mg total) by mouth daily as needed for erectile dysfunction., Disp: 10 tablet, Rfl: 2 .  topiramate (TOPAMAX) 50 MG tablet, Take 1 tablet twice a day, Disp: 120 tablet, Rfl: 6 .  Vitamin D, Ergocalciferol, (DRISDOL) 1.25 MG (50000 UT) CAPS capsule, Take 1 capsule (50,000 Units total) by mouth every 7 (seven) days., Disp: 16 capsule, Rfl: 0    Review of Systems  Constitutional: Negative for activity change, appetite change, chills, diaphoresis, fatigue, fever and unexpected weight change.  HENT: Negative for congestion, rhinorrhea, sinus pressure, sneezing, sore throat and trouble swallowing.   Eyes: Positive for visual disturbance. Negative for photophobia.  Respiratory: Negative for cough, chest tightness, shortness of breath, wheezing and stridor.   Cardiovascular: Negative for chest pain, palpitations and leg swelling.  Gastrointestinal: Negative for abdominal distention, abdominal pain, anal bleeding, blood in stool, constipation, diarrhea, nausea and vomiting.  Genitourinary: Negative for difficulty urinating, dysuria, flank pain and hematuria.  Musculoskeletal: Negative for arthralgias, back pain, gait problem, joint swelling and myalgias.  Skin: Negative for color change, pallor, rash and wound.  Neurological: Positive for seizures. Negative for dizziness, tremors, weakness and light-headedness.   Hematological: Negative for adenopathy. Does not bruise/bleed easily.  Psychiatric/Behavioral: Negative for agitation, behavioral problems, confusion, decreased concentration, dysphoric mood and sleep disturbance.       Objective:   Physical Exam Constitutional:      General: He is not in acute distress.    Appearance: Normal appearance. He is well-developed. He is not ill-appearing or diaphoretic.  HENT:     Head: Normocephalic and atraumatic.     Right Ear: Hearing and external ear normal.     Left Ear: Hearing and external ear normal.     Nose: No nasal deformity or rhinorrhea.  Eyes:     General: No scleral icterus.    Conjunctiva/sclera: Conjunctivae normal.     Right eye: Right conjunctiva is not injected.  Left eye: Left conjunctiva is not injected.     Pupils: Pupils are equal, round, and reactive to light.  Neck:     Musculoskeletal: Normal range of motion and neck supple.     Vascular: No JVD.  Cardiovascular:     Rate and Rhythm: Normal rate.     Heart sounds: S1 normal and S2 normal.  Pulmonary:     Effort: Pulmonary effort is normal. No respiratory distress.  Abdominal:     General: There is no distension.     Palpations: Abdomen is soft.  Musculoskeletal: Normal range of motion.     Right shoulder: Normal.     Left shoulder: Normal.     Right hip: Normal.     Left hip: Normal.     Right knee: Normal.     Left knee: Normal.  Lymphadenopathy:     Head:     Right side of head: No submandibular, preauricular or posterior auricular adenopathy.     Left side of head: No submandibular, preauricular or posterior auricular adenopathy.     Cervical: No cervical adenopathy.     Right cervical: No superficial or deep cervical adenopathy.    Left cervical: No superficial or deep cervical adenopathy.  Skin:    General: Skin is warm and dry.     Coloration: Skin is not pale.     Findings: No abrasion, bruising, ecchymosis, erythema, lesion or rash.     Nails:  There is no clubbing.   Neurological:     Mental Status: He is alert and oriented to person, place, and time.     Sensory: No sensory deficit.     Coordination: Coordination normal.     Gait: Gait normal.  Psychiatric:        Attention and Perception: He is attentive.        Mood and Affect: Mood is anxious.        Speech: Speech normal.        Behavior: Behavior normal. Behavior is cooperative.        Thought Content: Thought content normal.        Judgment: Judgment normal.           Assessment & Plan:   HIV disease: Continue with Biktarvy check Seizure disorder continue current medications if he has had actual seizures he needs to have his antiepileptics titrated  Syphilis: If not possible to clarify what his titers were with initial diagnosis with diagnosed with syphilis 10 years ago previously, I would favor giving him a 2-week course of intravenous penicillin resumptive treatment of neurosyphilis  Transient Linus: Not clear what was going on if this did represent a seizure.  History of feigning various injuries: I was unfamiliar with this but heard this from staff after seeing the patient.  I spent greater than 25 minutes with the patient including greater than 50% of time in face to face counsel of the patient and his labs his adherence to medications my concerned about his high RPR titer and in coordination of his care.

## 2018-09-07 NOTE — Progress Notes (Signed)
Patient refused depression screening. Patient refused vitals LPN called previous ID with Dr. Doristine Church at [phone] received an automated message stating this office has now closed and Dr. Doristine Church is now with [facility] [phone]. Spoke to front desk at [place] location and this office did not have information on patient. LPN called back to original office of Dr. Doristine Church and had to leave a message that states this office will "check messages daily". Left message with [facility] contact information with triage phone line and fax phone with patient's name and DOB. Per Dr. Daiva Eves he would like patients initial RPR titer and history of treatment.  Valarie Cones, LPN

## 2018-09-08 NOTE — Telephone Encounter (Signed)
Patient's previous ID provider called with requested information regarding his syphilis history. Per Dr Doristine Church, patient had titer of 1:256, received 2 rounds of bicillin 2.4 million units 3 injections each, as well as several rounds of month-long doxycycline. His wife was treated as well. His serologies remained 1:64, 1:32 for several years.    Patient never consented for lumbar puncture, was never treated with IV penicillin.  If you need to speak with Dr Doristine Church, please text him at [phone] and he will call back (he is in clinic hours). Andree Coss, RN

## 2018-09-20 ENCOUNTER — Ambulatory Visit: Payer: Medicaid Other | Admitting: Nurse Practitioner

## 2018-09-22 NOTE — Telephone Encounter (Signed)
Lets just follow him clinically for now then thanks [facility]

## 2018-09-23 ENCOUNTER — Ambulatory Visit: Payer: Medicaid Other | Admitting: Nurse Practitioner

## 2018-09-23 ENCOUNTER — Other Ambulatory Visit: Payer: Self-pay

## 2018-09-23 ENCOUNTER — Encounter: Payer: Self-pay | Admitting: Nurse Practitioner

## 2018-09-26 ENCOUNTER — Encounter: Payer: Self-pay | Admitting: Nurse Practitioner

## 2018-09-26 NOTE — Progress Notes (Signed)
This encounter was created in error - please disregard.  This encounter was created in error - please disregard.

## 2018-10-03 ENCOUNTER — Telehealth: Payer: Self-pay | Admitting: Behavioral Health

## 2018-10-03 NOTE — Telephone Encounter (Signed)
Called patient to inform him that Dr. Daiva Eves would like to hold off on treatment for now after information was provided by his MD is [place].  Patient was disrespectful to RN, yelling in the phone stating he wants his number to come all the was down.  RN asked patient to lower his voice and he hung up the phone. Angeline Slim RN

## 2018-10-03 NOTE — Telephone Encounter (Signed)
I am going to hold off on retreating his syphilis based on the information his MD in [place] provided.

## 2018-10-03 NOTE — Telephone Encounter (Signed)
Patient called angry, stating he wants to know what Dr. Daiva Eves plans to do for his Syphilis treatment.  Patient states he was told there was a possibility he would need 2 weeks of IV PCN.  Patient would not let nurse explain to him that Dr. Daiva Eves would be sent a message and would follow up with him once there is an answer.  Patient hung up on RN after stating he knows he will not get an answer back and he is tired of calling. Angeline Slim RN

## 2018-10-04 NOTE — Telephone Encounter (Signed)
I would consider dismissing him from the clinic. I would bring up with Perry Norton but I and WE WILL NOT TOLERATE this type of behavior

## 2018-10-05 ENCOUNTER — Ambulatory Visit: Payer: Medicaid Other | Admitting: Nurse Practitioner

## 2018-11-09 IMAGING — CR DG SHOULDER 2+V*L*
3 series · 3 of 3 positions shown · non-contrast
Comparison: None.

CLINICAL DATA: Seizure, left shoulder pain

EXAM:
LEFT SHOULDER - 2+ VIEW

[shoulder grashey]
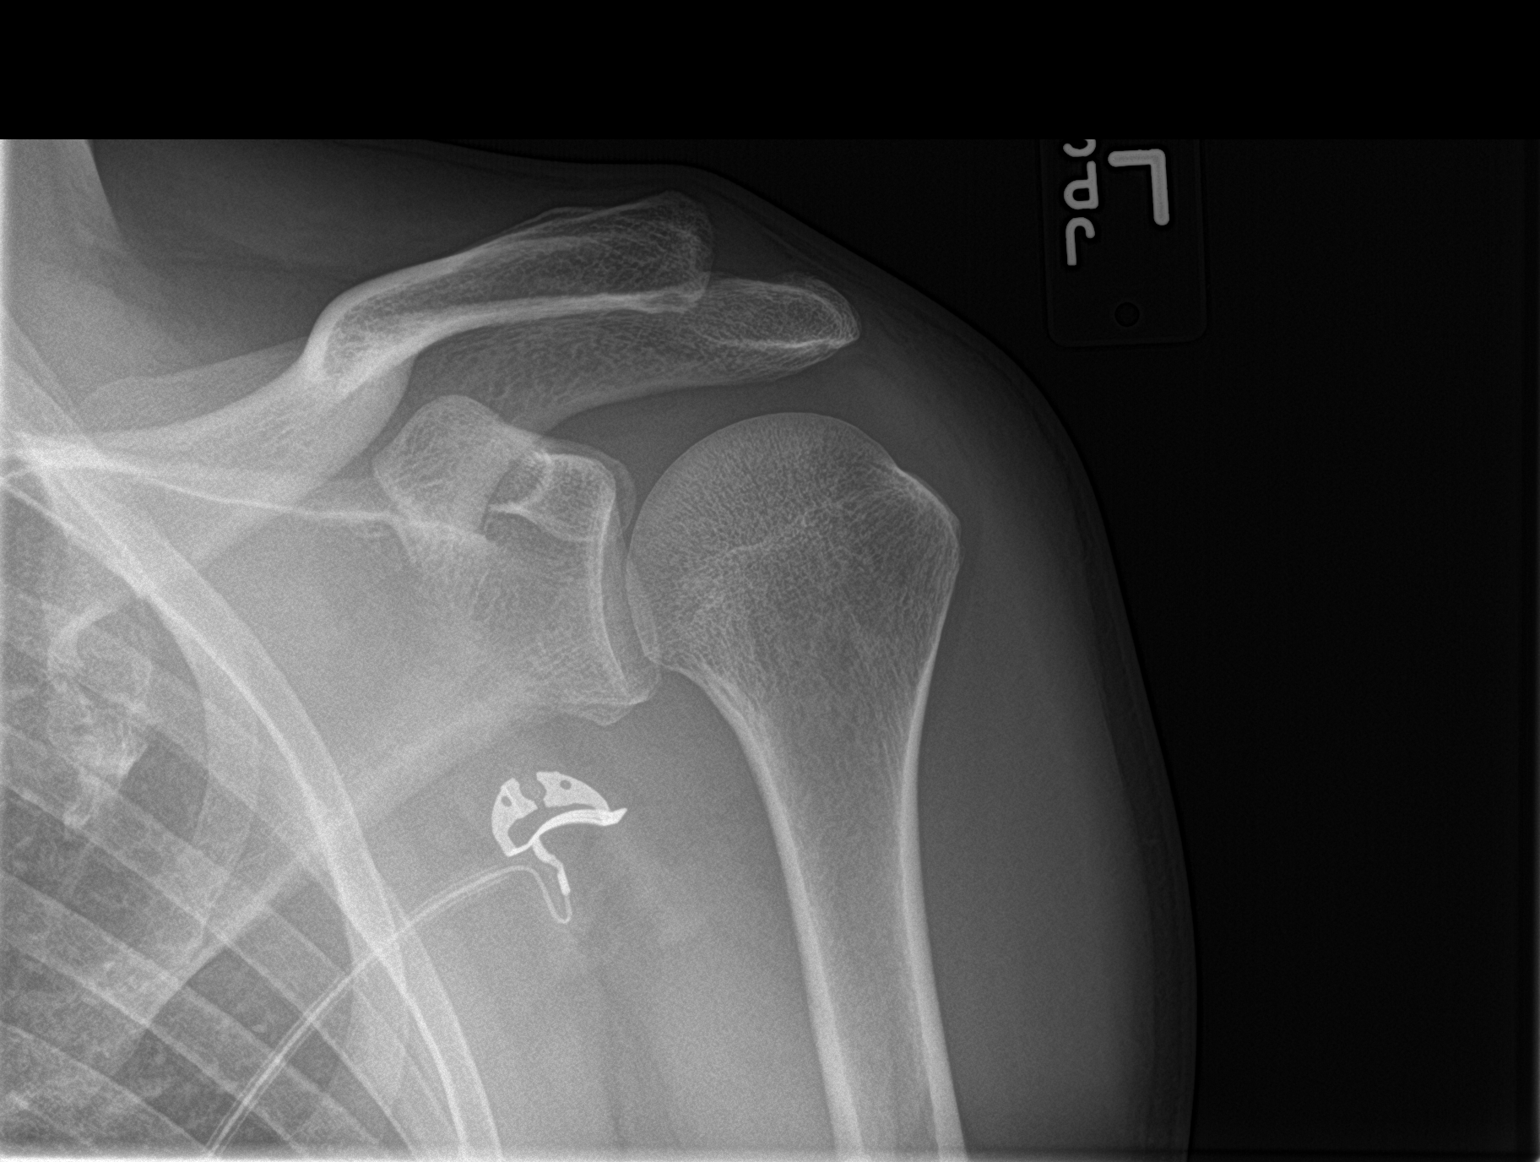

[shoulder y view]
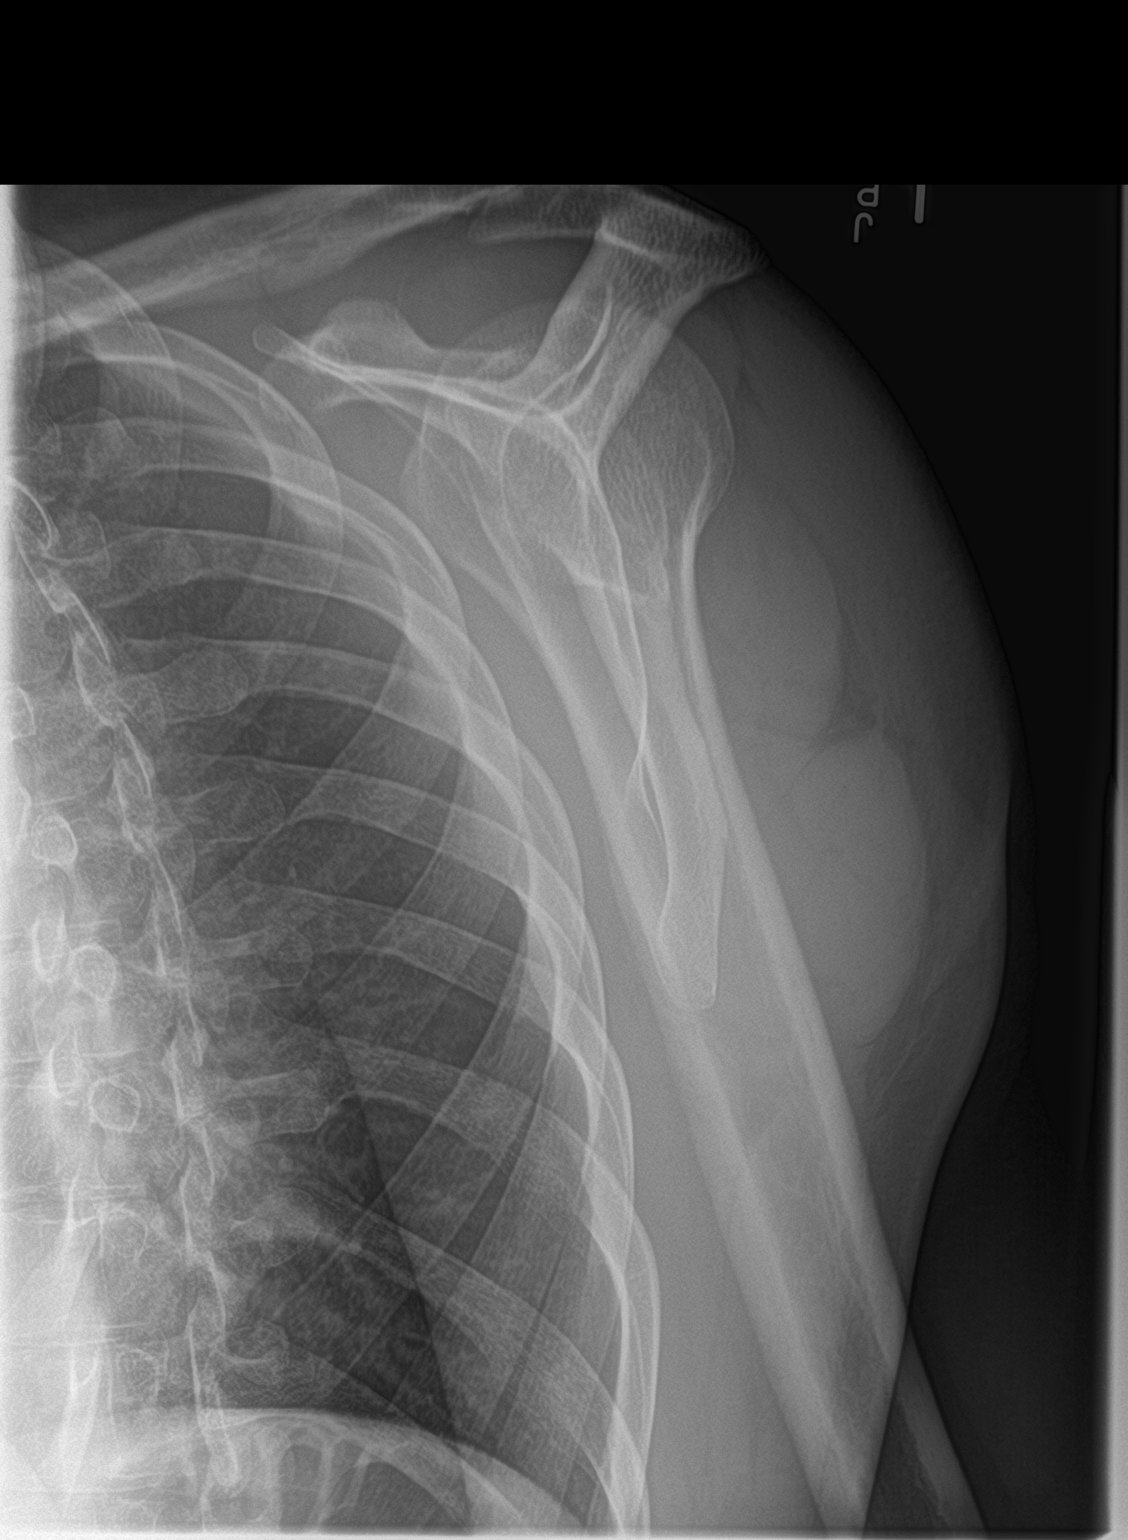

[shoulder axillary]
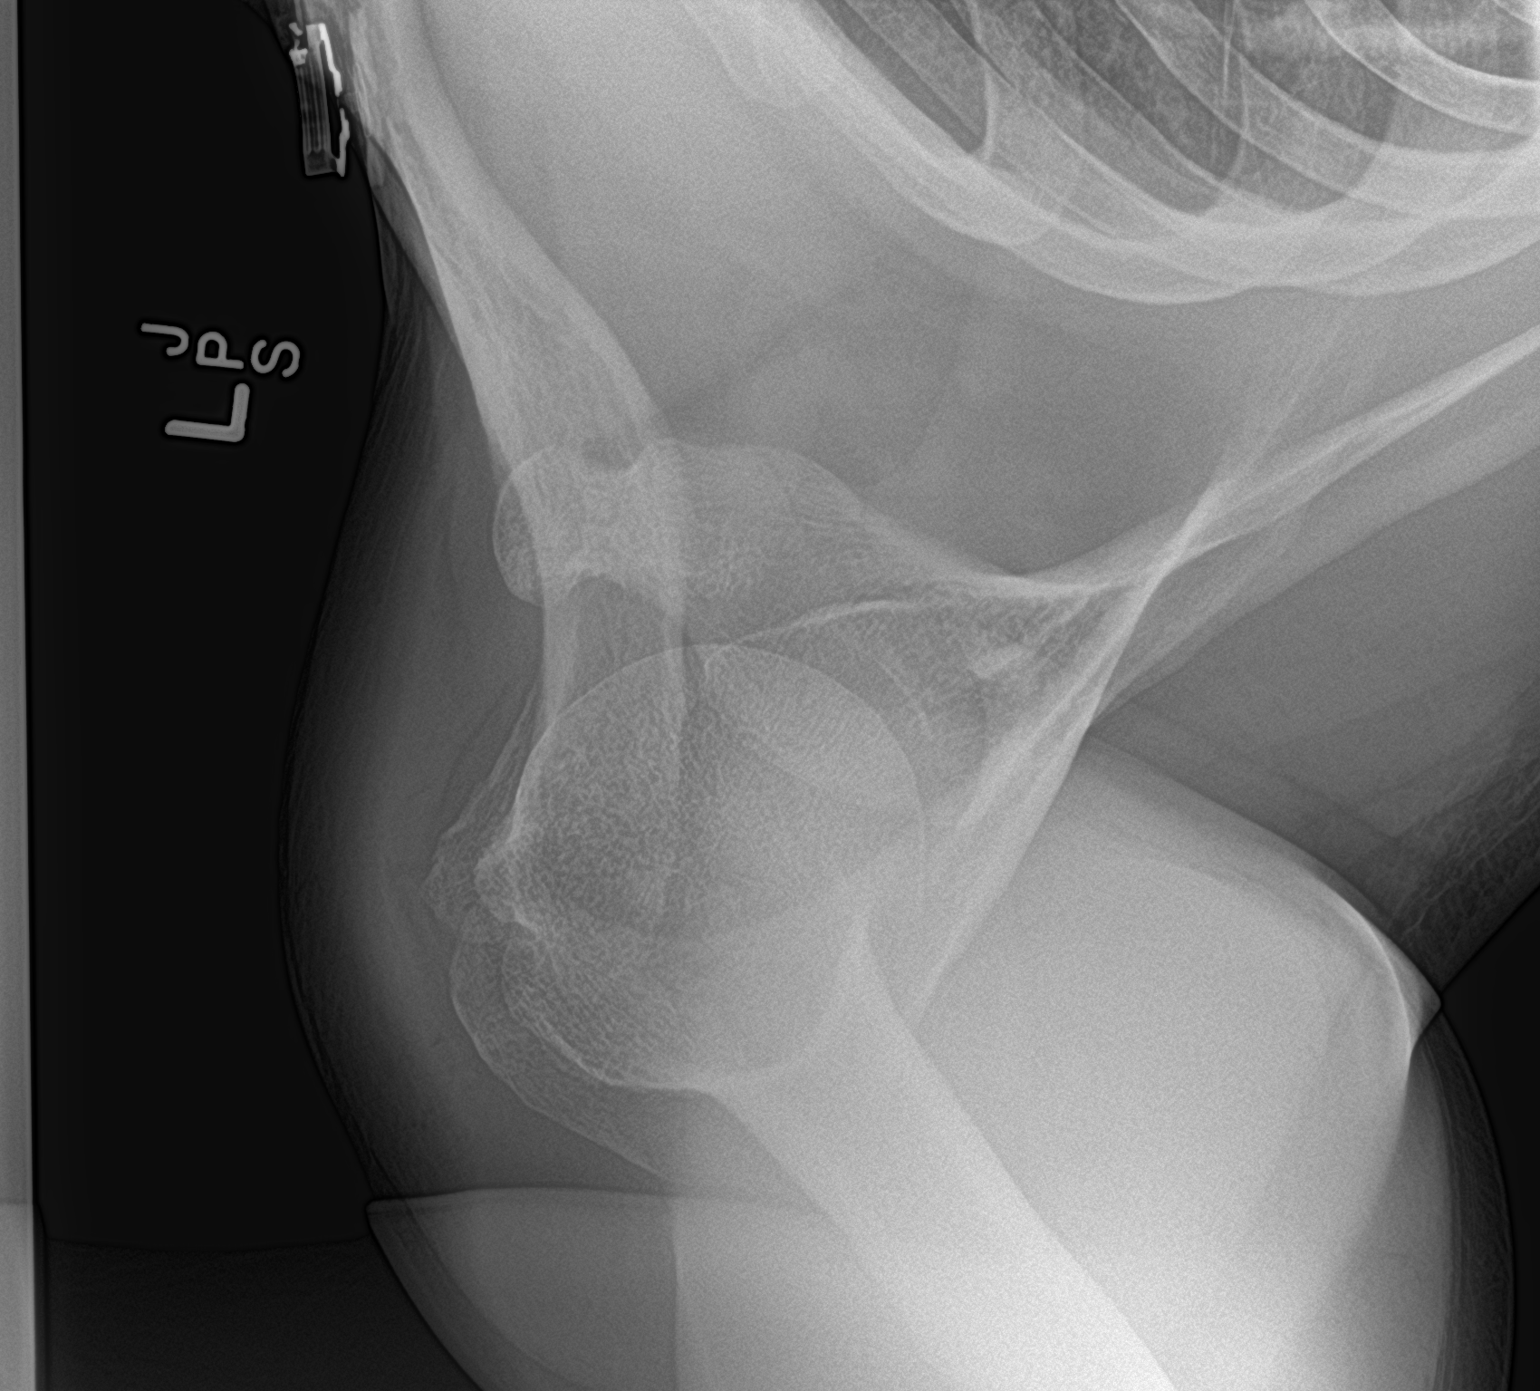

[3 of 3 positions shown; findings below may reference images not displayed]

FINDINGS: No acute bony abnormality. Specifically, no fracture, subluxation,
or dislocation. Joint spaces maintained.
IMPRESSION: Negative.

## 2018-11-15 DIAGNOSIS — G43009 Migraine without aura, not intractable, without status migrainosus: Secondary | ICD-10-CM | POA: Diagnosis not present

## 2018-11-15 DIAGNOSIS — G40909 Epilepsy, unspecified, not intractable, without status epilepticus: Secondary | ICD-10-CM | POA: Diagnosis not present

## 2018-11-15 DIAGNOSIS — G40309 Generalized idiopathic epilepsy and epileptic syndromes, not intractable, without status epilepticus: Secondary | ICD-10-CM | POA: Insufficient documentation

## 2018-11-15 HISTORY — DX: Generalized idiopathic epilepsy and epileptic syndromes, not intractable, without status epilepticus: G40.309

## 2018-11-23 ENCOUNTER — Other Ambulatory Visit: Payer: Self-pay | Admitting: Nurse Practitioner

## 2018-11-23 ENCOUNTER — Telehealth: Payer: Self-pay | Admitting: Nurse Practitioner

## 2018-11-23 DIAGNOSIS — M79671 Pain in right foot: Secondary | ICD-10-CM

## 2018-11-23 DIAGNOSIS — M79672 Pain in left foot: Secondary | ICD-10-CM

## 2018-11-23 NOTE — Telephone Encounter (Signed)
New Message   I called pt to verify his televist for 11/25/2018 at 11:10 but pt refused, states it is some bs and that we are playing games. Also states this keeps happening and he does not want to do tele visits

## 2018-11-23 NOTE — Telephone Encounter (Signed)
PCP will refer patient to Podiatry for his sharp shooting pain on both of his foot.

## 2018-11-23 NOTE — Telephone Encounter (Signed)
Patient called because they would like to get a referral placed. Please follow up.

## 2018-11-25 ENCOUNTER — Ambulatory Visit: Payer: Medicaid Other | Admitting: Nurse Practitioner

## 2018-11-28 DIAGNOSIS — G40309 Generalized idiopathic epilepsy and epileptic syndromes, not intractable, without status epilepticus: Secondary | ICD-10-CM | POA: Diagnosis not present

## 2018-11-30 DIAGNOSIS — F4310 Post-traumatic stress disorder, unspecified: Secondary | ICD-10-CM | POA: Diagnosis not present

## 2018-12-06 ENCOUNTER — Encounter: Payer: Self-pay | Admitting: Podiatry

## 2018-12-06 ENCOUNTER — Other Ambulatory Visit: Payer: Self-pay

## 2018-12-06 ENCOUNTER — Other Ambulatory Visit: Payer: Self-pay | Admitting: Podiatry

## 2018-12-06 ENCOUNTER — Ambulatory Visit: Payer: Medicaid Other | Admitting: Podiatry

## 2018-12-06 ENCOUNTER — Ambulatory Visit (INDEPENDENT_AMBULATORY_CARE_PROVIDER_SITE_OTHER): Payer: Medicaid Other

## 2018-12-06 DIAGNOSIS — M779 Enthesopathy, unspecified: Secondary | ICD-10-CM | POA: Diagnosis not present

## 2018-12-06 DIAGNOSIS — M722 Plantar fascial fibromatosis: Secondary | ICD-10-CM

## 2018-12-06 MED ORDER — DICLOFENAC SODIUM 1 % TD GEL: 2.0000 g | Freq: Four times a day (QID) | TRANSDERMAL | 2 refills | Status: DC

## 2018-12-06 NOTE — Patient Instructions (Signed)

## 2018-12-12 NOTE — Progress Notes (Signed)
Subjective:   Patient ID: Perry Norton, male   DOB: 43 y.o.   MRN: [id number]   HPI 43 year old male presents the office today for concerns of bilateral foot pain.  He states that this is been ongoing for the last 2 months and denies any recent injury or trauma.  He has no other chronic.  He states he has sharp pain and he points to the arch of his foot where the pain starts he states it goes to the top of the foot where it "feels like it is coming out".  Denies any swelling.  No change in activity level.  Pain is in the same area on both feet.   Review of Systems  All other systems reviewed and are negative.  Past Medical History:  Diagnosis Date  . HIV (human immunodeficiency virus infection) ([place])   . Seizures ([place])    epilepsy  . Visual loss 09/07/2018    History reviewed. No pertinent surgical history.   Current Outpatient Medications:  .  bictegravir-emtricitabine-tenofovir AF (BIKTARVY) 50-200-25 MG TABS tablet, Take 1 tablet by mouth daily., Disp: 30 tablet, Rfl: 11 .  diclofenac sodium (VOLTAREN) 1 % GEL, Apply 2 g topically 4 (four) times daily. Rub into affected area of foot 2 to 4 times daily, Disp: 100 g, Rfl: 2 .  divalproex (DEPAKOTE) 500 MG DR tablet, Take 2 tabs in AM, 1/2 tab at noon, 2 tabs in PM, Disp: 135 tablet, Rfl: 1 .  lacosamide (VIMPAT) 200 MG TABS tablet, Take 1 tablet (200 mg total) by mouth 2 (two) times daily., Disp: 60 tablet, Rfl: 5 .  losartan (COZAAR) 50 MG tablet, Take 1 tablet (50 mg total) by mouth daily., Disp: 30 tablet, Rfl: 1 .  sildenafil (VIAGRA) 25 MG tablet, Take 1 tablet (25 mg total) by mouth daily as needed for erectile dysfunction., Disp: 10 tablet, Rfl: 2 .  topiramate (TOPAMAX) 50 MG tablet, Take 1 tablet twice a day, Disp: 120 tablet, Rfl: 6 .  Vitamin D, Ergocalciferol, (DRISDOL) 1.25 MG (50000 UT) CAPS capsule, Take 1 capsule (50,000 Units total) by mouth every 7 (seven) days., Disp: 16 capsule, Rfl: 0  Allergies  Allergen Reactions   . Onion Rash         Objective:  Physical Exam  General: AAO x3, NAD  Dermatological: Skin is warm, dry and supple bilateral. Nails x 10 are well manicured; remaining integument appears unremarkable at this time. There are no open sores, no preulcerative lesions, no rash or signs of infection present.  Vascular: Dorsalis Pedis artery and Posterior Tibial artery pedal pulses are 2/4 bilateral with immedate capillary fill time. Pedal hair growth present. No varicosities and no lower extremity edema present bilateral. There is no pain with calf compression, swelling, warmth, erythema.   Neruologic: Grossly intact via light touch bilateral. Vibratory intact via tuning fork bilateral. Protective threshold with Semmes Wienstein monofilament intact to all pedal sites bilateral.  Negative Tinel sign.  Musculoskeletal: It appears that the tenderness is starting on the medial band plantar fascia in the arch of the foot.  Mild discomfort in the course of the dorsal aspect of the Lisfranc joint.  There is no specific area pinpoint tenderness.  Ankle, subtalar joint range of motion.  Mild decrease in medial arch height with the left side.  Flexor, extensor tendons intact.  Achilles tendon intact.  Muscular strength 5/5 in all groups tested bilateral.  Gait: Unassisted, Nonantalgic.       Assessment:   Bilateral  foot pain, plantar fasciitis/capsulitis     Plan:  -Treatment options discussed including all alternatives, risks, and complications -Etiology of symptoms were discussed -X-rays were obtained and reviewed with the patient. There is no evidence of acute fracture or stress reaction.  Calcaneal pitch is higher on the right than the left. -We discussed shoe modification orthotics.  Power steps were dispensed today. -Start Voltaren gel -Discussed general stretching exercises daily.  Ice daily.  Vivi BarrackMatthew R Wagoner DPM

## 2018-12-13 DIAGNOSIS — G43009 Migraine without aura, not intractable, without status migrainosus: Secondary | ICD-10-CM | POA: Diagnosis not present

## 2018-12-13 DIAGNOSIS — G40309 Generalized idiopathic epilepsy and epileptic syndromes, not intractable, without status epilepticus: Secondary | ICD-10-CM | POA: Diagnosis not present

## 2018-12-19 DIAGNOSIS — F4310 Post-traumatic stress disorder, unspecified: Secondary | ICD-10-CM | POA: Diagnosis not present

## 2018-12-26 ENCOUNTER — Other Ambulatory Visit: Payer: Medicaid Other

## 2018-12-26 ENCOUNTER — Other Ambulatory Visit: Payer: Self-pay

## 2018-12-26 DIAGNOSIS — F4310 Post-traumatic stress disorder, unspecified: Secondary | ICD-10-CM | POA: Diagnosis not present

## 2018-12-26 DIAGNOSIS — B20 Human immunodeficiency virus [HIV] disease: Secondary | ICD-10-CM | POA: Diagnosis not present

## 2018-12-27 LAB — T-HELPER CELL (CD4) - (RCID CLINIC ONLY)
CD4 % Helper T Cell: 32 % — ABNORMAL LOW (ref 33–65)
CD4 T Cell Abs: 648 /uL (ref 400–1790)

## 2019-01-02 ENCOUNTER — Ambulatory Visit: Payer: Medicaid Other | Admitting: Podiatry

## 2019-01-02 ENCOUNTER — Other Ambulatory Visit: Payer: Self-pay

## 2019-01-02 VITALS — Temp 97.8°F

## 2019-01-02 DIAGNOSIS — M722 Plantar fascial fibromatosis: Secondary | ICD-10-CM | POA: Diagnosis not present

## 2019-01-02 DIAGNOSIS — G629 Polyneuropathy, unspecified: Secondary | ICD-10-CM

## 2019-01-02 MED ORDER — DICLOFENAC SODIUM 1 % TD GEL: 2.0000 g | Freq: Four times a day (QID) | TRANSDERMAL | 2 refills | Status: DC

## 2019-01-03 ENCOUNTER — Telehealth: Payer: Self-pay | Admitting: *Deleted

## 2019-01-03 DIAGNOSIS — M779 Enthesopathy, unspecified: Secondary | ICD-10-CM

## 2019-01-03 DIAGNOSIS — G629 Polyneuropathy, unspecified: Secondary | ICD-10-CM

## 2019-01-03 DIAGNOSIS — M722 Plantar fascial fibromatosis: Secondary | ICD-10-CM

## 2019-01-03 LAB — COMPLETE METABOLIC PANEL WITH GFR
AG Ratio: 1.9 (calc) (ref 1.0–2.5)
ALT: 11 U/L (ref 9–46)
AST: 14 U/L (ref 10–40)
Albumin: 4.4 g/dL (ref 3.6–5.1)
Alkaline phosphatase (APISO): 61 U/L (ref 36–130)
BUN/Creatinine Ratio: 10 (calc) (ref 6–22)
BUN: 14 mg/dL (ref 7–25)
CO2: 31 mmol/L (ref 20–32)
Calcium: 9.7 mg/dL (ref 8.6–10.3)
Chloride: 102 mmol/L (ref 98–110)
Creat: 1.44 mg/dL — ABNORMAL HIGH (ref 0.60–1.35)
GFR, Est African American: 69 mL/min/{1.73_m2} (ref >=60)
GFR, Est Non African American: 60 mL/min/{1.73_m2} (ref >=60)
Globulin: 2.3 g/dL (calc) (ref 1.9–3.7)
Glucose, Bld: 102 mg/dL — ABNORMAL HIGH (ref 65–99)
Potassium: 3.1 mmol/L — ABNORMAL LOW (ref 3.5–5.3)
Sodium: 141 mmol/L (ref 135–146)
Total Bilirubin: 0.3 mg/dL (ref 0.2–1.2)
Total Protein: 6.7 g/dL (ref 6.1–8.1)

## 2019-01-03 LAB — CBC WITH DIFFERENTIAL/PLATELET
Absolute Monocytes: 522 cells/uL (ref 200–950)
Basophils Absolute: 41 cells/uL (ref 0–200)
Basophils Relative: 0.7 %
Eosinophils Absolute: 93 cells/uL (ref 15–500)
Eosinophils Relative: 1.6 %
HCT: 41.1 % (ref 38.5–50.0)
Hemoglobin: 13.9 g/dL (ref 13.2–17.1)
Lymphs Abs: 2163 cells/uL (ref 850–3900)
MCH: 31 pg (ref 27.0–33.0)
MCHC: 33.8 g/dL (ref 32.0–36.0)
MCV: 91.5 fL (ref 80.0–100.0)
MPV: 11.3 fL (ref 7.5–12.5)
Monocytes Relative: 9 %
Neutro Abs: 2981 cells/uL (ref 1500–7800)
Neutrophils Relative %: 51.4 %
Platelets: 197 10*3/uL (ref 140–400)
RBC: 4.49 10*6/uL (ref 4.20–5.80)
RDW: 13.4 % (ref 11.0–15.0)
Total Lymphocyte: 37.3 %
WBC: 5.8 10*3/uL (ref 3.8–10.8)

## 2019-01-03 LAB — LIPID PANEL
Cholesterol: 180 mg/dL (ref <200)
HDL: 46 mg/dL (ref >=40)
LDL Cholesterol (Calc): 100 mg/dL (calc) — ABNORMAL HIGH
Non-HDL Cholesterol (Calc): 134 mg/dL (calc) — ABNORMAL HIGH (ref <130)
Total CHOL/HDL Ratio: 3.9 (calc) (ref <5.0)
Triglycerides: 226 mg/dL — ABNORMAL HIGH (ref <150)

## 2019-01-03 LAB — RPR TITER: RPR Titer: 1:16 {titer} — ABNORMAL HIGH

## 2019-01-03 LAB — RPR: RPR Ser Ql: REACTIVE — AB

## 2019-01-03 LAB — HIV-1 RNA QUANT-NO REFLEX-BLD
HIV 1 RNA Quant: 48 copies/mL — ABNORMAL HIGH
HIV-1 RNA Quant, Log: 1.68 Log copies/mL — ABNORMAL HIGH

## 2019-01-03 LAB — FLUORESCENT TREPONEMAL AB(FTA)-IGG-BLD: Fluorescent Treponemal ABS: REACTIVE — AB

## 2019-01-03 NOTE — Telephone Encounter (Signed)
[facility] - Beth states pt is an adult Medicaid pt and they are not able to see. Faxed orders for PT to [facility].

## 2019-01-05 ENCOUNTER — Ambulatory Visit: Payer: Medicaid Other | Admitting: Podiatry

## 2019-01-09 ENCOUNTER — Ambulatory Visit (INDEPENDENT_AMBULATORY_CARE_PROVIDER_SITE_OTHER): Payer: Medicaid Other | Admitting: Infectious Disease

## 2019-01-09 ENCOUNTER — Encounter: Payer: Self-pay | Admitting: Infectious Disease

## 2019-01-09 ENCOUNTER — Other Ambulatory Visit: Payer: Self-pay

## 2019-01-09 VITALS — BP 162/116 | HR 97 | Temp 98.3°F | Wt 161.0 lb

## 2019-01-09 DIAGNOSIS — A539 Syphilis, unspecified: Secondary | ICD-10-CM

## 2019-01-09 DIAGNOSIS — R569 Unspecified convulsions: Secondary | ICD-10-CM | POA: Diagnosis not present

## 2019-01-09 DIAGNOSIS — B20 Human immunodeficiency virus [HIV] disease: Secondary | ICD-10-CM

## 2019-01-09 NOTE — Progress Notes (Signed)
Subjective: 43 year old male presents the office today for follow-up evaluation of bilateral foot pain.  States he has not been doing the stretching or using the Voltaren gel.  He continues to the orthotics which she has not been helpful.  Is difficult to get a history from him however after discussion that she has noticed an ongoing issue for some time he has [place] doctors [place].  Upon discussion with him he states he is more nerve related he is having sharp pains to his feet.  He had previously seen neurology for this and he was started on multiple medications which were not helpful.  He also states he has had nerve conduction test.  He states it was abnormal but does not remember the results.  He does not remember where this was done. Denies any systemic complaints such as fevers, chills, nausea, vomiting. No acute changes since last appointment, and no other complaints at this time.   Objective: AAO x3, NAD There is no area pinpoint tenderness identified today.  There is still some tenderness on the medial band plantar fashion the arch of the foot and mildly dorsal aspect of foot.  The majority symptoms describing today is more sharp, electric type pain.  Negative Tinel sign. No open lesions or pre-ulcerative lesions.  No pain with calf compression, swelling, warmth, erythema  Assessment: Bilateral foot pain, tendinitis with some component of neuritis  Plan: -All treatment options discussed with the patient including all alternatives, risks, complications.  -I discussed other treatment options include medications.  Mother discussed medications he states that he has tried them all with no improvement the only thing that helped was Vicodin.  I will order physical therapy to include general physical therapy as well as [facility].  Prescription was written today for [facility]. -Patient encouraged to call the office with any questions, concerns, change in symptoms.    Trula Slade DPM

## 2019-01-09 NOTE — Progress Notes (Signed)
Subjective:   Chief complaint: Headaches that go down the right side of his head that have persisted    Patient ID: Perry Norton, male    DOB: 06-Oct-1975, 43 y.o.   MRN: [id number]  HPI  This 43 year old African-American man with HIV that is very well controlled on Genvoya previously followed by Dr. Ninetta LightsHatcher seen by me in December.  He has a history of syphilis that was [place] in [place] though his titers here have been 1-32 and I do not see a titer higher than 1-64 in quick glance of the records from [place] that is available through care everywhere.  I actually was able to speak with his MD in [place] who relayed that Minerva Areolaric never would consent to LP or workup for elevated RPR  The patient himself tells me that he underwent 2 lumbar punctures to look for neurosyphilis though I do not have documentation of these.  Regardless we have given him treatment courses here and now his titer is down to 1-16.  I switched him from [place]voya to [place]vy he was distressed that his viral load was 48 though I am sure this is just a blip he wanted me to have the labs repeated.        Past Medical History:  Diagnosis Date  . HIV (human immunodeficiency virus infection) (HCC)   . Seizures (HCC)    epilepsy  . Visual loss 09/07/2018    No past surgical history on file.  Family History  Problem Relation Age of Onset  . Sarcoidosis Mother       Social History   Socioeconomic History  . Marital status: Single    Spouse name: Not on file  . Number of children: Not on file  . Years of education: Not on file  . Highest education level: Not on file  Occupational History  . Not on file  Social Needs  . Financial resource strain: Not on file  . Food insecurity    Worry: Not on file    Inability: Not on file  . Transportation needs    Medical: Not on file    Non-medical: Not on file  Tobacco Use  . Smoking status: Current Some Day Smoker    Packs/day: 1.00    Types: Cigarettes    Start date: 06/22/1994  . Smokeless tobacco: Never Used  Substance and Sexual Activity  . Alcohol use: Yes    Alcohol/week: 3.0 standard drinks    Types: 3 Cans of beer per week    Comment: occ  . Drug use: Yes    Types: Marijuana  . Sexual activity: Not on file  Lifestyle  . Physical activity    Days per week: Not on file    Minutes per session: Not on file  . Stress: Not on file  Relationships  . Social Musicianconnections    Talks on phone: Not on file    Gets together: Not on file    Attends religious service: Not on file    Active member of club or organization: Not on file    Attends meetings of clubs or organizations: Not on file    Relationship status: Not on file  Other Topics Concern  . Not on file  Social History Narrative  . Not on file    Allergies  Allergen Reactions  . Onion Rash     Current Outpatient Medications:  .  bictegravir-emtricitabine-tenofovir AF (BIKTARVY) 50-200-25 MG TABS tablet, Take 1 tablet by mouth daily.,  Disp: 30 tablet, Rfl: 11 .  diclofenac sodium (VOLTAREN) 1 % GEL, Apply 2 g topically 4 (four) times daily. Rub into affected area of foot 2 to 4 times daily, Disp: 100 g, Rfl: 2 .  divalproex (DEPAKOTE) 500 MG DR tablet, Take 2 tabs in AM, 1/2 tab at noon, 2 tabs in PM, Disp: 135 tablet, Rfl: 1 .  elvitegravir-cobicistat-emtricitabine-tenofovir (GENVOYA) 150-150-200-10 MG TABS tablet, TK 1 T PO D WITH BRE, Disp: , Rfl:  .  lacosamide (VIMPAT) 200 MG TABS tablet, Take 1 tablet (200 mg total) by mouth 2 (two) times daily., Disp: 60 tablet, Rfl: 5 .  losartan (COZAAR) 50 MG tablet, Take 1 tablet (50 mg total) by mouth daily., Disp: 30 tablet, Rfl: 1 .  sildenafil (VIAGRA) 25 MG tablet, Take 1 tablet (25 mg total) by mouth daily as needed for erectile dysfunction., Disp: 10 tablet, Rfl: 2 .  topiramate (TOPAMAX) 50 MG tablet, Take 1 tablet twice a day, Disp: 120 tablet, Rfl: 6 .  Vitamin D, Ergocalciferol, (DRISDOL) 1.25 MG (50000 UT) CAPS capsule,  Take 1 capsule (50,000 Units total) by mouth every 7 (seven) days., Disp: 16 capsule, Rfl: 0    Review of Systems  Constitutional: Negative for activity change, appetite change, chills, diaphoresis, fatigue, fever and unexpected weight change.  HENT: Negative for congestion, rhinorrhea, sinus pressure, sneezing, sore throat and trouble swallowing.   Eyes: Negative for photophobia.  Respiratory: Negative for cough, chest tightness, shortness of breath, wheezing and stridor.   Cardiovascular: Negative for chest pain, palpitations and leg swelling.  Gastrointestinal: Negative for abdominal distention, abdominal pain, anal bleeding, blood in stool, constipation, diarrhea, nausea and vomiting.  Genitourinary: Negative for difficulty urinating, dysuria, flank pain and hematuria.  Musculoskeletal: Negative for arthralgias, back pain, gait problem, joint swelling and myalgias.  Skin: Negative for color change, pallor, rash and wound.  Neurological: Positive for headaches. Negative for dizziness, tremors, weakness and light-headedness.  Hematological: Negative for adenopathy. Does not bruise/bleed easily.  Psychiatric/Behavioral: Negative for agitation, behavioral problems, confusion, decreased concentration, dysphoric mood and sleep disturbance.       Objective:   Physical Exam Constitutional:      General: He is not in acute distress.    Appearance: Normal appearance. He is well-developed. He is not ill-appearing or diaphoretic.  HENT:     Head: Normocephalic and atraumatic.     Right Ear: Hearing and external ear normal.     Left Ear: Hearing and external ear normal.     Nose: No nasal deformity or rhinorrhea.  Eyes:     General: No scleral icterus.    Conjunctiva/sclera: Conjunctivae normal.     Right eye: Right conjunctiva is not injected.     Left eye: Left conjunctiva is not injected.     Pupils: Pupils are equal, round, and reactive to light.  Neck:     Musculoskeletal: Normal  range of motion and neck supple.     Vascular: No JVD.  Cardiovascular:     Rate and Rhythm: Normal rate.     Heart sounds: S1 normal and S2 normal.  Pulmonary:     Effort: Pulmonary effort is normal. No respiratory distress.  Abdominal:     General: There is no distension.     Palpations: Abdomen is soft.  Musculoskeletal: Normal range of motion.     Right shoulder: Normal.     Left shoulder: Normal.     Right hip: Normal.     Left hip: Normal.  Right knee: Normal.     Left knee: Normal.  Lymphadenopathy:     Head:     Right side of head: No submandibular, preauricular or posterior auricular adenopathy.     Left side of head: No submandibular, preauricular or posterior auricular adenopathy.     Cervical: No cervical adenopathy.     Right cervical: No superficial or deep cervical adenopathy.    Left cervical: No superficial or deep cervical adenopathy.  Skin:    General: Skin is warm and dry.     Coloration: Skin is not pale.     Findings: No abrasion, bruising, ecchymosis, erythema, lesion or rash.     Nails: There is no clubbing.   Neurological:     Mental Status: He is alert and oriented to person, place, and time.     Sensory: No sensory deficit.     Coordination: Coordination normal.     Gait: Gait normal.  Psychiatric:        Attention and Perception: He is attentive.        Mood and Affect: Mood is anxious.        Speech: Speech normal.        Behavior: Behavior normal. Behavior is cooperative.        Thought Content: Thought content normal.        Judgment: Judgment normal.           Assessment & Plan:   HIV disease: Continue with Biktarvy we will check labs tomorrow but I doubt that his viral load of 48 is of any significance  Seizure disorder continue current medications   Syphilis: Since the titer came to 116 I am not I push for lumbar puncture at this point in time  Headaches: Can follow-up with his neurologist with regards to optimization of  management this.      History of feigning various injuries: I was unfamiliar with this but heard this from staff after seeing the patient.

## 2019-01-11 ENCOUNTER — Ambulatory Visit: Payer: Medicaid Other | Admitting: Nurse Practitioner

## 2019-01-13 ENCOUNTER — Ambulatory Visit: Payer: Medicaid Other | Admitting: Nurse Practitioner

## 2019-01-13 LAB — HIV-1 RNA QUANT-NO REFLEX-BLD
HIV 1 RNA Quant: 24 copies/mL — ABNORMAL HIGH
HIV-1 RNA Quant, Log: 1.38 Log copies/mL — ABNORMAL HIGH

## 2019-01-16 ENCOUNTER — Ambulatory Visit: Payer: Medicaid Other | Attending: Podiatry

## 2019-01-16 ENCOUNTER — Other Ambulatory Visit: Payer: Self-pay

## 2019-01-16 DIAGNOSIS — R262 Difficulty in walking, not elsewhere classified: Secondary | ICD-10-CM

## 2019-01-16 DIAGNOSIS — M25572 Pain in left ankle and joints of left foot: Secondary | ICD-10-CM | POA: Diagnosis not present

## 2019-01-16 DIAGNOSIS — M25571 Pain in right ankle and joints of right foot: Secondary | ICD-10-CM

## 2019-01-16 DIAGNOSIS — M25674 Stiffness of right foot, not elsewhere classified: Secondary | ICD-10-CM | POA: Diagnosis not present

## 2019-01-16 NOTE — Therapy (Signed)
[place], [place], [address] Phone: [phone]   Fax:  [phone]  Physical Therapy Evaluation  Patient Details  Name: Perry Norton MRN: [id number] Date of Birth: 1975/09/24 Referring Provider (PT): Celesta Gentile, DPM   Encounter Date: 01/16/2019  PT End of Session - 01/16/19 1424    Visit Number  1    Number of Visits  12    Date for PT Re-Evaluation  03/03/19    Authorization Type  MCD    PT Start Time  0152    PT Stop Time  0222    PT Time Calculation (min)  30 min    Activity Tolerance  Patient tolerated treatment well;No increased pain    Behavior During Therapy  WFL for tasks assessed/performed       Past Medical History:  Diagnosis Date  . HIV (human immunodeficiency virus infection) ([place])   . Seizures ([place])    epilepsy  . Visual loss 09/07/2018    No past surgical history on file.  There were no vitals filed for this visit.   Subjective Assessment - 01/16/19 1354    Subjective  He reports he has pain in both feet  across metatarsal heads and anterior lower LT and RT leg. Has orthotics but no benefit. no injury, No specific foot exercisds  Does Perry Norton arts    Limitations  Walking;Standing    How long can you stand comfortably?  As needed    How long can you walk comfortably?  As needed    Diagnostic tests  Xrays: Not sure what was seen.    Patient Stated Goals  Decreased pain    Currently in Pain?  Yes    Pain Score  --   mild  at rest but can be severe when on feet.   Pain Location  Foot    Pain Orientation  Left;Right   plantar aspect metatarsal heads   Pain Descriptors / Indicators  Burning;Sharp    Pain Type  Chronic pain    Pain Onset  More than a month ago    Pain Frequency  Constant    Aggravating Factors   activity on feet    Pain Relieving Factors  rest,         [facility] PT Assessment - 01/16/19 0001      Assessment   Medical Diagnosis  plantar fascitis    Referring Provider (PT)  Celesta Gentile, DPM    Onset Date/Surgical Date  --   more than a year   Prior Therapy  None      Precautions   Precautions  None      Restrictions   Weight Bearing Restrictions  No      Balance Screen   Has the patient fallen in the past 6 months  No      Prior Function   Level of Independence  Independent      Cognition   Overall Cognitive Status  Within Functional Limits for tasks assessed      ROM / Strength   AROM / PROM / Strength  AROM;Strength      AROM   AROM Assessment Site  Ankle    Right/Left Ankle  Right;Left    Right Ankle Dorsiflexion  90    Right Ankle Plantar Flexion  50    Right Ankle Inversion  17    Right Ankle Eversion  30    Left Ankle Dorsiflexion  100    Left Ankle  Plantar Flexion  50    Left Ankle Inversion  32    Left Ankle Eversion  25      Strength   Strength Assessment Site  Ankle    Right/Left Ankle  Right;Left      Flexibility   Soft Tissue Assessment /Muscle Length  yes    Hamstrings  80-85 degrees bilaterally                Objective measurements completed on examination: See above findings.              PT Education - 01/16/19 1410    Education Details  POC, Heel cord stretch    Person(s) Educated  Patient    Methods  Explanation;Demonstration;Verbal cues;Handout    Comprehension  Verbalized understanding;Returned demonstration       PT Short Term Goals - 01/16/19 1418      PT SHORT TERM GOAL #1   Title  He will be independent with all HEp issued    Baseline  No program    Time  2    Period  Weeks    Status  New      PT SHORT TERM GOAL #2   Title  DF will be 100 degrees bilaterally at least.    Baseline  90 degrees on RT    Time  2    Period  Weeks    Status  New      PT SHORT TERM GOAL #3   Title  He will report pain decreased 10% or more with wlaking    Baseline  pain severe with activity on feet.    Time  2    Period  Weeks    Status  New        PT Long  Term Goals - 01/16/19 1420      PT LONG TERM GOAL #1   Title  He will be independent with all HEp issued    Baseline  independent with initial HEP    Time  6    Period  Weeks    Status  New      PT LONG TERM GOAL #2   Title  He will report intermittant pain in Both feet and lower leg    Baseline  constant bilateal foot and lower leg pain    Time  6    Period  Weeks    Status  New      PT LONG TERM GOAL #3   Title  105 degrees activve DF to decreas pull and load into feet with walking    Baseline  100 degrees AROM DF bilaterally    Time  6    Period  Weeks    Status  New      PT LONG TERM GOAL #4   Title  He will be able to PF straight up with out forward lean x 10 with min pain    Baseline  unable to PF standing without leaning forward due to metatarsal pain.    Time  6    Period  Weeks    Status  New             Plan - 01/16/19 1425    Clinical Impression Statement  [name] reports chronic foot apin at metatarsal heads and anteroir tibia that is constant . He reports nothing helping. He is not able to run or walk without pain. He appears to have tightness and somewhat high arches  .  Skilled PT should help with pain if he does his HEp consistently.    Personal Factors and Comorbidities  Past/Current Experience;Time since onset of injury/illness/exacerbation    Examination-Activity Limitations  Locomotion Level;Squat;Stand;Stairs    Examination-Participation Restrictions  Community Activity   sports   Stability/Clinical Decision Making  Stable/Uncomplicated    Clinical Decision Making  Low    Rehab Potential  Good    PT Frequency  --   3 sessions   PT Duration  2 weeks   then 2x/week for 4 weeks   PT Treatment/Interventions  Taping;Passive range of motion;Dry needling;Patient/family education;Therapeutic activities;Therapeutic exercise;Balance training;Ultrasound;Moist Heat;Iontophoresis 4mg /ml Dexamethasone    PT Next Visit Plan  REviewe HEp and add strength with  bands. Manual for STW and ROM    PT Home Exercise Plan  stretch DF and PF gastroc /soleus    Consulted and Agree with Plan of Care  Patient       Patient will benefit from skilled therapeutic intervention in order to improve the following deficits and impairments:  Pain, Decreased activity tolerance, Decreased strength, Postural dysfunction, Increased muscle spasms, Difficulty walking  Visit Diagnosis: 1. Pain in right ankle and joints of right foot   2. Pain in left ankle and joints of left foot   3. Stiffness of right foot, not elsewhere classified   4. Difficulty in walking, not elsewhere classified        Problem List Patient Active Problem List   Diagnosis Date Noted  . Generalized seizure disorder (HCC) 11/15/2018  . Visual loss 09/07/2018  . Diaphoresis 01/19/2018  . ED (erectile dysfunction) 01/19/2018  . Left arm pain 10/18/2017  . Localization-related idiopathic epilepsy and epileptic syndromes with seizures of localized onset, not intractable, without status epilepticus (HCC) 07/22/2017  . Intractable migraine without aura and without status migrainosus 07/22/2017  . Chronic pain syndrome 07/22/2017  . Pleurisy 07/05/2017  . AC separation, type 2, left, initial encounter 05/25/2017  . CRI (chronic renal insufficiency) 05/20/2017  . Syphilis 05/20/2017  . Poor dentition 05/20/2017  . Pharyngeal irritation 12/05/2015  . Wheeze 12/05/2015  . Head trauma 06/10/2015  . 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 Seizure (HCC) 06/10/2015  . Brachial plexopathy 06/07/2014  . Nausea 03/19/2014  . Right arm weakness 03/02/2014  . Disseminated zoster 04/09/2011  . Human immunodeficiency virus (HIV) disease (HCC) 11/06/2010  . Numbness and tingling of right arm 11/06/2010    Caprice RedChasse, Kass Herberger M  PT 01/16/2019, 2:29 PM  [facility] [address] [place], [place], [address] Phone: [phone]   Fax:  [phone]  Name: Perry Gingerric  Norton MRN: [id number] Date of Birth: 10-15-1975

## 2019-01-16 NOTE — Patient Instructions (Addendum)
Soleus Stretch    Stand with right foot back, both knees bent. Keeping heel on floor, turned slightly out, lean into wall until stretch is felt in lower calf. Hold __30__ seconds. Repeat __3__ times per set. Do ____ sets per session. Do _3___ sessions per day.  http://orth.exer.us/24   Copyright  VHI. All rights reserved.  Best Stretch    Using a chair for balance if necessary, place one leg back, foot flat on floor, forward leg bent. Slowly shift weight to forward leg until a stretch is felt in back leg. Be sure front knee DOES NOT extend past toes. Hold _30___ seconds. Change legs and repeat. Repeat _3___ times. Do __3__ sessions per day.  http://[email or web address].exer.us/348   Copyright  VHI. All rights reserved.  Plantar Flexion: Stretch - Dorsiflexors    Position Helper: Stabilize left leg. Place other hand on top of foot. Motion -Helper presses foot into pointed position. -Do not allow foot to twist or turn. -Avoid pressing on toes. Hold  30___ seconds. Repeat _3__ times. Repeat with other leg. Do _3__ sessions per day. Variation: Contract method: Resist ___ seconds. (see card G.G.-14)  Copyright  VHI. All rights reserved.

## 2019-01-16 NOTE — Addendum Note (Signed)
Addended by: Darrel Hoover on: 01/16/2019 04:59 PM   Modules accepted: Orders

## 2019-01-20 DIAGNOSIS — F4310 Post-traumatic stress disorder, unspecified: Secondary | ICD-10-CM | POA: Diagnosis not present

## 2019-01-23 ENCOUNTER — Other Ambulatory Visit: Payer: Self-pay

## 2019-01-23 ENCOUNTER — Ambulatory Visit: Payer: Medicaid Other | Attending: Podiatry

## 2019-01-23 DIAGNOSIS — M25571 Pain in right ankle and joints of right foot: Secondary | ICD-10-CM

## 2019-01-23 DIAGNOSIS — M25674 Stiffness of right foot, not elsewhere classified: Secondary | ICD-10-CM | POA: Diagnosis not present

## 2019-01-23 DIAGNOSIS — R262 Difficulty in walking, not elsewhere classified: Secondary | ICD-10-CM

## 2019-01-23 DIAGNOSIS — M25572 Pain in left ankle and joints of left foot: Secondary | ICD-10-CM

## 2019-01-23 NOTE — Patient Instructions (Signed)
Inversion: Resisted   Cross legs with right leg underneath, foot in tubing loop. Hold tubing around other foot to resist and turn foot in. Repeat ____ times per set. Do ____ sets per session. Do ____ sessions per day.  http://orth.exer.us/12   Copyright  VHI. All rights reserved.  Eversion: Resisted   With right foot in tubing loop, hold tubing around other foot to resist and turn foot out. Repeat ____ times per set. Do ____ sets per session. Do ____ sessions per day.  http://orth.exer.us/14   Copyright  VHI. All rights reserved.  Plantar Flexion: Resisted   Anchor behind, tubing around left foot, press down. Repeat ____ times per set. Do ____ sets per session. Do ____ sessions per day.  http://orth.exer.us/10   Copyright  VHI. All rights reserved.  Dorsiflexion: Resisted   Facing anchor, tubing around left foot, pull toward face.  Repeat ____ times per set. Do ____ sets per session. Do ____ sessions per day.  http://orth.exer.us/8   Copyright  VHI. All rights reserved.  All exercises x 20 reps  Daily  RT/LT

## 2019-01-23 NOTE — Therapy (Signed)
[facility] [address] [place], [place], [address] Phone: [phone]   Fax:  [phone]  Physical Therapy Treatment  Patient Details  Name: Perry Norton MRN: [id number] Date of Birth: [birth year] Referring Provider (PT): Ovid CurdMatthew Wagoner, DPM   Encounter Date: 01/23/2019  PT End of Session - 01/23/19 1457    Visit Number  2    Number of Visits  12    Date for PT Re-Evaluation  03/03/19    Authorization Type  MCD    Authorization Time Period  ends 01/30/19    Authorization - Visit Number  1    Authorization - Number of Visits  3    PT Start Time  0256   late for 245 appointment   PT Stop Time  0340    PT Time Calculation (min)  44 min    Activity Tolerance  Patient tolerated treatment well;No increased pain    Behavior During Therapy  WFL for tasks assessed/performed       Past Medical History:  Diagnosis Date  . HIV (human immunodeficiency virus infection) (HCC)   . Seizures (HCC)    epilepsy  . Visual loss 09/07/2018    No past surgical history on file.  There were no vitals filed for this visit.  Subjective Assessment - 01/23/19 1553    Subjective  Ran this AM and had the metatarsal head pain with pain to anterior tib.    Pain Score  2     Pain Location  Foot    Pain Orientation  Right;Left    Pain Descriptors / Indicators  Burning;Sharp    Pain Type  Chronic pain    Pain Onset  More than a month ago    Pain Frequency  Constant    Aggravating Factors   run /walk    Pain Relieving Factors  rest                       [facility] Adult PT Treatment/Exercise - 01/23/19 0001      Exercises   Exercises  Ankle      Manual Therapy   Manual Therapy  Joint mobilization;Soft tissue mobilization;Passive ROM    Joint Mobilization  MTP  , joints forefoot and ankle    Soft tissue mobilization  anterior tibialis     Passive ROM  DF and PF      Ankle Exercises: Stretches   Soleus Stretch  2 reps;30 seconds     Gastroc Stretch  2 reps;30 seconds      Ankle Exercises: Aerobic   Recumbent Bike  L3 5 min      Ankle Exercises: Seated   Towel Crunch Limitations  50 reps RT/Lt       Ankle Exercises: Supine   T-Band  green 4 way x 15             PT Education - 01/23/19 1548    Education Details  band exercise and towell exercises    Person(s) Educated  Patient    Methods  Explanation;Demonstration;Verbal cues;Handout    Comprehension  Verbalized understanding;Returned demonstration       PT Short Term Goals - 01/16/19 1418      PT SHORT TERM GOAL #1   Title  He will be independent with all HEp issued    Baseline  No program    Time  2    Period  Weeks    Status  New  PT SHORT TERM GOAL #2   Title  DF will be 100 degrees bilaterally at least.    Baseline  90 degrees on RT    Time  2    Period  Weeks    Status  New      PT SHORT TERM GOAL #3   Title  He will report pain decreased 10% or more with wlaking    Baseline  pain severe with activity on feet.    Time  2    Period  Weeks    Status  New        PT Long Term Goals - 01/16/19 1420      PT LONG TERM GOAL #1   Title  He will be independent with all HEp issued    Baseline  independent with initial HEP    Time  6    Period  Weeks    Status  New      PT LONG TERM GOAL #2   Title  He will report intermittant pain in Both feet and lower leg    Baseline  constant bilateal foot and lower leg pain    Time  6    Period  Weeks    Status  New      PT LONG TERM GOAL #3   Title  105 degrees activve DF to decreas pull and load into feet with walking    Baseline  100 degrees AROM DF bilaterally    Time  6    Period  Weeks    Status  New      PT LONG TERM GOAL #4   Title  He will be able to PF straight up with out forward lean x 10 with min pain    Baseline  unable to PF standing without leaning forward due to metatarsal pain.    Time  6    Period  Weeks    Status  New            Plan - 01/23/19  1458    Clinical Impression Statement  No changes. Felt better after session.   LT toe DF less than RT with towel exercise.  callous noted Lt 5th metatarsal head    PT Treatment/Interventions  Taping;Passive range of motion;Dry needling;Patient/family education;Therapeutic activities;Therapeutic exercise;Balance training;Ultrasound;Moist Heat;Iontophoresis 4mg /ml Dexamethasone    PT Next Visit Plan  REview band and towel exercise.  manula / STW mobs ROM. ? US  or ionto    PT Home Exercise Plan  stretch DF and PF gastroc /soleus    Consulted and Agree with Plan of Care  Patient       Patient will benefit from skilled therapeutic intervention in order to improve the following deficits and impairments:  Pain, Decreased activity tolerance, Decreased strength, Postural dysfunction, Increased muscle spasms, Difficulty walking  Visit Diagnosis: 1. Pain in left ankle and joints of left foot   2. Pain in right ankle and joints of right foot   3. Stiffness of right foot, not elsewhere classified   4. Difficulty in walking, not elsewhere classified        Problem List Patient Active Problem List   Diagnosis Date Noted  . Generalized seizure disorder (HCC) 11/15/2018  . Visual loss 09/07/2018  . Diaphoresis 01/19/2018  . ED (erectile dysfunction) 01/19/2018  . Left arm pain 10/18/2017  . Localization-related idiopathic epilepsy and epileptic syndromes with seizures of localized onset, not intractable, without status epilepticus (HCC) 07/22/2017  . Intractable migraine  without aura and without status migrainosus 07/22/2017  . Chronic pain syndrome 07/22/2017  . Pleurisy 07/05/2017  . AC separation, type 2, left, initial encounter 05/25/2017  . CRI (chronic renal insufficiency) 05/20/2017  . Syphilis 05/20/2017  . Poor dentition 05/20/2017  . Pharyngeal irritation 12/05/2015  . Wheeze 12/05/2015  . Head trauma 06/10/2015  . 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 Seizure ([place]) 06/10/2015  .  Brachial plexopathy 06/07/2014  . Nausea 03/19/2014  . Right arm weakness 03/02/2014  . Disseminated zoster 04/09/2011  . Human immunodeficiency virus (HIV) disease ([place]) 11/06/2010  . Numbness and tingling of right arm 11/06/2010    Darrel Hoover  PT 01/23/2019, 3:55 PM  [facility] [address] [place], [place], [address] Phone: [phone]   Fax:  [phone]  Name: Perry Norton MRN: [id number] Date of Birth: Oct 22, 1975

## 2019-01-26 ENCOUNTER — Ambulatory Visit: Payer: Medicaid Other

## 2019-01-26 ENCOUNTER — Other Ambulatory Visit: Payer: Self-pay

## 2019-01-26 DIAGNOSIS — M25674 Stiffness of right foot, not elsewhere classified: Secondary | ICD-10-CM

## 2019-01-26 DIAGNOSIS — M25572 Pain in left ankle and joints of left foot: Secondary | ICD-10-CM

## 2019-01-26 DIAGNOSIS — M25571 Pain in right ankle and joints of right foot: Secondary | ICD-10-CM

## 2019-01-26 DIAGNOSIS — R262 Difficulty in walking, not elsewhere classified: Secondary | ICD-10-CM | POA: Diagnosis not present

## 2019-01-26 NOTE — Therapy (Signed)
[place], [place], [address] Phone: [phone]   Fax:  [phone]  Physical Therapy Treatment  Patient Details  Name: Perry Norton MRN: [id number] Date of Birth: Jul 08, 1975 Referring Provider (PT): Celesta Gentile, DPM   Encounter Date: 01/26/2019  PT End of Session - 01/26/19 1222    Visit Number  3    Number of Visits  12    Date for PT Re-Evaluation  03/03/19    Authorization Type  MCD    Authorization Time Period  ends 01/30/19    Authorization - Visit Number  2    Authorization - Number of Visits  3    PT Start Time  9563    PT Stop Time  1300    PT Time Calculation (min)  38 min    Activity Tolerance  Patient tolerated treatment well;No increased pain    Behavior During Therapy  WFL for tasks assessed/performed       Past Medical History:  Diagnosis Date  . HIV (human immunodeficiency virus infection) ([place])   . Seizures ([place])    epilepsy  . Visual loss 09/07/2018    History reviewed. No pertinent surgical history.  There were no vitals filed for this visit.  Subjective Assessment - 01/26/19 1225    Subjective  Did not run today.  Did band exercises    Pain Score  6     Pain Location  Foot    Pain Orientation  Right;Left    Pain Descriptors / Indicators  Burning;Sharp    Pain Type  Chronic pain    Pain Onset  More than a month ago    Pain Frequency  Constant    Aggravating Factors   run walk    Pain Relieving Factors  rest                       [facility] Adult PT Treatment/Exercise - 01/26/19 0001      Manual Therapy   Manual Therapy  Taping    Joint Mobilization  MTP  , joints forefoot and ankle    Passive ROM  DF and PF    Kinesiotex  Create Space      Kinesiotix   Create Space  attempted [employer] support tape but he used vasoline and tape would not stick even after  washing the area      Ankle Exercises: Aerobic   Recumbent Bike  L3 5 min      Ankle  Exercises: Stretches   Soleus Stretch  3 reps;30 seconds    Gastroc Stretch  3 reps;30 seconds    Other Stretch  Also stretch with soft ball and with tennis ball  each foo to arch and plantar aspect of each foor      He was able to demo HEP correctly         PT Short Term Goals - 01/16/19 1418      PT SHORT TERM GOAL #1   Title  He will be independent with all HEp issued    Baseline  No program    Time  2    Period  Weeks    Status  New      PT SHORT TERM GOAL #2   Title  DF will be 100 degrees bilaterally at least.    Baseline  90 degrees on RT    Time  2    Period  Weeks    Status  New      PT SHORT TERM GOAL #3   Title  He will report pain decreased 10% or more with wlaking    Baseline  pain severe with activity on feet.    Time  2    Period  Weeks    Status  New        PT Long Term Goals - 01/16/19 1420      PT LONG TERM GOAL #1   Title  He will be independent with all HEp issued    Baseline  independent with initial HEP    Time  6    Period  Weeks    Status  New      PT LONG TERM GOAL #2   Title  He will report intermittant pain in Both feet and lower leg    Baseline  constant bilateal foot and lower leg pain    Time  6    Period  Weeks    Status  New      PT LONG TERM GOAL #3   Title  105 degrees activve DF to decreas pull and load into feet with walking    Baseline  100 degrees AROM DF bilaterally    Time  6    Period  Weeks    Status  New      PT LONG TERM GOAL #4   Title  He will be able to PF straight up with out forward lean x 10 with min pain    Baseline  unable to PF standing without leaning forward due to metatarsal pain.    Time  6    Period  Weeks    Status  New            Plan - 01/26/19 1226    Clinical Impression Statement  Reports a 6/10 pain but no limp either leg walking.    PT Treatment/Interventions  Taping;Passive range of motion;Dry needling;Patient/family education;Therapeutic activities;Therapeutic  exercise;Balance training;Ultrasound;Moist Heat;Iontophoresis 4mg /ml Dexamethasone    PT Next Visit Plan  .  manual / STW mobs ROM.  taping for arch support and Ant tib if he does not lotion or vaseline foot next week    PT Home Exercise Plan  stretch DF and PF gastroc /soleus    Consulted and Agree with Plan of Care  Patient       Patient will benefit from skilled therapeutic intervention in order to improve the following deficits and impairments:  Pain, Decreased activity tolerance, Decreased strength, Postural dysfunction, Increased muscle spasms, Difficulty walking  Visit Diagnosis: 1. Pain in right ankle and joints of right foot   2. Pain in left ankle and joints of left foot   3. Stiffness of right foot, not elsewhere classified   4. Difficulty in walking, not elsewhere classified        Problem List Patient Active Problem List   Diagnosis Date Noted  . Generalized seizure disorder (HCC) 11/15/2018  . Visual loss 09/07/2018  . Diaphoresis 01/19/2018  . ED (erectile dysfunction) 01/19/2018  . Left arm pain 10/18/2017  . Localization-related idiopathic epilepsy and epileptic syndromes with seizures of localized onset, not intractable, without status epilepticus (HCC) 07/22/2017  . Intractable migraine without aura and without status migrainosus 07/22/2017  . Chronic pain syndrome 07/22/2017  . Pleurisy 07/05/2017  . AC separation, type 2, left, initial encounter 05/25/2017  . CRI (chronic renal insufficiency) 05/20/2017  . Syphilis 05/20/2017  . Poor dentition 05/20/2017  . Pharyngeal irritation 12/05/2015  .  Wheeze 12/05/2015  . Head trauma 06/10/2015  . 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 Seizure (HCC) 06/10/2015  . Brachial plexopathy 06/07/2014  . Nausea 03/19/2014  . Right arm weakness 03/02/2014  . Disseminated zoster 04/09/2011  . Human immunodeficiency virus (HIV) disease (HCC) 11/06/2010  . Numbness and tingling of right arm 11/06/2010    Caprice RedChasse, Johnross Nabozny M   PT 01/26/2019, 1:28 PM  [facility] [address] [place], [place], [address] Phone: [phone]   Fax:  [phone]  Name: Perry Gingerric Norton MRN: [id number] Date of Birth: August 18, 1975

## 2019-01-30 ENCOUNTER — Encounter: Payer: Self-pay | Admitting: Nurse Practitioner

## 2019-01-30 ENCOUNTER — Ambulatory Visit: Payer: Medicaid Other

## 2019-01-31 ENCOUNTER — Ambulatory Visit: Payer: Medicaid Other | Admitting: Nurse Practitioner

## 2019-02-02 ENCOUNTER — Other Ambulatory Visit: Payer: Self-pay

## 2019-02-02 ENCOUNTER — Encounter: Payer: Self-pay | Admitting: Family Medicine

## 2019-02-02 ENCOUNTER — Ambulatory Visit: Payer: Medicaid Other | Admitting: *Deleted

## 2019-02-02 ENCOUNTER — Ambulatory Visit: Payer: Medicaid Other | Admitting: Podiatry

## 2019-02-02 ENCOUNTER — Ambulatory Visit: Payer: Medicaid Other | Attending: Family Medicine | Admitting: Family Medicine

## 2019-02-02 VITALS — Temp 97.3°F

## 2019-02-02 DIAGNOSIS — M5412 Radiculopathy, cervical region: Secondary | ICD-10-CM

## 2019-02-02 DIAGNOSIS — M25512 Pain in left shoulder: Secondary | ICD-10-CM

## 2019-02-02 DIAGNOSIS — M5020 Other cervical disc displacement, unspecified cervical region: Secondary | ICD-10-CM

## 2019-02-02 DIAGNOSIS — G894 Chronic pain syndrome: Secondary | ICD-10-CM

## 2019-02-02 DIAGNOSIS — G8929 Other chronic pain: Secondary | ICD-10-CM

## 2019-02-02 DIAGNOSIS — M722 Plantar fascial fibromatosis: Secondary | ICD-10-CM | POA: Diagnosis not present

## 2019-02-02 DIAGNOSIS — Z1159 Encounter for screening for other viral diseases: Secondary | ICD-10-CM
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
## 2019-02-02 NOTE — Progress Notes (Signed)
Est care and per pt his neck shoulder and back hurts  Patient refused to have staff ask him the depression questions

## 2019-02-02 NOTE — Progress Notes (Signed)
Pt came in for COVID testing today per PCP. Was advised of result turn around time.

## 2019-02-03 ENCOUNTER — Ambulatory Visit: Payer: Medicaid Other | Admitting: Family Medicine

## 2019-02-03 ENCOUNTER — Encounter: Payer: Self-pay | Admitting: Family Medicine

## 2019-02-03 ENCOUNTER — Ambulatory Visit (INDEPENDENT_AMBULATORY_CARE_PROVIDER_SITE_OTHER): Payer: Medicaid Other | Admitting: Family Medicine

## 2019-02-03 ENCOUNTER — Telehealth: Payer: Self-pay | Admitting: *Deleted

## 2019-02-03 DIAGNOSIS — G894 Chronic pain syndrome: Secondary | ICD-10-CM | POA: Diagnosis not present

## 2019-02-03 DIAGNOSIS — M542 Cervicalgia: Secondary | ICD-10-CM

## 2019-02-03 DIAGNOSIS — G629 Polyneuropathy, unspecified: Secondary | ICD-10-CM

## 2019-02-03 DIAGNOSIS — M779 Enthesopathy, unspecified: Secondary | ICD-10-CM

## 2019-02-03 DIAGNOSIS — M722 Plantar fascial fibromatosis: Secondary | ICD-10-CM

## 2019-02-03 MED ORDER — PREGABALIN 75 MG PO CAPS: 75.0000 mg | ORAL_CAPSULE | Freq: Two times a day (BID) | ORAL | 3 refills | Status: DC

## 2019-02-03 MED ORDER — VITAMIN D-3 125 MCG (5000 UT) PO TABS: ORAL_TABLET | ORAL | 3 refills | Status: DC

## 2019-02-03 NOTE — Telephone Encounter (Signed)
-----   Message from Trula Slade, DPM sent at 02/02/2019 11:38 AM EDT ----- Can you check on PT for him? He had 2 visits and it was helpful can you see if they can extend it?

## 2019-02-03 NOTE — Progress Notes (Signed)
Office Visit Note   Patient: Perry Norton           Date of Birth: 1975/09/24           MRN: [id number] Visit Date: 02/03/2019 Requested by: Cain SaupeFulp, Cammie, MD [address] [place],  [place] [id number] PCP: Patient, No Pcp Per  Subjective: Chief Complaint  Patient presents with  . Neck - Pain    Chronic pain in the neck and left shoulder post a fall back in the early 2000s.  . Left Shoulder - Pain    HPI: He is here with neck and bilateral arm pain.  Chronic pain after falling about 10 years ago.  We have seen him in our clinic and he had an MRI scan in 2019 showing C4-5 left foraminal protrusion with moderate narrowing of the foramen, and small central protrusions at C 5 6 and 6 7.  He has tried a variety of medications in the past, he states that hydrocodone was the only thing that helped.  He has had injections in the past which did not work.  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              ROS: Denies fevers or chills.  All other systems were reviewed and are negative.  Objective: Vital Signs: There were no vitals taken for this visit.  Physical Exam:  General:  Alert and oriented, in no acute distress. Pulm:  Breathing unlabored. Psy:  Normal mood, congruent affect. Skin: No rash on the skin. Neck: He did not move his neck well at all today.  He has hypersensitivity to light touch of the C7 area and also upper thoracic line.  Upper extremity strength and reflexes remain normal.  Imaging: None today.  Assessment & Plan: 1.  Chronic neck pain with MRI findings as above.  Nonfocal neurologic exam. -I told him we do not prescribe hydrocodone for chronic pain but I could not refer him to pain clinic for this.  He also wants consultation with neurosurgeon. -Prescription for Lyrica.  We will also treat his severe vitamin D deficiency.  Follow-up as needed with us.     Procedures: No procedures performed  No notes on file     PMFS  History: Patient Active Problem List   Diagnosis Date Noted  . Generalized seizure disorder (HCC) 11/15/2018  . Visual loss 09/07/2018  . Diaphoresis 01/19/2018  . ED (erectile dysfunction) 01/19/2018  . Left arm pain 10/18/2017  . Localization-related idiopathic epilepsy and epileptic syndromes with seizures of localized onset, not intractable, without status epilepticus (HCC) 07/22/2017  . Intractable migraine without aura and without status migrainosus 07/22/2017  . Chronic pain syndrome 07/22/2017  . Pleurisy 07/05/2017  . AC separation, type 2, left, initial encounter 05/25/2017  . CRI (chronic renal insufficiency) 05/20/2017  . Syphilis 05/20/2017  . Poor dentition 05/20/2017  . Pharyngeal irritation 12/05/2015  . Wheeze 12/05/2015  . Head trauma 06/10/2015  . 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 Seizure (HCC) 06/10/2015  . Brachial plexopathy 06/07/2014  . Nausea 03/19/2014  . Right arm weakness 03/02/2014  . Disseminated zoster 04/09/2011  . Human immunodeficiency virus (HIV) disease (HCC) 11/06/2010  . Numbness and tingling of right arm 11/06/2010   Past Medical History:  Diagnosis Date  . HIV (human immunodeficiency virus infection) (HCC)   . Seizures (HCC)    epilepsy  . Visual loss 09/07/2018    Family History  Problem  Relation Age of Onset  . Sarcoidosis Mother     Past Surgical History:  Procedure Laterality Date  . NO PAST SURGERIES     Social History   Occupational History  . Not on file  Tobacco Use  . Smoking status: Current Some Day Smoker    Packs/day: 1.00    Types: Cigarettes    Start date: 06/22/1994  . Smokeless tobacco: Never Used  Substance and Sexual Activity  . Alcohol use: Yes    Alcohol/week: 3.0 standard drinks    Types: 3 Cans of beer per week    Comment: occ  . Drug use: Yes    Types: Marijuana  . Sexual activity: Not on file

## 2019-02-03 NOTE — Telephone Encounter (Signed)
Faxed extension to [facility].

## 2019-02-05 LAB — SPECIMEN STATUS REPORT

## 2019-02-05 LAB — NOVEL CORONAVIRUS, NAA: SARS-CoV-2, NAA: NOT DETECTED

## 2019-02-06 ENCOUNTER — Other Ambulatory Visit: Payer: Self-pay | Admitting: Family Medicine

## 2019-02-06 MED ORDER — GABAPENTIN 300 MG PO CAPS: 300.0000 mg | ORAL_CAPSULE | Freq: Three times a day (TID) | ORAL | 3 refills | Status: DC

## 2019-02-07 ENCOUNTER — Telehealth: Payer: Self-pay | Admitting: General Practice

## 2019-02-07 NOTE — Telephone Encounter (Signed)
Spoke with patient and he just recently got tested on the 13th for Aug for Covid-19 testing and it came back Negative. Per pt he would like to know what to do because he is not feeling well.

## 2019-02-07 NOTE — Telephone Encounter (Signed)
He may want to make an office visit or be seen at urgent care or the ED. I am not sure what symptoms he is currently having. He can also try over the counter medication depending on his symptoms

## 2019-02-07 NOTE — Telephone Encounter (Signed)
New Message   Pt calling to figure out whats going on with his body, he states that his throat hurts and its really hard to swallow and he is very concened he ran a fever yesterday but it broke through the night and he is having bad sweats, Please f/u

## 2019-02-07 NOTE — Telephone Encounter (Signed)
See below

## 2019-02-08 ENCOUNTER — Other Ambulatory Visit: Payer: Medicaid Other

## 2019-02-09 ENCOUNTER — Ambulatory Visit (INDEPENDENT_AMBULATORY_CARE_PROVIDER_SITE_OTHER): Payer: Medicaid Other

## 2019-02-09 ENCOUNTER — Other Ambulatory Visit: Payer: Self-pay

## 2019-02-09 ENCOUNTER — Ambulatory Visit (INDEPENDENT_AMBULATORY_CARE_PROVIDER_SITE_OTHER): Payer: Medicaid Other | Admitting: Primary Care

## 2019-02-09 ENCOUNTER — Encounter (INDEPENDENT_AMBULATORY_CARE_PROVIDER_SITE_OTHER): Payer: Self-pay

## 2019-02-09 DIAGNOSIS — M722 Plantar fascial fibromatosis: Secondary | ICD-10-CM | POA: Diagnosis not present

## 2019-02-09 NOTE — Telephone Encounter (Signed)
He is supposed to be establishing care with another PCP. That is what his appointment was for with Dr. Chapman Fitch.

## 2019-02-09 NOTE — Telephone Encounter (Signed)
Please refer to my previous note. I am no longer seeing this patient. All messages should be referred to his current PCP

## 2019-02-10 ENCOUNTER — Telehealth: Payer: Self-pay | Admitting: General Practice

## 2019-02-10 NOTE — Telephone Encounter (Signed)
Please ask that patient go to urgent care for further evaluation of his sore throat and fever. I am out of the office until Monday and apparently there is not another provider who can see him in the meantime but you can also ask the front desk if he can be seen at [facility] or [place] if there are openings

## 2019-02-10 NOTE — Telephone Encounter (Signed)
LMOM

## 2019-02-12 DIAGNOSIS — M722 Plantar fascial fibromatosis: Secondary | ICD-10-CM

## 2019-02-12 HISTORY — DX: Plantar fascial fibromatosis: M72.2

## 2019-02-12 NOTE — Progress Notes (Signed)
Subjective: 43 year old male presents the office today for follow-up evaluation of bilateral foot pain.  He does report the physical therapy has been helpful.  Discussed doing a taping to his feet but has not had this.  He gets pain still in the arch of the foot.   He still describes sharp pains to his feet at times.  He has had several treatments for what appears to be neuropathy and has seen neurology previously and has not been helpful for his feet.  Therefore we have been focusing more on the tendinitis, plantar fasciitis symptoms.   Denies any recent injury or trauma to his feet denies any swelling or redness.  He has no other concerns.   Objective: AAO x3, NAD There is no area pinpoint tenderness identified today.  There is still some tenderness on the medial band plantar fashion the arch of the foot and mildly dorsal aspect of foot.  Negative Tinel sign. No open lesions or pre-ulcerative lesions.  No pain with calf compression, swelling, warmth, erythema  Assessment: Bilateral foot pain, tendinitis with some component of likely neuropathy  Plan: -All treatment options discussed with the patient including all alternatives, risks, complications.  -Physical therapy has been helpful. Bilateral plantar fascial tapings were applied today.  Continue stretching, icing discussed shoe modifications and orthotics.  Trula Slade DPM

## 2019-02-14 DIAGNOSIS — M542 Cervicalgia: Secondary | ICD-10-CM | POA: Diagnosis not present

## 2019-02-20 ENCOUNTER — Ambulatory Visit: Payer: Medicaid Other

## 2019-03-02 ENCOUNTER — Ambulatory Visit: Payer: Medicaid Other | Admitting: Podiatry

## 2019-03-06 ENCOUNTER — Ambulatory Visit: Payer: Medicaid Other | Attending: Podiatry

## 2019-03-06 ENCOUNTER — Other Ambulatory Visit: Payer: Self-pay

## 2019-03-06 DIAGNOSIS — M25572 Pain in left ankle and joints of left foot: Secondary | ICD-10-CM | POA: Insufficient documentation

## 2019-03-06 DIAGNOSIS — R262 Difficulty in walking, not elsewhere classified: Secondary | ICD-10-CM

## 2019-03-06 DIAGNOSIS — M25571 Pain in right ankle and joints of right foot: Secondary | ICD-10-CM | POA: Insufficient documentation

## 2019-03-06 DIAGNOSIS — M25674 Stiffness of right foot, not elsewhere classified: Secondary | ICD-10-CM | POA: Insufficient documentation

## 2019-03-06 NOTE — Patient Instructions (Signed)
Toe Curl: Bilateral   With both feet resting on towel, slowly bunch up towel by curling toes. Hold ____ seconds. Repeat _50___ times per set. Do _1-2___ sets per session. Do _1-2___ sessions per day.  http://orth.exer.us/20   Copyright  VHI. All rights reserved.  Ankle Plantar Flexion / Dorsiflexion, Standing ssions per day.  ANKLE: Eversion, Bilateral (Band)   Place band around feet. Keeping heels on floor, raise toes of both feet up and away from body. Do not move hips. Hold ___ seconds. Use __green______ band. _20__ reps per set, _1-2__ sets per day, ___ days per week  Copyright  VHI. All rights reserved.    Inversion: Resisted   Cross legs with right leg underneath, foot in tubing loop. Hold tubing around other foot to resist and turn foot in.  Repeat _20___ times per set. Do _1-2___ sets per session. Do _2___ sessions per day.  http://orth.exer.us/13   Copyright  VHI. All rights reserved.

## 2019-03-06 NOTE — Therapy (Signed)
[place], [place], [address] Phone: [phone]   Fax:  [phone]  Physical Therapy Treatment  Patient Details  Name: Perry Norton MRN: [id number] Date of Birth: [birth year] Referring Provider (PT): Celesta Gentile, DPM   Encounter Date: 03/06/2019  PT End of Session - 03/06/19 1125    Visit Number  4    Number of Visits  12    Date for PT Re-Evaluation  04/07/19    Authorization Type  MCD    PT Start Time  1100   He was late for appointment   PT Stop Time  1123    PT Time Calculation (min)  23 min    Activity Tolerance  Patient tolerated treatment well;No increased pain    Behavior During Therapy  WFL for tasks assessed/performed       Past Medical History:  Diagnosis Date  . HIV (human immunodeficiency virus infection) ([place])   . Seizures ([place])    epilepsy  . Visual loss 09/07/2018    Past Surgical History:  Procedure Laterality Date  . NO PAST SURGERIES      There were no vitals filed for this visit.  Subjective Assessment - 03/06/19 1101    Subjective  nothing different . Using tennis ball and green band . This  will loosen feet up.    Pain Score  8     Pain Location  --   meta tarsal heads RT   /LT   Pain Orientation  Right;Left    Pain Descriptors / Indicators  Burning;Sharp    Pain Type  Chronic pain    Pain Frequency  Constant    Aggravating Factors   run /walk    Pain Relieving Factors  rest         [facility] PT Assessment - 03/06/19 0001      Assessment   Medical Diagnosis  plantar fascitis    Referring Provider (PT)  Celesta Gentile, DPM      AROM   Right Ankle Dorsiflexion  96    Right Ankle Plantar Flexion  50    Right Ankle Inversion  28    Right Ankle Eversion  20    Left Ankle Dorsiflexion  100    Left Ankle Plantar Flexion  50    Left Ankle Inversion  32    Left Ankle Eversion  22      Strength   Right Ankle Dorsiflexion  5/5    Right Ankle Plantar Flexion   5/5    Right Ankle Inversion  5/5    Right Ankle Eversion  5/5    Left Ankle Dorsiflexion  5/5    Left Ankle Plantar Flexion  5/5    Left Ankle Inversion  5/5    Left Ankle Eversion  5/5      Flexibility   Hamstrings  80-85 degrees bilaterally                   [facility] Adult PT Treatment/Exercise - 03/06/19 0001      Exercises   Exercises  Ankle      Ankle Exercises: Stretches   Soleus Stretch  3 reps;30 seconds    Gastroc Stretch  2 reps;30 seconds      Ankle Exercises: Seated   Towel Crunch Limitations  50 reps RT/Lt       Ankle Exercises: Supine   T-Band  green 4 way x 15  PT Education - 03/06/19 1117    Education Details  revieewed band exer / towel exerciss    Person(s) Educated  Patient    Methods  Explanation    Comprehension  Verbalized understanding;Returned demonstration       PT Short Term Goals - 03/06/19 1102      PT SHORT TERM GOAL #1   Title  He will be independent with all HEp issued    Status  Achieved      PT SHORT TERM GOAL #2   Title  DF will be 100 degrees bilaterally at least.    Baseline  96 degrees RT   LT  100    Status  Partially Met      PT SHORT TERM GOAL #3   Title  He will report pain decreased 10% or more with wlaking    Baseline  no change    Status  On-going        PT Long Term Goals - 01/16/19 1420      PT LONG TERM GOAL #1   Title  He will be independent with all HEp issued    Baseline  independent with initial HEP    Time  6    Period  Weeks    Status  New      PT LONG TERM GOAL #2   Title  He will report intermittant pain in Both feet and lower leg    Baseline  constant bilateal foot and lower leg pain    Time  6    Period  Weeks    Status  New      PT LONG TERM GOAL #3   Title  105 degrees activve DF to decreas pull and load into feet with walking    Baseline  100 degrees AROM DF bilaterally    Time  6    Period  Weeks    Status  New      PT LONG TERM GOAL #4   Title  He  will be able to PF straight up with out forward lean x 10 with min pain    Baseline  unable to PF standing without leaning forward due to metatarsal pain.    Time  6    Period  Weeks    Status  New            Plan - 03/06/19 1108    Clinical Impression Statement  Mr Wherry returns and was able to demo HEP correctly though he seemed  to some what disinterested ads he was on his phone and needed some cuing to slow pace of exercsie.   He felt the exerciss have loosened his feet and his RT DF had improved. His reported pain level of 8/10 seemed some what excdessive as he did not limp walking in or out of clinic. He reported tapeing by podiatrist was helpful and wants to return for this treatment. We can assesss this and teach him to self tapr.   A short stint of PT may be beneficia to incr ROm and decr pain. l.    PT Treatment/Interventions  Taping;Passive range of motion;Dry needling;Patient/family education;Therapeutic activities;Therapeutic exercise;Balance training;Ultrasound;Moist Heat;Iontophoresis 52m/ml Dexamethasone    PT Next Visit Plan  .  manual / STW mobs ROM.  taping for arch support and Ant tib if he does not lotion or vaseline foot next week   HEP strengthening    PT Home Exercise Plan  stretch DF and PF gastroc /soleus, towell  exercisse. 4 way band exerciss    Consulted and Agree with Plan of Care  Patient       Patient will benefit from skilled therapeutic intervention in order to improve the following deficits and impairments:  Pain, Decreased activity tolerance, Decreased strength, Postural dysfunction, Increased muscle spasms, Difficulty walking  Visit Diagnosis: Pain in right ankle and joints of right foot  Pain in left ankle and joints of left foot  Stiffness of right foot, not elsewhere classified  Difficulty in walking, not elsewhere classified     Problem List Patient Active Problem List   Diagnosis Date Noted  . Plantar fasciitis 02/12/2019  .  Generalized seizure disorder ([place]) 11/15/2018  . Visual loss 09/07/2018  . Diaphoresis 01/19/2018  . ED (erectile dysfunction) 01/19/2018  . Left arm pain 10/18/2017  . Localization-related idiopathic epilepsy and epileptic syndromes with seizures of localized onset, not intractable, without status epilepticus ([place]) 07/22/2017  . Intractable migraine without aura and without status migrainosus 07/22/2017  . Chronic pain syndrome 07/22/2017  . Pleurisy 07/05/2017  . AC separation, type 2, left, initial encounter 05/25/2017  . CRI (chronic renal insufficiency) 05/20/2017  . Syphilis 05/20/2017  . Poor dentition 05/20/2017  . Pharyngeal irritation 12/05/2015  . Wheeze 12/05/2015  . Head trauma 06/10/2015  . 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 Seizure ([place]) 06/10/2015  . Brachial plexopathy 06/07/2014  . Nausea 03/19/2014  . Right arm weakness 03/02/2014  . Disseminated zoster 04/09/2011  . Human immunodeficiency virus (HIV) disease ([place]) 11/06/2010  . Numbness and tingling of right arm 11/06/2010    Darrel Hoover  PT 03/06/2019, 11:31 AM  [facility] [address] [place], [place], [address] Phone: [phone]   Fax:  [phone]  Name: Ivin Rosenbloom MRN: [id number] Date of Birth: 06-08-1976

## 2019-03-07 ENCOUNTER — Encounter: Payer: Medicaid Other | Admitting: Physical Medicine and Rehabilitation

## 2019-03-07 ENCOUNTER — Encounter: Payer: Self-pay | Admitting: Physical Medicine and Rehabilitation

## 2019-03-10 DIAGNOSIS — F4310 Post-traumatic stress disorder, unspecified: Secondary | ICD-10-CM | POA: Diagnosis not present

## 2019-03-13 ENCOUNTER — Ambulatory Visit: Payer: Self-pay | Admitting: Podiatry

## 2019-03-16 ENCOUNTER — Ambulatory Visit: Payer: Medicaid Other

## 2019-03-17 NOTE — Progress Notes (Signed)
Patient is here today for bilateral plantar fascial strapping.  Strapping applied, instructions were given.  Patient is to follow-up at regular scheduled appointment.

## 2019-03-20 ENCOUNTER — Ambulatory Visit: Payer: Medicaid Other

## 2019-03-20 ENCOUNTER — Other Ambulatory Visit: Payer: Self-pay

## 2019-03-20 DIAGNOSIS — M25572 Pain in left ankle and joints of left foot: Secondary | ICD-10-CM | POA: Diagnosis not present

## 2019-03-20 DIAGNOSIS — M25674 Stiffness of right foot, not elsewhere classified: Secondary | ICD-10-CM | POA: Diagnosis not present

## 2019-03-20 DIAGNOSIS — M25571 Pain in right ankle and joints of right foot: Secondary | ICD-10-CM | POA: Diagnosis not present

## 2019-03-20 DIAGNOSIS — R262 Difficulty in walking, not elsewhere classified: Secondary | ICD-10-CM

## 2019-03-20 NOTE — Therapy (Addendum)
[place], [place], [address] Phone: [phone]   Fax:  [phone]  Physical Therapy Treatment/Discharge  Patient Details  Name: Perry Norton MRN: [id number] Date of Birth: 08/17/1975 Referring Provider (PT): Celesta Gentile, DPM   Encounter Date: 03/20/2019  PT End of Session - 03/20/19 1221    Visit Number  5    Number of Visits  12    Date for PT Re-Evaluation  04/07/19    Authorization Type  MCD    Authorization Time Period  9/17/to 04/05/19    Authorization - Visit Number  2    Authorization - Number of Visits  8    PT Start Time  0017    Activity Tolerance  Patient tolerated treatment well;No increased pain    Behavior During Therapy  WFL for tasks assessed/performed       Past Medical History:  Diagnosis Date  . HIV (human immunodeficiency virus infection) ([place])   . Seizures ([place])    epilepsy  . Visual loss 09/07/2018    Past Surgical History:  Procedure Laterality Date  . NO PAST SURGERIES      There were no vitals filed for this visit.  Subjective Assessment - 03/20/19 1223    Subjective  He reports  sore as heel with foot pain.     Has not had  feet retaped.   He reported  brother was in [place].  and died. Had to make funeral arraighments.   No improvement in foot pain    Pain Score  8     Pain Location  --   bilateral feet   Pain Orientation  Right;Left    Pain Descriptors / Indicators  Aching;Burning    Pain Type  Chronic pain    Pain Onset  More than a month ago    Pain Frequency  Constant    Aggravating Factors   activity on feet    Pain Relieving Factors  rest                       [facility] Adult PT Treatment/Exercise - 03/20/19 0001      Manual Therapy   Joint Mobilization  MTP  , joints forefoot, toes and ankle  bilaterally    Soft tissue mobilization  anterior tibialis     Passive ROM  DF and PF      Kinesiotix   Create Space  plantarfascia taping with  kineseotaping                PT Short Term Goals - 03/06/19 1102      PT SHORT TERM GOAL #1   Title  He will be independent with all HEp issued    Status  Achieved      PT SHORT TERM GOAL #2   Title  DF will be 100 degrees bilaterally at least.    Baseline  96 degrees RT   LT  100    Status  Partially Met      PT SHORT TERM GOAL #3   Title  He will report pain decreased 10% or more with wlaking    Baseline  no change    Status  On-going        PT Long Term Goals - 01/16/19 1420      PT LONG TERM GOAL #1   Title  He will be independent with all HEp issued    Baseline  independent with initial HEP  Time  6    Period  Weeks    Status  New      PT LONG TERM GOAL #2   Title  He will report intermittant pain in Both feet and lower leg    Baseline  constant bilateal foot and lower leg pain    Time  6    Period  Weeks    Status  New      PT LONG TERM GOAL #3   Title  105 degrees activve DF to decreas pull and load into feet with walking    Baseline  100 degrees AROM DF bilaterally    Time  6    Period  Weeks    Status  New      PT LONG TERM GOAL #4   Title  He will be able to PF straight up with out forward lean x 10 with min pain    Baseline  unable to PF standing without leaning forward due to metatarsal pain.    Time  6    Period  Weeks    Status  New            Plan - 03/20/19 1225    Clinical Impression Statement  No improvement and he appeared intoxicated tpoday.      appearing to fall asleep at times.  Post session he reported  his feet felt much better.  He will see PMR MD tomorrow    PT Treatment/Interventions  Taping;Passive range of motion;Dry needling;Patient/family education;Therapeutic activities;Therapeutic exercise;Balance training;Ultrasound;Moist Heat;Iontophoresis 36m/ml Dexamethasone    PT Next Visit Plan  .  manual / STW mobs ROM.  taping for arch support and Ant tib if he does not lotion or vaseline foot next week    PT Home  Exercise Plan  stretch DF and PF gastroc /soleus, towell exercisse. 4 way band exerciss    Consulted and Agree with Plan of Care  Patient       Patient will benefit from skilled therapeutic intervention in order to improve the following deficits and impairments:  Pain, Decreased activity tolerance, Decreased strength, Postural dysfunction, Increased muscle spasms, Difficulty walking  Visit Diagnosis: Pain in left ankle and joints of left foot  Stiffness of right foot, not elsewhere classified  Difficulty in walking, not elsewhere classified     Problem List Patient Active Problem List   Diagnosis Date Noted  . Plantar fasciitis 02/12/2019  . [place]ized seizure disorder ([place] 11/15/2018  . Visual loss 09/07/2018  . Diaphoresis 01/19/2018  . ED (erectile dysfunction) 01/19/2018  . Left arm pain 10/18/2017  . Localization-related idiopathic epilepsy and epileptic syndromes with seizures of localized onset, not intractable, without status epilepticus ([place] 07/22/2017  . Intractable migraine without aura and without status migrainosus 07/22/2017  . Chronic pain syndrome 07/22/2017  . Pleurisy 07/05/2017  . AC separation, type 2, left, initial encounter 05/25/2017  . CRI (chronic renal insufficiency) 05/20/2017  . Syphilis 05/20/2017  . Poor dentition 05/20/2017  . Pharyngeal irritation 12/05/2015  . Wheeze 12/05/2015  . Head trauma 06/10/2015  . 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 Seizure ([place] 06/10/2015  . Brachial plexopathy 06/07/2014  . Nausea 03/19/2014  . Right arm weakness 03/02/2014  . Disseminated zoster 04/09/2011  . [place] immunodeficiency virus (HIV) disease ([place] 11/06/2010  . Numbness and tingling of right arm 11/06/2010    CDarrel Hoover PT 03/20/2019, 1:28 PM  [facility] [address]Phone:  [phone]   Fax:  [phone]  Name: Perry Norton MRN: [id number] Date of  Birth: [birth year]  PHYSICAL THERAPY DISCHARGE SUMMARY  Visits from Start of Care: 5  Current functional level related to goals / functional outcomes: Unknown He was inconsistent in attendance and no showed his last 3 appointments and was discharged.   Remaining deficits: Unknown   Education / Equipment: HEP Plan:                                                    Patient goals were not met. Patient is being discharged due to not returning since the last visit.  ?????  Pearson Forster PT   04/03/19

## 2019-03-21 ENCOUNTER — Encounter
Payer: Medicaid Other | Attending: Physical Medicine and Rehabilitation | Admitting: Physical Medicine and Rehabilitation

## 2019-03-21 ENCOUNTER — Encounter: Payer: Self-pay | Admitting: Physical Medicine and Rehabilitation

## 2019-03-21 VITALS — BP 123/79 | HR 91 | Temp 97.5°F | Ht 72.0 in | Wt 166.0 lb

## 2019-03-21 DIAGNOSIS — G9520 Unspecified cord compression: Secondary | ICD-10-CM | POA: Insufficient documentation

## 2019-03-21 DIAGNOSIS — M79602 Pain in left arm: Secondary | ICD-10-CM

## 2019-03-21 DIAGNOSIS — G540 Brachial plexus disorders: Secondary | ICD-10-CM

## 2019-03-21 DIAGNOSIS — M4712 Other spondylosis with myelopathy, cervical region: Secondary | ICD-10-CM

## 2019-03-21 HISTORY — DX: Unspecified cord compression: G95.20

## 2019-03-21 MED ORDER — DULOXETINE HCL 30 MG PO CPEP: 30.0000 mg | ORAL_CAPSULE | Freq: Every day | ORAL | 5 refills | Status: DC

## 2019-03-21 NOTE — Patient Instructions (Signed)
Assessment and Plan: 1. 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 Nerve pain- severe in LUE   - will try Duloxetine /Cymbalta- 30 mg daily x 1 week then 60 mg daily.  - Will also call -Neurologist- in [place]- is male- can call me and let me know- to see if can try Trileptal for nerve pain (since on multiple anti-seizure medicines for seizures  - need to get Cervical MRI scheduled to see if exam c/w MRI- might/could need cervical surgery to decompress/fuse- most likely cause of pain- Although al to of pain is in shoulder, it STARTS in L neck, so cervical spine more likely cause of pain than shoulder- unless shoulder is SECOND cause as well.  Of note, had an episode of staring at end of appointment- nonresponsive to all verbal stimuli.  Trying to put his mind somewhere else.

## 2019-03-21 NOTE — Progress Notes (Signed)
Pt is a 43 yr old R handed male with cervical stenosis.   LUE will go numb from neck to finger tips- Also feels like being shot.  Feels like EMG/NCS all the time. When gets feeling back, a real nasty shock. 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  Supposed to get a new MRI of cervical spine and L shoulder.     Tried: Takes Gabapenetin 1600 mg TID Does nothing- has been on it for years. No body has said anything about doing surgery on neck. Meloxicam doesn't help either Sick of drinking jack daniels to go to sleep. Tried lidocaine patches- no help. Didn't even help supersensitivity Trigger point injections- didn't help- didn't do anything but pissed him off. Epidural steroid injections x3 in [place]- didn't help. Tried muscle relaxants x4 different types- flexeril, robaxin, Zanaflex, and possibly Baclofen- not helpful Haven't tried Cymbalta/Duloxetine. Norco has helped some/slightly, but burning through medicine.  Never tried Trileptal-    Hx of GI bleed- bleeding from rectum. Didn't go out when sedated for colonoscopy- kicked in after calmed down.    Last MRI of cervical spine and shoulder. IMPRESSION: 1. C4-5 small left foraminal protrusion with mild to moderate stenosis. 2. C6-7 mild right foraminal narrowing. 3. Diffusely patent canal.  IMPRESSION: 1. Asymmetric marrow edema and small erosions of the distal clavicle, suggestive of distal clavicle osteolysis. 2. Faint edema and minimal atrophy of the teres minor muscle likely reflects denervation, as can be seen with quadrilateral space Syndrome.  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   Exam: Awake, alert, appropriate, standing entire visit, NAD RUE- delt 5-/5, bicep 4/5, tricep 5-/5, WE 5-/5, grip 5-/5, finger 5/5 LUE- delt- 4-/5, bicep 4-/5, tricep 4-/5, WE 4/5, grip 4-/5, finger abd 4-/5  LUE exam limited by pain but  feel also has weakness Mild atrophy in infraspinatus, and supraspinatus, and teres major/minor; also has mild bicep atrophy on L side as well Extremely TTP to even light touch over posterior scapula on L  Neuro: Decreased dramatically sensation to light touch C4 to T1 on LUE- RUE is intact Couldn't tolerate checking DTRs.  No hoffman's B/L   Assessment and Plan: 1. 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 Nerve pain- severe in LUE   - will try Duloxetine /Cymbalta- 30 mg daily x 1 week then 60 mg daily.  - Will also call -Neurologist- in [place]- is male- can call me and let me know- to see if can try Trileptal for nerve pain (since on multiple anti-seizure medicines for seizures  - need to get Cervical MRI scheduled to see if exam c/w MRI- might/could need cervical surgery to decompress/fuse- most likely cause of pain- Although al to of pain is in shoulder, it STARTS in L neck, so cervical spine more likely cause of pain than shoulder- unless shoulder is SECOND cause as well.  Of note, had an episode of staring at end of appointment- nonresponsive to all verbal stimuli.  Trying to put his mind somewhere else.     I spent a total of 45 minutes on appointment- more than 25 minutes educating pt on cervical compression/stenosis and nerve pain as a result- concerned about weakness.

## 2019-03-23 ENCOUNTER — Ambulatory Visit: Payer: Medicaid Other | Attending: Podiatry

## 2019-03-28 ENCOUNTER — Telehealth: Payer: Self-pay | Admitting: Physical Therapy

## 2019-03-28 ENCOUNTER — Ambulatory Visit: Payer: Medicaid Other

## 2019-03-28 NOTE — Telephone Encounter (Signed)
Message left as a reminder of his next appointment this Thursday and that he has missed his last 2 appointments. I asked him to call if he was not returning to PT.  The clinic number [phone] was left on the message.

## 2019-03-30 ENCOUNTER — Ambulatory Visit: Payer: Medicaid Other

## 2019-04-04 ENCOUNTER — Encounter: Payer: Medicaid Other | Admitting: Physical Medicine and Rehabilitation

## 2019-04-11 ENCOUNTER — Ambulatory Visit
Admission: RE | Admit: 2019-04-11 | Discharge: 2019-04-11 | Disposition: A | Discharge status: Home / Self Care | Payer: Medicaid Other | Source: Ambulatory Visit | Attending: Physical Medicine and Rehabilitation | Admitting: Physical Medicine and Rehabilitation

## 2019-04-11 DIAGNOSIS — M4712 Other spondylosis with myelopathy, cervical region: Secondary | ICD-10-CM

## 2019-04-11 DIAGNOSIS — S199XXA Unspecified injury of neck, initial encounter: Secondary | ICD-10-CM | POA: Diagnosis not present

## 2019-04-11 MED ORDER — GADOBENATE DIMEGLUMINE 529 MG/ML IV SOLN
15.0000 mL | Freq: Once | INTRAVENOUS | Status: AC | PRN
  Administered 2019-04-11: 15 mL via INTRAVENOUS

## 2019-04-14 ENCOUNTER — Encounter: Payer: Medicaid Other | Admitting: Physical Medicine and Rehabilitation

## 2019-04-24 ENCOUNTER — Telehealth: Payer: Self-pay

## 2019-04-24 NOTE — Telephone Encounter (Signed)
He should get COVID a tested and self quarantine

## 2019-04-24 NOTE — Telephone Encounter (Signed)
Patient called and states he is having sweats, headaches for the past 2 weeks, slight cough off and on. Patient states that he has contacted his PCP and has an evisit with PCP on 04/27/19 but he was not pleased with having an evisit so he contacted [facility]. LPN advised patient with his current s/sx that he is displaying we would also suggest an evisit. LPN also advised patient that he could go to the ED his sign/smptoms did not improve or got worse.  LPN also advised that he could go to either of the community testing sites. At that time patient hung up the phone [facility]

## 2019-04-26 ENCOUNTER — Encounter (HOSPITAL_COMMUNITY): Payer: Self-pay | Admitting: Emergency Medicine

## 2019-04-26 ENCOUNTER — Other Ambulatory Visit: Payer: Self-pay

## 2019-04-26 ENCOUNTER — Ambulatory Visit (HOSPITAL_COMMUNITY)
Admission: EM | Admit: 2019-04-26 | Discharge: 2019-04-26 | Disposition: A | Discharge status: Home / Self Care | Payer: Medicaid Other | Attending: Family Medicine | Admitting: Family Medicine

## 2019-04-26 ENCOUNTER — Ambulatory Visit (INDEPENDENT_AMBULATORY_CARE_PROVIDER_SITE_OTHER): Payer: Medicaid Other

## 2019-04-26 DIAGNOSIS — Z20828 Contact with and (suspected) exposure to other viral communicable diseases: Secondary | ICD-10-CM

## 2019-04-26 DIAGNOSIS — Z20822 Contact with and (suspected) exposure to covid-19: Secondary | ICD-10-CM

## 2019-04-26 DIAGNOSIS — R05 Cough: Secondary | ICD-10-CM | POA: Diagnosis not present

## 2019-04-26 DIAGNOSIS — R059 Cough, unspecified: Secondary | ICD-10-CM

## 2019-04-26 DIAGNOSIS — B349 Viral infection, unspecified: Secondary | ICD-10-CM

## 2019-04-26 DIAGNOSIS — R519 Headache, unspecified: Secondary | ICD-10-CM | POA: Diagnosis not present

## 2019-04-26 DIAGNOSIS — R509 Fever, unspecified: Secondary | ICD-10-CM | POA: Diagnosis not present

## 2019-04-26 MED ORDER — BENZONATATE 200 MG PO CAPS: 200.0000 mg | ORAL_CAPSULE | Freq: Two times a day (BID) | ORAL | 0 refills | Status: DC | PRN

## 2019-04-26 MED ORDER — HYDROCODONE-ACETAMINOPHEN 7.5-325 MG PO TABS: 1.0000 | ORAL_TABLET | Freq: Four times a day (QID) | ORAL | 0 refills | Status: DC | PRN

## 2019-04-26 NOTE — ED Triage Notes (Signed)
Patient just drove back from [place] and arrived back today. patient went to bury his mother last week

## 2019-04-26 NOTE — ED Triage Notes (Signed)
Headache for 3 days.  Patient reports a cough, runny nose, chest and muscle soreness, and sob.  Denies fever

## 2019-04-26 NOTE — Discharge Instructions (Addendum)
Be sure to follow up with your PCP about the diabetes and new medicines Rest and push fluids Even though COVID not likely, you need to stay home until the test is negative.  You can check your result online Take the tessalon for cough Take the hydrocodone for headache Do not drive or work on the hydrocodone Follow up with your usual doctors

## 2019-04-26 NOTE — ED Provider Notes (Signed)
[facility]    CSN: [id number] Arrival date & time: 04/26/19  1034      History   Chief Complaint Chief Complaint  Patient presents with  . Headache    HPI Perry Norton is a 43 y.o. male.   HPI   Patient is here for cough and chest congestion.  Unable to take a deep breath.  Pain in his chest when he takes a deep breath.  He also has runny nose and some sore throat.  No ear pressure pain.  He states he has muscle soreness and fatigue.  Feels short of breath.  He states that he attended a funeral service for his mother several days ago where there was a large number of people.  He states that he tried to wear gloves, mask, and distance himself although there was difficulty.  He does not know if he was exposed to anyone who is sick.  Dr. Algis Liming has recommended that he get a coronavirus test done quarantine himself.  Patient went to work last night, and came here after. While in [place] he went to a medical clinic because he didn't feel well.  His blood sugar was elevated.  The doctor told him he needed insulin. He has HIV.  He is compliant with his medication.  He states he is doing well with low viral count. 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    Past Medical History:  Diagnosis Date  . HIV (human immunodeficiency virus infection) (HCC)   . Seizures (HCC)    epilepsy  . Visual loss 09/07/2018    Patient Active Problem List   Diagnosis Date Noted  . Spondylosis, cervical, with myelopathy 03/21/2019  . Plantar fasciitis 02/12/2019  . Generalized seizure disorder (HCC) 11/15/2018  . Visual loss 09/07/2018  . Diaphoresis 01/19/2018  . ED (erectile dysfunction) 01/19/2018  . Left arm pain 10/18/2017  . Localization-related idiopathic epilepsy and epileptic syndromes with seizures of localized onset, not intractable, without status epilepticus (HCC) 07/22/2017  . Intractable migraine without aura and  without status migrainosus 07/22/2017  . Chronic pain syndrome 07/22/2017  . Pleurisy 07/05/2017  . AC separation, type 2, left, initial encounter 05/25/2017  . CRI (chronic renal insufficiency) 05/20/2017  . Syphilis 05/20/2017  . Poor dentition 05/20/2017  . Head trauma 06/10/2015  . 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 Seizure (HCC) 06/10/2015  . Brachial plexopathy 06/07/2014  . Right arm weakness 03/02/2014  . Disseminated zoster 04/09/2011  . Human immunodeficiency virus (HIV) disease (HCC) 11/06/2010  . Numbness and tingling of right arm 11/06/2010    Past Surgical History:  Procedure Laterality Date  . NO PAST SURGERIES         Home Medications    Prior to Admission medications   Medication Sig Start Date End Date Taking? Authorizing Provider  benzonatate (TESSALON) 200 MG capsule Take 1 capsule (200 mg total) by mouth 2 (two) times daily as needed for cough. 04/26/19   Eustace Moore, MD  bictegravir-emtricitabine-tenofovir AF (BIKTARVY) 50-200-25 MG TABS tablet Take 1 tablet by mouth daily. 05/30/18   Randall Hiss, MD  Cholecalciferol (VITAMIN D-3) 125 MCG (5000 UT) TABS 2 PO qd x 3 months, then 1 PO qd long-term 02/03/19   Hilts, Casimiro Needle, MD  diclofenac sodium (VOLTAREN) 1 % GEL Apply 2 g topically 4 (four) times daily. Rub into affected area of foot 2 to 4 times daily 01/02/19   Vivi Barrack,  DPM  divalproex (DEPAKOTE) 500 MG DR tablet Take 2 tabs in AM, 1/2 tab at noon, 2 tabs in PM 08/15/18   Gildardo Pounds, NP  DULoxetine (CYMBALTA) 30 MG capsule Take 1 capsule (30 mg total) by mouth daily. X 1 week then 2 tab/60 mg daily- for nerve pain 03/21/19 03/20/20  Lovorn, Jinny Blossom, MD  gabapentin (NEURONTIN) 300 MG capsule Take 1 capsule (300 mg total) by mouth 3 (three) times daily. 02/06/19   Hilts, Legrand Como, MD  HYDROcodone-acetaminophen (NORCO) 7.5-325 MG tablet Take 1 tablet by mouth every 6 (six) hours as needed for moderate pain. 04/26/19   Raylene Everts,  MD  lacosamide (VIMPAT) 200 MG TABS tablet Take 1 tablet (200 mg total) by mouth 2 (two) times daily. 07/21/17   Cameron Sprang, MD  losartan (COZAAR) 50 MG tablet Take 1 tablet (50 mg total) by mouth daily. 08/12/18   Charlott Rakes, MD  sildenafil (VIAGRA) 25 MG tablet Take 1 tablet (25 mg total) by mouth daily as needed for erectile dysfunction. 01/19/18   Campbell Riches, MD  topiramate (TOPAMAX) 50 MG tablet Take 1 tablet twice a day 09/03/17   Cameron Sprang, MD  Vitamin D, Ergocalciferol, (DRISDOL) 1.25 MG (50000 UT) CAPS capsule Take 1 capsule (50,000 Units total) by mouth every 7 (seven) days. 05/30/18   Gildardo Pounds, NP    Family History Family History  Problem Relation Age of Onset  . Sarcoidosis Mother   Diabetes - mother, siblings  Social History Social History   Tobacco Use  . Smoking status: Current Some Day Smoker    Packs/day: 1.00    Types: Cigarettes    Start date: 06/22/1994  . Smokeless tobacco: Never Used  Substance Use Topics  . Alcohol use: Yes    Alcohol/week: 3.0 standard drinks    Types: 3 Cans of beer per week    Comment: 1 time a week  . Drug use: Yes    Types: Marijuana    Comment: daily     Allergies   Onion   Review of Systems Review of Systems  Constitutional: Positive for diaphoresis and fatigue. Negative for chills and fever.  HENT: Positive for congestion, postnasal drip and rhinorrhea. Negative for ear pain and sore throat.   Eyes: Negative for pain and visual disturbance.  Respiratory: Positive for cough, chest tightness and shortness of breath.   Cardiovascular: Positive for chest pain. Negative for palpitations.  Gastrointestinal: Negative for abdominal pain and vomiting.  Genitourinary: Negative for dysuria and hematuria.  Musculoskeletal: Positive for myalgias, neck pain and neck stiffness. Negative for arthralgias and back pain.  Skin: Negative for color change and rash.  Neurological: Positive for weakness, numbness and  headaches. Negative for seizures and syncope.  All other systems reviewed and are negative.    Physical Exam Triage Vital Signs ED Triage Vitals  Enc Vitals Group     BP 04/26/19 1053 (!) 121/99     Pulse Rate 04/26/19 1053 (!) 102     Resp 04/26/19 1053 16     Temp 04/26/19 1053 97.8 F (36.6 C)     Temp Source 04/26/19 1053 Temporal     SpO2 04/26/19 1053 99 %     Weight --      Height --      Head Circumference --      Peak Flow --      Pain Score 04/26/19 1120 10     Pain Loc --  Pain Edu? --      Excl. in GC? --    No data found.  Updated Vital Signs BP (!) 121/99 (BP Location: Right Arm)   Pulse (!) 102   Temp 97.8 F (36.6 C) (Temporal)   Resp 16   SpO2 99%      Physical Exam Constitutional:      General: He is not in acute distress.    Appearance: He is well-developed and normal weight. He is ill-appearing.  HENT:     Head: Normocephalic and atraumatic.     Right Ear: There is impacted cerumen.     Left Ear: There is impacted cerumen.     Nose: Nose normal. No congestion.     Mouth/Throat:     Mouth: Mucous membranes are moist.     Pharynx: No posterior oropharyngeal erythema.     Comments: Poor dentition Eyes:     Conjunctiva/sclera: Conjunctivae normal.     Pupils: Pupils are equal, round, and reactive to light.  Neck:     Musculoskeletal: Normal range of motion.  Cardiovascular:     Rate and Rhythm: Normal rate and regular rhythm.     Heart sounds: Normal heart sounds.  Pulmonary:     Effort: Pulmonary effort is normal. No respiratory distress.     Breath sounds: Rales present.     Comments: Faint rales r base Abdominal:     General: There is no distension.     Palpations: Abdomen is soft.  Musculoskeletal: Normal range of motion.     Right lower leg: No edema.     Left lower leg: No edema.  Lymphadenopathy:     Cervical: No cervical adenopathy.  Skin:    General: Skin is warm and dry.  Neurological:     General: No focal deficit  present.     Mental Status: He is alert.  Psychiatric:        Mood and Affect: Mood normal.        Behavior: Behavior normal.     Comments: Appropriate sadness/grief      UC Treatments / Results  Labs (all labs ordered are listed, but only abnormal results are displayed) Labs Reviewed - No data to display  EKG   Radiology Dg Chest 2 View  Result Date: 04/26/2019 CLINICAL DATA:  Cough, fever EXAM: CHEST - 2 VIEW COMPARISON:  04/12/2018 FINDINGS: The heart size and mediastinal contours are within normal limits. Both lungs are clear. The visualized skeletal structures are unremarkable. IMPRESSION: No acute abnormality of the lungs. Electronically Signed   By: Lauralyn PrimesAlex  Bibbey M.D.   On: 04/26/2019 12:06    Procedures Procedures (including critical care time)  Medications Ordered in UC Medications - No data to display  Initial Impression / Assessment and Plan / UC Course  I have reviewed the triage vital signs and the nursing notes.  Pertinent labs & imaging results that were available during my care of the patient were reviewed by me and considered in my medical decision making (see chart for details).     Refused blood sugar CXR normal EKG shows some early LVH but normal rate and rhythm normal intervals, no ST or T wave changes Final Clinical Impressions(s) / UC Diagnoses   Final diagnoses:  Viral illness  Suspected COVID-19 virus infection  Cough  Bad headache     Discharge Instructions     Be sure to follow up with your PCP about the diabetes Rest and push fluids Even though COVID  not likely, you need to stay home until the test is negative.  You can check your result online Take the tessalon for cough Take the hydrocodone for headache Do not drive or work on the hydrocodone Follow up with your usual doctors     ED Prescriptions    Medication Sig Dispense Auth. Provider   benzonatate (TESSALON) 200 MG capsule Take 1 capsule (200 mg total) by mouth 2 (two)  times daily as needed for cough. 20 capsule Eustace Moore, MD   HYDROcodone-acetaminophen [facility]) 7.5-325 MG tablet Take 1 tablet by mouth every 6 (six) hours as needed for moderate pain. 10 tablet Eustace Moore, MD     I have reviewed the PDMP during this encounter.   Eustace Moore, MD 04/26/19 1255

## 2019-04-27 ENCOUNTER — Ambulatory Visit: Payer: Medicaid Other | Attending: Family Medicine | Admitting: Family Medicine

## 2019-04-27 ENCOUNTER — Encounter: Payer: Self-pay | Admitting: Family Medicine

## 2019-04-27 ENCOUNTER — Telehealth: Payer: Self-pay | Admitting: General Practice

## 2019-04-27 DIAGNOSIS — R0789 Other chest pain: Secondary | ICD-10-CM | POA: Diagnosis not present

## 2019-04-27 DIAGNOSIS — R52 Pain, unspecified: Secondary | ICD-10-CM

## 2019-04-27 DIAGNOSIS — R079 Chest pain, unspecified: Secondary | ICD-10-CM

## 2019-04-27 DIAGNOSIS — R05 Cough: Secondary | ICD-10-CM

## 2019-04-27 DIAGNOSIS — Z09 Encounter for follow-up examination after completed treatment for conditions other than malignant neoplasm: Secondary | ICD-10-CM

## 2019-04-27 DIAGNOSIS — M791 Myalgia, unspecified site: Secondary | ICD-10-CM

## 2019-04-27 DIAGNOSIS — R058 Other specified cough: Secondary | ICD-10-CM

## 2019-04-27 DIAGNOSIS — G43809 Other migraine, not intractable, without status migrainosus: Secondary | ICD-10-CM

## 2019-04-27 MED ORDER — AZITHROMYCIN 250 MG PO TABS: ORAL_TABLET | ORAL | 0 refills | Status: DC

## 2019-04-27 MED ORDER — GUAIFENESIN-CODEINE 100-10 MG/5ML PO SOLN: 5.0000 mL | Freq: Four times a day (QID) | ORAL | 0 refills | Status: DC | PRN

## 2019-04-27 NOTE — Progress Notes (Signed)
Virtual Visit via Telephone Note  I connected with Perry Norton on 04/27/19 at  8:50 AM EST by telephone and verified that I am speaking with the correct person using two identifiers.   I discussed the limitations, risks, security and privacy concerns of performing an evaluation and management service by telephone and the availability of in person appointments. I also discussed with the patient that there may be a patient responsible charge related to this service. The patient expressed understanding and agreed to proceed.  Patient Location: Home (presumed) Provider Location: [facility] Others participating in call: call initiated by [place], CMA who then transferred the call to me   History of Present Illness:        43 year old male with complaint of recent onset of cough with production of yellow to white sputum as well as sensation of generalized chest pain, increased migraine headaches as well as body aches.  Patient states that he was seen yesterday at urgent care but nothing was done for him.  Patient states that he feels terrible.  He states that he was tested for coronavirus but does not feel that this is what he has.  He denies dizziness, no fever or chills, no sore throat and no loss of sensation of smell or taste.  He states that he has not tried ibuprofen for body aches/muscle aches and states that he does not take Tylenol. No abdominal pain, no N/V/D.  He does not feel that anyone is listening to the actual symptoms that he is describing.             (Patient additionally had complained that he was not seen in person and tried to discuss with patient that due to the current Covid restrictions that in person visit would jeopardize others due to patient's current respiratory symptoms and additionally patient was seen last night at urgent care with pending Covid results and had examination and chest x-ray done at urgent care. Current office policy does not allow in-person evaluation  with his acute symptoms).    Past Medical History:  Diagnosis Date  . HIV (human immunodeficiency virus infection) ([place])   . Seizures ([place])    epilepsy  . Visual loss 09/07/2018    Past Surgical History:  Procedure Laterality Date  . NO PAST SURGERIES      Family History  Problem Relation Age of Onset  . Sarcoidosis Mother     Social History   Tobacco Use  . Smoking status: Current Some Day Smoker    Packs/day: 1.00    Types: Cigarettes    Start date: 06/22/1994  . Smokeless tobacco: Never Used  Substance Use Topics  . Alcohol use: Yes    Alcohol/week: 3.0 standard drinks    Types: 3 Cans of beer per week    Comment: 1 time a week  . Drug use: Yes    Types: Marijuana    Comment: daily     Allergies  Allergen Reactions  . Onion Rash       Observations/Objective: No vital signs or physical exam conducted as visit was done via telephone  Assessment and Plan: 1. Chest pain, unspecified type; 2. Productive cough 3. Other migraine without status migrainosus, not intractable 4. Generalized body aches 5. Encounter for examination following treatment at hospital Patient status post urgent care visit yesterday 04/26/2019 due to complaint of cough, chest congestion and headache.  Discussed with patient that his chest x-ray was normal at urgent care but sometimes findings on  chest x-ray lag behind actual process therefore would like to place patient on an antibiotic that would cover an atypical pneumonia and as patient reports chest congestion and sensation of shortness of breath and his Covid test is still pending, discussed placing patient on prednisone taper/steroids.  Patient states that he does not wish to take steroids so this medicine does not need to be sent in.  Throughout the conversation, patient continue to use expletives and eventually call may have been disconnected by patient.  We will still send in prescription for azithromycin Z-Pak to cover possible atypical  pneumonia and prescription will be sent to patient's pharmacy for guaifenesin with codeine cough medication to help with chest congestion.  Patient had also been advised to take Tylenol for headache/muscle aches in case he does have COVID-19 as acetaminophen has been found to be more effective for the symptoms.  He was also encouraged prior to disconnecting the call to take over-the-counter zinc and vitamin C to help with immunity.  He was also made aware to go to the emergency department if his symptoms acutely worsen.  Follow Up Instructions:Return for go to ED if any worsening of symptoms.    I discussed the assessment and treatment plan with the patient. The patient was provided an opportunity to ask questions and all were answered. The patient agreed with the plan and demonstrated an understanding of the instructions.   The patient was advised to call back or seek an in-person evaluation if the symptoms worsen or if the condition fails to improve as anticipated.  I provided 11 minutes of non-face-to-face time during this encounter.   Cain Saupe, MD

## 2019-04-27 NOTE — Telephone Encounter (Signed)
Please share what medications are being sent in.. if any.Perry Norton or if you need to speak with patient again prior to filling, due to him hanging up during AM tele-visit.

## 2019-04-27 NOTE — Progress Notes (Signed)
Patient verified DOB Patient has eaten today Patient has not taken medication. Patient complains HA/cough/ pain to the left of his chest. Patient states he is the only one in the house experiencing symptoms. No N/V + diarrhea. Patient does not feel as though he was exposed.

## 2019-04-27 NOTE — Telephone Encounter (Signed)
Informed patient with information and he verbalized understanding.  

## 2019-04-27 NOTE — Telephone Encounter (Signed)
Please notify patient that I am sending in azithromycin Z-Pak which would cover an atypical pneumonia.  I am also sending in a prescription for a cough medicine that has a combination of a mucolytic to help with chest congestion and codeine which can help with cough and chest discomfort.  Patient did not want to have steroids prescribed therefore prescription was not sent to his pharmacy for prednisone or dexamethasone.

## 2019-04-27 NOTE — Telephone Encounter (Signed)
Please share with patient

## 2019-04-27 NOTE — Telephone Encounter (Signed)
Patient called wanting to know what his medications that he was prescribed are for. Please follow up

## 2019-04-28 ENCOUNTER — Telehealth (HOSPITAL_COMMUNITY): Payer: Self-pay | Admitting: Emergency Medicine

## 2019-04-28 LAB — NOVEL CORONAVIRUS, NAA (HOSP ORDER, SEND-OUT TO REF LAB; TAT 18-24 HRS): SARS-CoV-2, NAA: NOT DETECTED

## 2019-04-28 NOTE — Telephone Encounter (Signed)
After further discussion with patient, pt states he is feeling worse, its difficult to breathe, pt is whispering on the phone. Pt also c/o worsening chest tightness. Pt instructed that he needs to be evaluated in the ER due to decline in symptoms. Pt stated he needs to go to work due to unpaid bills. Pt encouraged to take care of himself as well, and if he continues to further decline, he must be seen otherwise complications can occur up to and including death. Pt offered an extra day off of work to help him rest, pt refused. Verbalized understanding that he will go to the ER if it gets "too bad".

## 2019-04-28 NOTE — Telephone Encounter (Signed)
Pt called requesting covid test results. Results reported as negative.  

## 2019-05-01 ENCOUNTER — Ambulatory Visit (HOSPITAL_COMMUNITY)
Admission: EM | Admit: 2019-05-01 | Discharge: 2019-05-01 | Disposition: A | Discharge status: Home / Self Care | Payer: Medicaid Other | Attending: Family Medicine | Admitting: Family Medicine

## 2019-05-01 ENCOUNTER — Encounter (HOSPITAL_COMMUNITY): Payer: Self-pay

## 2019-05-01 ENCOUNTER — Telehealth: Payer: Self-pay | Admitting: Emergency Medicine

## 2019-05-01 ENCOUNTER — Ambulatory Visit (INDEPENDENT_AMBULATORY_CARE_PROVIDER_SITE_OTHER): Payer: Medicaid Other

## 2019-05-01 ENCOUNTER — Encounter
Payer: Medicaid Other | Attending: Physical Medicine and Rehabilitation | Admitting: Physical Medicine and Rehabilitation

## 2019-05-01 ENCOUNTER — Telehealth: Payer: Self-pay

## 2019-05-01 ENCOUNTER — Other Ambulatory Visit: Payer: Self-pay

## 2019-05-01 ENCOUNTER — Encounter: Payer: Self-pay | Admitting: Physical Medicine and Rehabilitation

## 2019-05-01 VITALS — BP 102/76 | HR 98 | Temp 97.7°F | Ht 72.0 in | Wt 159.8 lb

## 2019-05-01 DIAGNOSIS — S8992XA Unspecified injury of left lower leg, initial encounter: Secondary | ICD-10-CM

## 2019-05-01 DIAGNOSIS — G540 Brachial plexus disorders: Secondary | ICD-10-CM | POA: Insufficient documentation

## 2019-05-01 DIAGNOSIS — M4800 Spinal stenosis, site unspecified: Secondary | ICD-10-CM | POA: Diagnosis not present

## 2019-05-01 DIAGNOSIS — Z5321 Procedure and treatment not carried out due to patient leaving prior to being seen by health care provider: Secondary | ICD-10-CM | POA: Insufficient documentation

## 2019-05-01 DIAGNOSIS — M4712 Other spondylosis with myelopathy, cervical region: Secondary | ICD-10-CM

## 2019-05-01 DIAGNOSIS — M25562 Pain in left knee: Secondary | ICD-10-CM | POA: Diagnosis present

## 2019-05-01 DIAGNOSIS — M79602 Pain in left arm: Secondary | ICD-10-CM | POA: Diagnosis not present

## 2019-05-01 DIAGNOSIS — G9520 Unspecified cord compression: Secondary | ICD-10-CM | POA: Diagnosis not present

## 2019-05-01 HISTORY — DX: Spinal stenosis, site unspecified: M48.00

## 2019-05-01 MED ORDER — ACETAMINOPHEN 500 MG PO TABS: 1000.0000 mg | ORAL_TABLET | Freq: Four times a day (QID) | ORAL | 0 refills | Status: DC | PRN

## 2019-05-01 MED ORDER — DICLOFENAC SODIUM 1 % TD GEL: 2.0000 g | Freq: Four times a day (QID) | TRANSDERMAL | 0 refills | Status: DC

## 2019-05-01 NOTE — Patient Instructions (Signed)
1. L C5/6 disc protrusion with mass effect on ventral sac- with neuropathic pain, and myelopathy  2. Nerve pain 3. Myelopathy- with associated neck, muscle pain and headaches. - will refer to [facility] ASAP
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
## 2019-05-01 NOTE — Telephone Encounter (Signed)
Attempted to call patient. Left voicemail to call Dr. Derek Mound office.   Patient had evisit with PCP on 04/27/19 ([facility]) and was prescribed  -guaiFENesin-codeine 100-10 MG/5ML syrup  For Cough -azithromycin (ZITHROMAX) 250 MG tablet for 4 days for URI.  Routing to Dr. [name] to make aware.  Perry Norton

## 2019-05-01 NOTE — Telephone Encounter (Signed)
Pt called requesting note for work. Note created in patients chart, can print when he arrives to pick it up.
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
## 2019-05-01 NOTE — Discharge Instructions (Addendum)
Xray is reassuring today.  Ice, elevation, use of ACE wrap to help with pain.  Apply votaren gel 4 times a day to help with pain and swelling.  I do recommend following up with either sports medicine or orthopedics for recheck if symptoms persist.  Please go to the ER if pain worsens, develop increased swelling or warmth, or if you develop any numbness or tingling to lower leg

## 2019-05-01 NOTE — Telephone Encounter (Signed)
-----   Message from Truman Hayward, MD sent at 04/27/2019  9:56 AM EST ----- Thanks, you will get test back +/-. We can certainly set up e visit w him as well or check in on him w RN staff. Can you let me know if he is + ----- Message ----- From: Raylene Everts, MD Sent: 04/26/2019  12:57 PM EST To: Truman Hayward, MD  [facility]

## 2019-05-01 NOTE — Progress Notes (Signed)
Subjective:    Patient ID: Perry Norton, male    DOB: 11-01-1975, 43 y.o.   MRN: [id number]  HPI  CC_ L shoulder/neck pain Pain meds were like M&Ms-   No improvement with meds/ has had for years- doesn't want to try Cervical steroid injections- wants to go for surgery, which I think is appropriate at this time, since failed pretty much every treatment including steroid injections in the neck without significant improvement.   Pain Inventory Average Pain 10 Pain Right Now 10 My pain is sharp, burning, dull, stabbing, tingling and aching  In the last 24 hours, has pain interfered with the following? General activity 10 Relation with others 10 Enjoyment of life 10 What TIME of day is your pain at its worst? all Sleep (in general) Poor  Pain is worse with: nothing Pain improves with: nothing Relief from Meds: na  Mobility Do you have any goals in this area?  no  Function employed # of hrs/week .  Neuro/Psych No problems in this area  Prior Studies x-rays CT/MRI  Physicians involved in your care Any changes since last visit?  no   Family History  Problem Relation Age of Onset  . Sarcoidosis Mother    Social History   Socioeconomic History  . Marital status: Married    Spouse name: Not on file  . Number of children: Not on file  . Years of education: Not on file  . Highest education level: Not on file  Occupational History  . Not on file  Social Needs  . Financial resource strain: Not on file  . Food insecurity    Worry: Not on file    Inability: Not on file  . Transportation needs    Medical: Not on file    Non-medical: Not on file  Tobacco Use  . Smoking status: Current Some Day Smoker    Packs/day: 1.00    Types: Cigarettes    Start date: 06/22/1994  . Smokeless tobacco: Never Used  Substance and Sexual Activity  . Alcohol use: Yes    Alcohol/week: 3.0 standard drinks    Types: 3 Cans of beer per week    Comment: 1 time a week  . Drug use: Yes     Types: Marijuana    Comment: daily  . Sexual activity: Yes  Lifestyle  . Physical activity    Days per week: Not on file    Minutes per session: Not on file  . Stress: Not on file  Relationships  . Social Herbalist on phone: Not on file    Gets together: Not on file    Attends religious service: Not on file    Active member of club or organization: Not on file    Attends meetings of clubs or organizations: Not on file    Relationship status: Not on file  Other Topics Concern  . Not on file  Social History Narrative  . Not on file   Past Surgical History:  Procedure Laterality Date  . NO PAST SURGERIES     Past Medical History:  Diagnosis Date  . HIV (human immunodeficiency virus infection) ([place])   . Seizures ([place])    epilepsy  . Visual loss 09/07/2018   BP 102/76   Pulse 98   Temp 97.7 F (36.5 C)   Ht 6' (1.829 m)   Wt 159 lb 12.8 oz (72.5 kg)   SpO2 92%   BMI 21.67 kg/m   Opioid Risk  Score:   Fall Risk Score:  `1  Depression screen PHQ 2/9  Depression screen [facility] 2/9 11/10/2017 10/11/2017 09/03/2017 08/20/2017 05/20/2017 05/17/2017  Decreased Interest 0 3 2 2  0 2  Down, Depressed, Hopeless 0 1 0 0 0 2  PHQ - 2 Score 0 4 2 2  0 4  Altered sleeping - 3 2 2  - 3  Tired, decreased energy - 3 2 2  - 2  Change in appetite - 2 2 2  - 2  Feeling bad or failure about yourself  - 0 1 0 - 2  Trouble concentrating - 2 1 0 - 2  Moving slowly or fidgety/restless - 2 2 2  - 1  Suicidal thoughts - 0 0 0 - 2  PHQ-9 Score - 16 12 10  - 18    Review of Systems  Constitutional: Positive for diaphoresis.  All other systems reviewed and are negative.      Objective:   Physical Exam Awake, alert, appropriate, standing, No acute distress LUE- affected by pain delt 2+/5, bicep 3-/5, tricep 3-/5, WE 3/5, grip 3+/5, finger abd 2/5- VERY difficult to comply with exam due to supersensitivity and pain.      Assessment & Plan:   1. L C5/6 disc protrusion with mass  effect on ventral sac- with neuropathic pain, and myelopathy  2. Nerve pain 3. Myelopathy- with associated neck, muscle pain and headaches. - will refer to [facility] ASAP  I spent a total of 20 minute on appointment- more than 10 minutes explaining and showing pt on model what's going on.

## 2019-05-01 NOTE — Telephone Encounter (Signed)
Covid test was negative but if he is still symptomatic he should still quarantine for now

## 2019-05-02 ENCOUNTER — Ambulatory Visit: Payer: Medicaid Other | Attending: Family Medicine | Admitting: Family Medicine

## 2019-05-02 ENCOUNTER — Encounter (HOSPITAL_COMMUNITY): Payer: Self-pay

## 2019-05-02 ENCOUNTER — Encounter: Payer: Self-pay | Admitting: Family Medicine

## 2019-05-02 ENCOUNTER — Other Ambulatory Visit: Payer: Self-pay

## 2019-05-02 ENCOUNTER — Emergency Department (HOSPITAL_COMMUNITY)
Admission: EM | Admit: 2019-05-02 | Discharge: 2019-05-02 | Disposition: A | Discharge status: Home / Self Care | Payer: Medicaid Other | Attending: Emergency Medicine | Admitting: Emergency Medicine

## 2019-05-02 VITALS — Ht 72.0 in | Wt 155.8 lb

## 2019-05-02 DIAGNOSIS — R079 Chest pain, unspecified: Secondary | ICD-10-CM

## 2019-05-02 DIAGNOSIS — S8002XA Contusion of left knee, initial encounter: Secondary | ICD-10-CM | POA: Diagnosis not present

## 2019-05-02 MED ORDER — OXYCODONE-ACETAMINOPHEN 5-325 MG PO TABS
1.0000 | ORAL_TABLET | ORAL | Status: DC | PRN
  Administered 2019-05-02: 1 via ORAL
  Filled 2019-05-02: qty 1

## 2019-05-02 NOTE — Progress Notes (Signed)
Patient is having chest pains and SOB.

## 2019-05-02 NOTE — Progress Notes (Signed)
43 year old male here for an office visit. On walking into the room and introducing myself and asked how he was doing and he declined to answer but remained standing. On asking him to please sit he hesitated for a couple of minutes then sat on the edge of the exam table. I asked about his concerns today but he just lifted up his index finger and proceeded to walk out of the room. I walked behind him assuming he needed to go to the bathroom and asked if he needed assistance and he just walked through the lobby and out of the clinic.
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
## 2019-05-18 ENCOUNTER — Other Ambulatory Visit: Payer: Self-pay | Admitting: Infectious Disease

## 2019-05-18 DIAGNOSIS — B20 Human immunodeficiency virus [HIV] disease: Secondary | ICD-10-CM

## 2019-05-24 ENCOUNTER — Encounter (HOSPITAL_COMMUNITY): Payer: Self-pay | Admitting: Emergency Medicine

## 2019-05-24 ENCOUNTER — Emergency Department (HOSPITAL_COMMUNITY)
Admission: EM | Admit: 2019-05-24 | Discharge: 2019-05-24 | Discharge status: Against Medical Advice | Payer: Medicaid Other | Attending: Emergency Medicine | Admitting: Emergency Medicine

## 2019-05-24 ENCOUNTER — Emergency Department (HOSPITAL_COMMUNITY)
Admission: EM | Admit: 2019-05-24 | Discharge: 2019-05-24 | Discharge status: Against Medical Advice | Payer: Medicaid Other | Source: Home / Self Care | Attending: Emergency Medicine | Admitting: Emergency Medicine

## 2019-05-24 ENCOUNTER — Other Ambulatory Visit: Payer: Self-pay

## 2019-05-24 ENCOUNTER — Emergency Department (HOSPITAL_COMMUNITY): Payer: Medicaid Other

## 2019-05-24 DIAGNOSIS — R55 Syncope and collapse: Secondary | ICD-10-CM

## 2019-05-24 DIAGNOSIS — R0789 Other chest pain: Secondary | ICD-10-CM | POA: Insufficient documentation

## 2019-05-24 DIAGNOSIS — Z5320 Procedure and treatment not carried out because of patient's decision for unspecified reasons: Secondary | ICD-10-CM | POA: Insufficient documentation

## 2019-05-24 DIAGNOSIS — Z720 Tobacco use: Secondary | ICD-10-CM | POA: Insufficient documentation

## 2019-05-24 DIAGNOSIS — R079 Chest pain, unspecified: Secondary | ICD-10-CM

## 2019-05-24 DIAGNOSIS — B20 Human immunodeficiency virus [HIV] disease: Secondary | ICD-10-CM | POA: Insufficient documentation

## 2019-05-24 DIAGNOSIS — Z5321 Procedure and treatment not carried out due to patient leaving prior to being seen by health care provider: Secondary | ICD-10-CM | POA: Diagnosis not present

## 2019-05-24 DIAGNOSIS — Z79899 Other long term (current) drug therapy: Secondary | ICD-10-CM | POA: Insufficient documentation

## 2019-05-24 LAB — TROPONIN I (HIGH SENSITIVITY)
Troponin I (High Sensitivity): 3 ng/L (ref <18)
Troponin I (High Sensitivity): 2 ng/L (ref <18)

## 2019-05-24 LAB — BASIC METABOLIC PANEL
Anion gap: 9 (ref 5–15)
Anion gap: 11 (ref 5–15)
BUN: 11 mg/dL (ref 6–20)
BUN: 9 mg/dL (ref 6–20)
CO2: 28 mmol/L (ref 22–32)
CO2: 24 mmol/L (ref 22–32)
Calcium: 9.4 mg/dL (ref 8.9–10.3)
Calcium: 9.6 mg/dL (ref 8.9–10.3)
Chloride: 102 mmol/L (ref 98–111)
Chloride: 102 mmol/L (ref 98–111)
Creatinine, Ser: 1.4 mg/dL — ABNORMAL HIGH (ref 0.61–1.24)
Creatinine, Ser: 1.3 mg/dL — ABNORMAL HIGH (ref 0.61–1.24)
GFR calc Af Amer: >60 mL/min (ref >60)
GFR calc Af Amer: >60 mL/min (ref >60)
GFR calc non Af Amer: >60 mL/min (ref >60)
GFR calc non Af Amer: >60 mL/min (ref >60)
Glucose, Bld: 127 mg/dL — ABNORMAL HIGH (ref 70–99)
Glucose, Bld: 120 mg/dL — ABNORMAL HIGH (ref 70–99)
Potassium: 4 mmol/L (ref 3.5–5.1)
Potassium: 4.3 mmol/L (ref 3.5–5.1)
Sodium: 139 mmol/L (ref 135–145)
Sodium: 137 mmol/L (ref 135–145)

## 2019-05-24 LAB — CBC WITH DIFFERENTIAL/PLATELET
Abs Immature Granulocytes: 0.01 10*3/uL (ref 0.00–0.07)
Basophils Absolute: 0.1 10*3/uL (ref 0.0–0.1)
Basophils Relative: 1 %
Eosinophils Absolute: 0.1 10*3/uL (ref 0.0–0.5)
Eosinophils Relative: 1 %
HCT: 46.8 % (ref 39.0–52.0)
Hemoglobin: 14.9 g/dL (ref 13.0–17.0)
Immature Granulocytes: 0 %
Lymphocytes Relative: 27 %
Lymphs Abs: 1.6 10*3/uL (ref 0.7–4.0)
MCH: 29.3 pg (ref 26.0–34.0)
MCHC: 31.8 g/dL (ref 30.0–36.0)
MCV: 92.1 fL (ref 80.0–100.0)
Monocytes Absolute: 0.4 10*3/uL (ref 0.1–1.0)
Monocytes Relative: 6 %
Neutro Abs: 3.9 10*3/uL (ref 1.7–7.7)
Neutrophils Relative %: 65 %
Platelets: 211 10*3/uL (ref 150–400)
RBC: 5.08 MIL/uL (ref 4.22–5.81)
RDW: 13.3 % (ref 11.5–15.5)
WBC: 6 10*3/uL (ref 4.0–10.5)
nRBC: 0 % (ref 0.0–0.2)

## 2019-05-24 LAB — CBC
HCT: 43.5 % (ref 39.0–52.0)
Hemoglobin: 14.3 g/dL (ref 13.0–17.0)
MCH: 29.8 pg (ref 26.0–34.0)
MCHC: 32.9 g/dL (ref 30.0–36.0)
MCV: 90.6 fL (ref 80.0–100.0)
Platelets: 200 10*3/uL (ref 150–400)
RBC: 4.8 MIL/uL (ref 4.22–5.81)
RDW: 13.2 % (ref 11.5–15.5)
WBC: 5.3 10*3/uL (ref 4.0–10.5)
nRBC: 0 % (ref 0.0–0.2)

## 2019-05-24 LAB — D-DIMER, QUANTITATIVE: D-Dimer, Quant: <0.27 ug/mL-FEU (ref 0.00–0.50)

## 2019-05-24 MED ORDER — KETOROLAC TROMETHAMINE 30 MG/ML IJ SOLN: 30.0000 mg | Freq: Once | INTRAMUSCULAR | Status: DC

## 2019-05-24 MED ORDER — SODIUM CHLORIDE 0.9% FLUSH: 3.0000 mL | Freq: Once | INTRAVENOUS | Status: DC

## 2019-05-24 NOTE — ED Notes (Signed)
Pt leaving AMA. 

## 2019-05-24 NOTE — ED Notes (Signed)
Pt refused blood draw for second troponin.

## 2019-05-24 NOTE — ED Notes (Signed)
Notified EDP that pt did not want to have CT and asked her to speak with him.

## 2019-05-24 NOTE — ED Triage Notes (Addendum)
Pt states he is here to get lab results.  States he came this morning and left prior to getting results.  Not wanting to answer any questions or have anything done at this time.  Denies pain.  Reports mild SOB.  Woke up with CP during the night and went to bathroom and had syncopal episode.

## 2019-05-24 NOTE — ED Triage Notes (Signed)
Pt from home stated he was woken from right sided chest pain.  He remembers getting up to use the bathroom and says he woke up on the floor.  Pt is alert and oriented at this time.

## 2019-05-24 NOTE — Discharge Instructions (Signed)
You have left AGAINST MEDICAL ADVICE because you are treatment is not complete.  Follow-up with your primary care doctor.  Return the emergency department for any worsening chest pain, difficulty breathing or any other worsening concerning symptoms.

## 2019-05-24 NOTE — ED Provider Notes (Signed)
Perry Norton [facility] EMERGENCY DEPARTMENT Provider Note   CSN: [id number] Arrival date & time: 05/24/19  5465     History   Chief Complaint Chief Complaint  Patient presents with   Shortness of Breath    HPI Perry Norton is a 43 y.o. male possible history of HIV, seizures who presents for evaluation of chest pain and syncopal episode.  He reports at about 1015 last night, he started developing chest pain.  He describes it as a "stabbing type pain."  He states that the pain continued.  He did not have any associated nausea/vomiting.  He denies any associated diaphoresis.  He does report that he had some mild shortness of breath associated with symptoms.  Patient states that this morning at 5 AM, he woke up and was laying on the floor.  He thinks he may have had a syncopal episode but is unclear.  He states he does not recall what happened.  Patient states he came to the emergency department earlier this morning and he got blood work drawn but he did not want to stay and wait and so he left.  He comes back in today because he states he wanted to know the results and states he is still having some chest pain.  He does report it is improved from last night but he states he is still having it.  He states he does smoke cigarettes.  He states that the number of cigarettes changes daily.  He denies any other drug use.  He denies any testosterone use, history of PE/DVT, history of leg swelling, recent surgery or hospitalization.  Patient denies any personal cardiac history or family history of MI before the age of 45.     The history is provided by the patient.    Past Medical History:  Diagnosis Date   HIV (human immunodeficiency virus infection) (HCC)    Seizures (HCC)    epilepsy   Visual loss 09/07/2018    Patient Active Problem List   Diagnosis Date Noted   Nerve pain due to spinal stenosis 05/01/2019   Spinal cord compression (HCC) 03/21/2019   Plantar fasciitis  02/12/2019   Generalized seizure disorder (HCC) 11/15/2018   Visual loss 09/07/2018   Diaphoresis 01/19/2018   ED (erectile dysfunction) 01/19/2018   Left arm pain 10/18/2017   Localization-related idiopathic epilepsy and epileptic syndromes with seizures of localized onset, not intractable, without status epilepticus (HCC) 07/22/2017   Intractable migraine without aura and without status migrainosus 07/22/2017   Chronic pain syndrome 07/22/2017   Pleurisy 07/05/2017   AC separation, type 2, left, initial encounter 05/25/2017   CRI (chronic renal insufficiency) 05/20/2017   Syphilis 05/20/2017   Poor dentition 05/20/2017   Head trauma 06/10/2015   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 06/10/2015   Seizure (HCC) 06/10/2015   Brachial plexopathy 06/07/2014   Right arm weakness 03/02/2014   Disseminated zoster 04/09/2011   Human immunodeficiency virus (HIV) disease (HCC) 11/06/2010   Numbness and tingling of right arm 11/06/2010    Past Surgical History:  Procedure Laterality Date   NO PAST SURGERIES          Home Medications    Prior to Admission medications   Medication Sig Start Date End Date Taking? Authorizing Provider  acetaminophen (TYLENOL) 500 MG tablet Take 2 tablets (1,000 mg total) by mouth every 6 (six) hours as needed for mild pain or moderate pain. 05/01/19   Georgetta Haber, NP  azithromycin (ZITHROMAX) 250 MG tablet Take  2 pills on the first day then 1 pill daily for 4 days Patient not taking: Reported on 05/02/2019 04/27/19   Fulp, Cammie, MD  benzonatate (TESSALON) 200 MG capsule Take 1 capsule (200 mg total) by mouth 2 (two) times daily as needed for cough. Patient not taking: Reported on 04/27/2019 04/26/19   Eustace Moore, MD  BIKTARVY 50-200-25 MG TABS tablet TAKE 1 TABLET BY MOUTH DAILY 05/22/19   Daiva Eves, Lisette Grinder, MD  Cholecalciferol (VITAMIN D-3) 125 MCG (5000 UT) TABS 2 PO qd x 3 months, then 1 PO qd long-term 02/03/19   Hilts, Casimiro Needle, MD    diclofenac sodium (VOLTAREN) 1 % GEL Apply 2 g topically 4 (four) times daily. Rub into affected area of foot 2 to 4 times daily 01/02/19   Vivi Barrack, DPM  diclofenac sodium (VOLTAREN) 1 % GEL Apply 2 g topically 4 (four) times daily. 05/01/19   Georgetta Haber, NP  divalproex (DEPAKOTE) 500 MG DR tablet Take 2 tabs in AM, 1/2 tab at noon, 2 tabs in PM 08/15/18   Claiborne Rigg, NP  DULoxetine (CYMBALTA) 30 MG capsule Take 1 capsule (30 mg total) by mouth daily. X 1 week then 2 tab/60 mg daily- for nerve pain 03/21/19 03/20/20  Lovorn, Aundra Millet, MD  gabapentin (NEURONTIN) 300 MG capsule Take 1 capsule (300 mg total) by mouth 3 (three) times daily. 02/06/19   Hilts, Casimiro Needle, MD  guaiFENesin-codeine 100-10 MG/5ML syrup Take 5 mLs by mouth every 6 (six) hours as needed for cough. Jonni Sanger congestion Patient not taking: Reported on 05/02/2019 04/27/19   Fulp, Cammie, MD  HYDROcodone-acetaminophen (NORCO) 7.5-325 MG tablet Take 1 tablet by mouth every 6 (six) hours as needed for moderate pain. Patient not taking: Reported on 04/27/2019 04/26/19   Eustace Moore, MD  lacosamide (VIMPAT) 200 MG TABS tablet Take 1 tablet (200 mg total) by mouth 2 (two) times daily. 07/21/17   Van Clines, MD  losartan (COZAAR) 50 MG tablet Take 1 tablet (50 mg total) by mouth daily. 08/12/18   Hoy Register, MD  sildenafil (VIAGRA) 25 MG tablet Take 1 tablet (25 mg total) by mouth daily as needed for erectile dysfunction. Patient not taking: Reported on 04/27/2019 01/19/18   Ginnie Smart, MD  topiramate (TOPAMAX) 50 MG tablet Take 1 tablet twice a day 09/03/17   Van Clines, MD  Vitamin D, Ergocalciferol, (DRISDOL) 1.25 MG (50000 UT) CAPS capsule Take 1 capsule (50,000 Units total) by mouth every 7 (seven) days. 05/30/18   Claiborne Rigg, NP    Family History Family History  Problem Relation Age of Onset   Sarcoidosis Mother     Social History Social History   Tobacco Use   Smoking status:  Current Some Day Smoker    Packs/day: 1.00    Types: Cigarettes    Start date: 06/22/1994   Smokeless tobacco: Never Used  Substance Use Topics   Alcohol use: Yes    Alcohol/week: 3.0 standard drinks    Types: 3 Cans of beer per week    Comment: 1 time a week   Drug use: Yes    Types: Marijuana    Comment: daily     Allergies   Onion   Review of Systems Review of Systems  Constitutional: Negative for fever.  Respiratory: Positive for shortness of breath. Negative for cough.   Cardiovascular: Positive for chest pain.  Gastrointestinal: Negative for abdominal pain, nausea and vomiting.  Genitourinary: Negative for  dysuria and hematuria.  Neurological: Positive for syncope. Negative for headaches.  All other systems reviewed and are negative.    Physical Exam Updated Vital Signs BP (!) 155/123 (BP Location: Left Arm)    Pulse 78    Temp 97.8 F (36.6 C) (Oral)    Resp 17    SpO2 100%   Physical Exam Vitals signs and nursing note reviewed.  Constitutional:      Appearance: Normal appearance. He is well-developed.     Comments: Patient is very short with answers and does not provide a lot of details.  HENT:     Head: Normocephalic and atraumatic.  Eyes:     General: Lids are normal.     Conjunctiva/sclera: Conjunctivae normal.     Pupils: Pupils are equal, round, and reactive to light.  Neck:     Musculoskeletal: Full passive range of motion without pain.  Cardiovascular:     Rate and Rhythm: Normal rate and regular rhythm.     Pulses: Normal pulses.          Radial pulses are 2+ on the right side and 2+ on the left side.     Heart sounds: Normal heart sounds. No murmur. No friction rub. No gallop.   Pulmonary:     Effort: Pulmonary effort is normal.     Breath sounds: Normal breath sounds.     Comments: Follows commands, Moves all extremities  5/5 strength to BUE and BLE  Sensation intact throughout all major nerve distributions Abdominal:     Palpations:  Abdomen is soft. Abdomen is not rigid.     Tenderness: There is no abdominal tenderness. There is no guarding.  Musculoskeletal: Normal range of motion.  Skin:    General: Skin is warm and dry.     Capillary Refill: Capillary refill takes less than 2 seconds.  Neurological:     Mental Status: He is alert and oriented to person, place, and time.     Comments: Follows commands, Moves all extremities  5/5 strength to BUE and BLE  Sensation intact throughout all major nerve distributions  Psychiatric:        Speech: Speech normal.      ED Treatments / Results  Labs (all labs ordered are listed, but only abnormal results are displayed) Labs Reviewed  CBC WITH DIFFERENTIAL/PLATELET  D-DIMER, QUANTITATIVE ([facility])  BASIC METABOLIC PANEL  TROPONIN I (HIGH SENSITIVITY)  TROPONIN I (HIGH SENSITIVITY)    EKG None  Radiology Dg Chest 2 View  Result Date: 05/24/2019 CLINICAL DATA:  Chest pain EXAM: CHEST - 2 VIEW COMPARISON:  April 26, 2019 FINDINGS: Lungs clear. Heart size and pulmonary vascularity are normal. No adenopathy. No pneumothorax. No bone lesions. IMPRESSION: No edema or consolidation. Electronically Signed   By: Bretta BangWilliam  Woodruff III M.D.   On: 05/24/2019 06:56    Procedures Procedures (including critical care time)  Medications Ordered in ED Medications - No data to display   Initial Impression / Assessment and Plan / ED Course  I have reviewed the triage vital signs and the nursing notes.  Pertinent labs & imaging results that were available during my care of the patient were reviewed by me and considered in my medical decision making (see chart for details).        43 year old male who presents for evaluation of chest pain, shortness of breath that began last night.  He reports he may have had a syncopal episode.  He states he woke up  about 5 AM this morning and was face down on the floor.  He is unsure of exactly what happened.  He came earlier today  and got blood work done but did not want a wait and so he left. He came back because he stated that he continued to have some pain. Patient is afebrile, non-toxic appearing, sitting comfortably on examination table. Vital signs reviewed and stable.  He does have any tachycardia or hypoxia.  He has no PE risk factors but given questionable syncope, will plan for dimer.  Low suspicion for ACS etiology but consideration.  History/physical exam is not concerning for dissection.   Dimer is negative.  CBC without any significant leukocytosis or anemia.  Patient refused CT head.  I discussed with him that given that he had fall with loss of consciousness, will need to obtain CT head for further evaluation.  Patient states that he did not wish 1.  He expressed understanding of risk versus benefits of declining and wished not to have it at this time.  RN informing that patient wanted to leave.  I discussed with patient that his work-up was not complete as we are still pending his troponin.  Patient states that he wanted to leave and did not wish to wait to stay for further treatment and evaluation.  I informed patient that he will be leaving [place].  Patient expressed understanding and agreement.  He appears clinically sober and exhibits full medical decision-making capacity.  Patient will be discharged AMA.  Portions of this note were generated with Lobbyist. Dictation errors may occur despite best attempts at proofreading.   Final Clinical Impressions(s) / ED Diagnoses   Final diagnoses:  Chest pain, unspecified type  Syncope, unspecified syncope type    ED Discharge Orders    None       Desma Mcgregor 05/24/19 1416    Carmin Muskrat, MD 05/26/19 1020

## 2019-05-24 NOTE — ED Notes (Signed)
Notified EDP that pt wants to leave.

## 2019-05-25 DIAGNOSIS — S8002XD Contusion of left knee, subsequent encounter: Secondary | ICD-10-CM | POA: Diagnosis not present

## 2019-06-01 DIAGNOSIS — M542 Cervicalgia: Secondary | ICD-10-CM | POA: Diagnosis not present

## 2019-06-01 DIAGNOSIS — I10 Essential (primary) hypertension: Secondary | ICD-10-CM | POA: Diagnosis not present

## 2019-06-11 ENCOUNTER — Other Ambulatory Visit: Payer: Self-pay | Admitting: Infectious Disease

## 2019-06-11 DIAGNOSIS — B20 Human immunodeficiency virus [HIV] disease: Secondary | ICD-10-CM

## 2019-06-12 ENCOUNTER — Telehealth: Payer: Self-pay | Admitting: *Deleted

## 2019-06-12 DIAGNOSIS — M25562 Pain in left knee: Secondary | ICD-10-CM | POA: Diagnosis not present

## 2019-06-12 NOTE — Telephone Encounter (Signed)
Patient called, states that [facility] needs a new prescription. He says he has been without medication for 3 weeks.  RN sent electronic prescription 11/20 for #30 with 3 refills.  Per pharmacy, patient picked up biktarvy on 11/1 and 11/28. They will correct the number of refills now, will be ready for pick up today. RN called patient to let him know it would be ready today, left message. Landis Gandy, RN

## 2019-06-21 NOTE — Addendum Note (Signed)
Addended by: Iveliz Garay D on: 06/21/2019 04:17 PM   Modules accepted: Orders  

## 2019-06-21 NOTE — Addendum Note (Signed)
Addended by: Manika Hast D on: 06/21/2019 04:17 PM   Modules accepted: Orders  

## 2019-06-23 DIAGNOSIS — S060XAA Concussion with loss of consciousness status unknown, initial encounter: Secondary | ICD-10-CM

## 2019-06-23 DIAGNOSIS — S060X9A Concussion with loss of consciousness of unspecified duration, initial encounter: Secondary | ICD-10-CM

## 2019-06-23 HISTORY — DX: Concussion with loss of consciousness status unknown, initial encounter: S06.0XAA

## 2019-06-23 HISTORY — DX: Concussion with loss of consciousness of unspecified duration, initial encounter: S06.0X9A

## 2019-06-27 ENCOUNTER — Other Ambulatory Visit: Payer: Medicaid Other

## 2019-06-27 ENCOUNTER — Other Ambulatory Visit: Payer: Self-pay

## 2019-06-27 DIAGNOSIS — B20 Human immunodeficiency virus [HIV] disease: Secondary | ICD-10-CM

## 2019-06-27 DIAGNOSIS — A539 Syphilis, unspecified: Secondary | ICD-10-CM

## 2019-06-27 DIAGNOSIS — R569 Unspecified convulsions: Secondary | ICD-10-CM

## 2019-06-28 ENCOUNTER — Other Ambulatory Visit: Payer: Medicaid Other

## 2019-06-28 LAB — T-HELPER CELL (CD4) - (RCID CLINIC ONLY)
CD4 % Helper T Cell: 28 % — ABNORMAL LOW (ref 33–65)
CD4 T Cell Abs: 673 /uL (ref 400–1790)

## 2019-06-30 LAB — COMPLETE METABOLIC PANEL WITH GFR
AG Ratio: 2 (calc) (ref 1.0–2.5)
ALT: 17 U/L (ref 9–46)
AST: 20 U/L (ref 10–40)
Albumin: 4.3 g/dL (ref 3.6–5.1)
Alkaline phosphatase (APISO): 70 U/L (ref 36–130)
BUN/Creatinine Ratio: 9 (calc) (ref 6–22)
BUN: 13 mg/dL (ref 7–25)
CO2: 31 mmol/L (ref 20–32)
Calcium: 9.2 mg/dL (ref 8.6–10.3)
Chloride: 105 mmol/L (ref 98–110)
Creat: 1.4 mg/dL — ABNORMAL HIGH (ref 0.60–1.35)
GFR, Est African American: 72 mL/min/{1.73_m2} (ref >=60)
GFR, Est Non African American: 62 mL/min/{1.73_m2} (ref >=60)
Globulin: 2.1 g/dL (calc) (ref 1.9–3.7)
Glucose, Bld: 88 mg/dL (ref 65–99)
Potassium: 3.8 mmol/L (ref 3.5–5.3)
Sodium: 142 mmol/L (ref 135–146)
Total Bilirubin: 0.4 mg/dL (ref 0.2–1.2)
Total Protein: 6.4 g/dL (ref 6.1–8.1)

## 2019-06-30 LAB — CBC WITH DIFFERENTIAL/PLATELET
Absolute Monocytes: 634 cells/uL (ref 200–950)
Basophils Absolute: 51 cells/uL (ref 0–200)
Basophils Relative: 0.8 %
Eosinophils Absolute: 102 cells/uL (ref 15–500)
Eosinophils Relative: 1.6 %
HCT: 41.6 % (ref 38.5–50.0)
Hemoglobin: 13.9 g/dL (ref 13.2–17.1)
Lymphs Abs: 2355 cells/uL (ref 850–3900)
MCH: 29.7 pg (ref 27.0–33.0)
MCHC: 33.4 g/dL (ref 32.0–36.0)
MCV: 88.9 fL (ref 80.0–100.0)
MPV: 11.3 fL (ref 7.5–12.5)
Monocytes Relative: 9.9 %
Neutro Abs: 3258 cells/uL (ref 1500–7800)
Neutrophils Relative %: 50.9 %
Platelets: 191 10*3/uL (ref 140–400)
RBC: 4.68 10*6/uL (ref 4.20–5.80)
RDW: 13.3 % (ref 11.0–15.0)
Total Lymphocyte: 36.8 %
WBC: 6.4 10*3/uL (ref 3.8–10.8)

## 2019-06-30 LAB — RPR TITER: RPR Titer: 1:16 {titer} — ABNORMAL HIGH

## 2019-06-30 LAB — FLUORESCENT TREPONEMAL AB(FTA)-IGG-BLD: Fluorescent Treponemal ABS: REACTIVE — AB

## 2019-06-30 LAB — HIV-1 RNA QUANT-NO REFLEX-BLD
HIV 1 RNA Quant: 62 copies/mL — ABNORMAL HIGH
HIV-1 RNA Quant, Log: 1.79 Log copies/mL — ABNORMAL HIGH

## 2019-06-30 LAB — RPR: RPR Ser Ql: REACTIVE — AB

## 2019-07-11 ENCOUNTER — Telehealth: Payer: Self-pay

## 2019-07-11 NOTE — Telephone Encounter (Signed)
COVID-19 Pre-Screening Questions:07/11/19 Do you currently have a fever (>100 F), chills or unexplained body aches? NO   Are you currently experiencing new cough, shortness of breath, sore throat, runny nose?NO .  Have you recently travelled outside the state of [place] in the last 14 days? NO .  Have you been in contact with someone that is currently pending confirmation of Covid19 testing or has been confirmed to have the Covid19 virus? NO  **If the patient answers NO to ALL questions -  advise the patient to please call the clinic before coming to the office should any symptoms develop.     

## 2019-07-12 ENCOUNTER — Encounter: Payer: Self-pay | Admitting: Infectious Disease

## 2019-07-12 ENCOUNTER — Other Ambulatory Visit: Payer: Self-pay

## 2019-07-12 ENCOUNTER — Ambulatory Visit: Payer: Medicaid Other | Admitting: Infectious Disease

## 2019-07-12 VITALS — BP 119/87 | HR 143 | Temp 97.9°F | Ht 72.0 in | Wt 158.0 lb

## 2019-07-12 DIAGNOSIS — A539 Syphilis, unspecified: Secondary | ICD-10-CM

## 2019-07-12 DIAGNOSIS — B20 Human immunodeficiency virus [HIV] disease: Secondary | ICD-10-CM

## 2019-07-12 DIAGNOSIS — R569 Unspecified convulsions: Secondary | ICD-10-CM

## 2019-07-12 DIAGNOSIS — F1290 Cannabis use, unspecified, uncomplicated: Secondary | ICD-10-CM | POA: Insufficient documentation

## 2019-07-12 DIAGNOSIS — Z23 Encounter for immunization: Secondary | ICD-10-CM | POA: Diagnosis not present

## 2019-07-12 DIAGNOSIS — N182 Chronic kidney disease, stage 2 (mild): Secondary | ICD-10-CM | POA: Diagnosis not present

## 2019-07-12 HISTORY — DX: Cannabis use, unspecified, uncomplicated: F12.90

## 2019-07-12 MED ORDER — BIKTARVY 50-200-25 MG PO TABS: 1.0000 | ORAL_TABLET | Freq: Every day | ORAL | 11 refills | Status: DC

## 2019-07-12 NOTE — Addendum Note (Signed)
Addended by: Shelly Bombard on: 07/12/2019 11:09 AM   Modules accepted: Orders

## 2019-07-12 NOTE — Progress Notes (Signed)
Subjective:   Chief complaint: Seizures with some injuries  2 days ago    Patient ID: Perry Norton, male    DOB: 1976-03-10, 44 y.o.   MRN: [id number]  HPI  This 44 year old African-American man with HIV that has been well controlled on Biktarvy.  He does have a seizure disorder and is followed by neurologist in [facility].  Apparently in the last 2 days has had several additional seizures and is resumed smoking marijuana to try to help control them.  He states that he was prescribed marijuana for this while living in [place].     Past Medical History:  Diagnosis Date  . HIV (human immunodeficiency virus infection) (HCC)   . Seizures (HCC)    epilepsy  . Visual loss 09/07/2018    Past Surgical History:  Procedure Laterality Date  . NO PAST SURGERIES      Family History  Problem Relation Age of Onset  . Sarcoidosis Mother       Social History   Socioeconomic History  . Marital status: Married    Spouse name: Not on file  . Number of children: Not on file  . Years of education: Not on file  . Highest education level: Not on file  Occupational History  . Not on file  Tobacco Use  . Smoking status: Current Some Day Smoker    Packs/day: 1.00    Types: Cigarettes    Start date: 06/22/1994  . Smokeless tobacco: Never Used  Substance and Sexual Activity  . Alcohol use: Yes    Alcohol/week: 3.0 standard drinks    Types: 3 Cans of beer per week    Comment: 1 time a week  . Drug use: Yes    Types: Marijuana    Comment: daily  . Sexual activity: Yes  Other Topics Concern  . Not on file  Social History Narrative  . Not on file   Social Determinants of Health   Financial Resource Strain:   . Difficulty of Paying Living Expenses: Not on file  Food Insecurity:   . Worried About Programme researcher, broadcasting/film/video in the Last Year: Not on file  . Ran Out of Food in the Last Year: Not on file  Transportation Needs:   . Lack of Transportation (Medical): Not on file  . Lack  of Transportation (Non-Medical): Not on file  Physical Activity:   . Days of Exercise per Week: Not on file  . Minutes of Exercise per Session: Not on file  Stress:   . Feeling of Stress : Not on file  Social Connections:   . Frequency of Communication with Friends and Family: Not on file  . Frequency of Social Gatherings with Friends and Family: Not on file  . Attends Religious Services: Not on file  . Active Member of Clubs or Organizations: Not on file  . Attends Banker Meetings: Not on file  . Marital Status: Not on file    Allergies  Allergen Reactions  . Onion Rash     Current Outpatient Medications:  .  acetaminophen (TYLENOL) 500 MG tablet, Take 2 tablets (1,000 mg total) by mouth every 6 (six) hours as needed for mild pain or moderate pain., Disp: 90 tablet, Rfl: 0 .  azithromycin (ZITHROMAX) 250 MG tablet, Take 2 pills on the first day then 1 pill daily for 4 days (Patient not taking: Reported on 05/02/2019), Disp: 6 tablet, Rfl: 0 .  benzonatate (TESSALON) 200 MG capsule, Take  1 capsule (200 mg total) by mouth 2 (two) times daily as needed for cough. (Patient not taking: Reported on 04/27/2019), Disp: 20 capsule, Rfl: 0 .  BIKTARVY 50-200-25 MG TABS tablet, TAKE 1 TABLET BY MOUTH DAILY, Disp: 30 tablet, Rfl: 3 .  Cholecalciferol (VITAMIN D-3) 125 MCG (5000 UT) TABS, 2 PO qd x 3 months, then 1 PO qd long-term, Disp: 180 tablet, Rfl: 3 .  diclofenac sodium (VOLTAREN) 1 % GEL, Apply 2 g topically 4 (four) times daily. Rub into affected area of foot 2 to 4 times daily, Disp: 100 g, Rfl: 2 .  diclofenac sodium (VOLTAREN) 1 % GEL, Apply 2 g topically 4 (four) times daily., Disp: 350 g, Rfl: 0 .  divalproex (DEPAKOTE) 500 MG DR tablet, Take 2 tabs in AM, 1/2 tab at noon, 2 tabs in PM, Disp: 135 tablet, Rfl: 1 .  DULoxetine (CYMBALTA) 30 MG capsule, Take 1 capsule (30 mg total) by mouth daily. X 1 week then 2 tab/60 mg daily- for nerve pain, Disp: 60 capsule, Rfl: 5 .   gabapentin (NEURONTIN) 300 MG capsule, Take 1 capsule (300 mg total) by mouth 3 (three) times daily., Disp: 90 capsule, Rfl: 3 .  guaiFENesin-codeine 100-10 MG/5ML syrup, Take 5 mLs by mouth every 6 (six) hours as needed for cough. Vonna Kotyk congestion (Patient not taking: Reported on 05/02/2019), Disp: 120 mL, Rfl: 0 .  HYDROcodone-acetaminophen (NORCO) 7.5-325 MG tablet, Take 1 tablet by mouth every 6 (six) hours as needed for moderate pain. (Patient not taking: Reported on 04/27/2019), Disp: 10 tablet, Rfl: 0 .  lacosamide (VIMPAT) 200 MG TABS tablet, Take 1 tablet (200 mg total) by mouth 2 (two) times daily., Disp: 60 tablet, Rfl: 5 .  losartan (COZAAR) 50 MG tablet, Take 1 tablet (50 mg total) by mouth daily., Disp: 30 tablet, Rfl: 1 .  sildenafil (VIAGRA) 25 MG tablet, Take 1 tablet (25 mg total) by mouth daily as needed for erectile dysfunction. (Patient not taking: Reported on 04/27/2019), Disp: 10 tablet, Rfl: 2 .  topiramate (TOPAMAX) 50 MG tablet, Take 1 tablet twice a day, Disp: 120 tablet, Rfl: 6 .  Vitamin D, Ergocalciferol, (DRISDOL) 1.25 MG (50000 UT) CAPS capsule, Take 1 capsule (50,000 Units total) by mouth every 7 (seven) days., Disp: 16 capsule, Rfl: 0    Review of Systems  Constitutional: Negative for activity change, appetite change, chills, diaphoresis, fatigue, fever and unexpected weight change.  HENT: Negative for congestion, rhinorrhea, sinus pressure, sneezing, sore throat and trouble swallowing.   Eyes: Negative for photophobia.  Respiratory: Negative for cough, chest tightness, shortness of breath, wheezing and stridor.   Cardiovascular: Negative for chest pain, palpitations and leg swelling.  Gastrointestinal: Negative for abdominal distention, abdominal pain, anal bleeding, blood in stool, constipation, diarrhea, nausea and vomiting.  Genitourinary: Negative for difficulty urinating, dysuria, flank pain and hematuria.  Musculoskeletal: Negative for arthralgias, back  pain, gait problem, joint swelling and myalgias.  Skin: Positive for wound. Negative for color change, pallor and rash.  Neurological: Positive for seizures. Negative for dizziness, tremors, weakness and light-headedness.  Hematological: Negative for adenopathy. Does not bruise/bleed easily.  Psychiatric/Behavioral: Negative for agitation, behavioral problems, confusion, decreased concentration, dysphoric mood and sleep disturbance.       Objective:   Physical Exam Constitutional:      General: He is not in acute distress.    Appearance: Normal appearance. He is well-developed. He is not ill-appearing or diaphoretic.  HENT:     Head: Normocephalic and  atraumatic.     Right Ear: Hearing and external ear normal.     Left Ear: Hearing and external ear normal.     Nose: No nasal deformity or rhinorrhea.  Eyes:     General: No scleral icterus.    Conjunctiva/sclera: Conjunctivae normal.     Right eye: Right conjunctiva is not injected.     Left eye: Left conjunctiva is not injected.     Pupils: Pupils are equal, round, and reactive to light.  Neck:     Vascular: No JVD.  Cardiovascular:     Rate and Rhythm: Normal rate.     Heart sounds: S1 normal and S2 normal.  Pulmonary:     Effort: Pulmonary effort is normal. No respiratory distress.  Abdominal:     General: There is no distension.     Palpations: Abdomen is soft.  Musculoskeletal:        General: Normal range of motion.     Right shoulder: Normal.     Left shoulder: Normal.     Cervical back: Normal range of motion and neck supple.     Right hip: Normal.     Left hip: Normal.     Right knee: Normal.     Left knee: Normal.  Lymphadenopathy:     Head:     Right side of head: No submandibular, preauricular or posterior auricular adenopathy.     Left side of head: No submandibular, preauricular or posterior auricular adenopathy.     Cervical: No cervical adenopathy.     Right cervical: No superficial or deep cervical  adenopathy.    Left cervical: No superficial or deep cervical adenopathy.  Skin:    General: Skin is warm and dry.     Coloration: Skin is not pale.     Findings: No abrasion, bruising, ecchymosis, erythema, lesion or rash.     Nails: There is no clubbing.  Neurological:     Mental Status: He is alert and oriented to person, place, and time.     Sensory: No sensory deficit.     Coordination: Coordination normal.     Gait: Gait normal.  Psychiatric:        Attention and Perception: He is attentive.        Mood and Affect: Mood normal.        Speech: Speech normal.        Behavior: Behavior normal. Behavior is cooperative.        Thought Content: Thought content normal.        Cognition and Memory: Cognition and memory normal.        Judgment: Judgment normal.           Assessment & Plan:   HIV disease: Continue with Biktarvy return to clinic in 6 months time for repeat labs  Seizure disorder continue current medications, I want Marchelle Folks to help with seizure control but again if we cannot prescribe that [place] asked him to follow-up with neurology here in [facility].   Syphilis: Since the titer came to 116 and remains at this state  Marijuana use: I do not really have a problem with it as he is does not get into legal problems.  The other issue which he brought up at the end of the interview was that he is going to a job interview and is believes he will be drug tested.

## 2019-07-13 ENCOUNTER — Telehealth: Payer: Self-pay

## 2019-07-13 NOTE — Telephone Encounter (Signed)
Patient called complaining of fevers, nausea and vomiting and feeling unwell. Patient received flu and pneumonia vaccine yesterday with Dr. Daiva Eves. Apologized that the patient was feeling poorly, but educated him that this is common with vaccines as it is your body responding to an unknown agent. Told him that he should be feeling better in a few days. Instructed him to take tylenol to reduce his fever, and drink plenty of fluids to ensure he doesn't become dehydrated. Patient stated that he does not take tylenol. I told him that unfortunately all that he can do right now is symptom management until he starts feeling better in a few days. Patient wasn't satisfied with my suggestions. Patient asked if he would feel better by Friday (tomorrow) to which I replied that I wasn't sure. Apologized again that he wasn't feeling well but to stay hydrated and get rest, patient stated that he wasn't ever getting vaccines again and hung up.   Nyssa Sayegh Loyola Mast, RN

## 2019-07-25 ENCOUNTER — Ambulatory Visit: Payer: Medicaid Other | Attending: Internal Medicine

## 2019-07-25 ENCOUNTER — Telehealth: Payer: Self-pay

## 2019-07-25 DIAGNOSIS — Z20822 Contact with and (suspected) exposure to covid-19: Secondary | ICD-10-CM

## 2019-07-25 NOTE — Telephone Encounter (Signed)
Patient called requesting information related to Covid-19 testing. Provided patient information by texting "COVID" to [phone]. Patient paused for a brief moment and then states I has not feeling well ever since "I got those Damn Vaccines" States he is still having some chills and OTC medication is not helping. Advised patient to get tested and if he feels like he is getting worse than to go to ED/Urgent care. Valarie Cones

## 2019-07-26 LAB — NOVEL CORONAVIRUS, NAA: SARS-CoV-2, NAA: NOT DETECTED

## 2019-08-23 ENCOUNTER — Other Ambulatory Visit: Payer: Self-pay

## 2019-08-23 ENCOUNTER — Ambulatory Visit: Payer: Medicaid Other | Attending: Family Medicine | Admitting: Physician Assistant

## 2019-08-23 DIAGNOSIS — R509 Fever, unspecified: Secondary | ICD-10-CM

## 2019-08-23 DIAGNOSIS — R569 Unspecified convulsions: Secondary | ICD-10-CM

## 2019-08-23 DIAGNOSIS — B20 Human immunodeficiency virus [HIV] disease: Secondary | ICD-10-CM

## 2019-08-23 DIAGNOSIS — R110 Nausea: Secondary | ICD-10-CM | POA: Diagnosis not present

## 2019-08-23 MED ORDER — ONDANSETRON HCL 8 MG PO TABS: 8.0000 mg | ORAL_TABLET | Freq: Three times a day (TID) | ORAL | 0 refills | Status: DC | PRN

## 2019-08-23 NOTE — Addendum Note (Signed)
Addended byMemory Dance on: 08/23/2019 03:10 PM   Modules accepted: Orders

## 2019-08-23 NOTE — Progress Notes (Signed)
Virtual Visit via Telephone Note  I connected with [name] on 08/23/19 at 11:10 AM EST by telephone and verified that I am speaking with the correct person using two identifiers.   I discussed the limitations, risks, security and privacy concerns of performing an evaluation and management service by telephone and the availability of in person appointments. I also discussed with the patient that there may be a patient responsible charge related to this service. The patient expressed understanding and agreed to proceed.  PATIENT visit by telephone virtually in the context of Covid-19 pandemic. Patient location:  home My Location:  [facility] office Persons on the call:  Me and the patient  History of Present Illness: Fevers on and off "sweating like a Hebrew slave building the pyramids", body aches, some BRBPR.  Some seizures(known epileptic).  Says fevers have been up to 103, 104, 107, 110 degrees.  Fevers occur every other day.  He says this all started after having pneumonia and flu vaccine back in January at the ID clinic.   No cough.  No weight loss.  Some vomiting/spitting up his food.  No abdominal pain.   Observations/Objective:  NAD.  Defensive answers to questions.  Expresses frustration at us/the medical system in general and his ID doctor  Assessment and Plan:   1. Fever, unspecified fever cause Advised checking temps more closely as these numbers seem inaccurate.  To ED if worsens - CBC with Differential/Platelet; Future - Thyroid Panel With TSH; Future  2. Nausea - Comprehensive metabolic panel; Future - ondansetron (ZOFRAN) 8 MG tablet; Take 1 tablet (8 mg total) by mouth every 8 (eight) hours as needed for nausea or vomiting.  Dispense: 20 tablet; Refill: 0  3. Seizure [facility]) Keep f/up appt in April or call for earlier appt with neurology  4. Human immunodeficiency virus (HIV) disease (HCC) Keep f/up with ID.     Follow Up Instructions: See PCP in 2 weeks;  To ED if  worsens   I discussed the assessment and treatment plan with the patient. The patient was provided an opportunity to ask questions and all were answered. The patient agreed with the plan and demonstrated an understanding of the instructions.   The patient was advised to call back or seek an in-person evaluation if the symptoms worsen or if the condition fails to improve as anticipated.  I provided 14 minutes of non-face-to-face time during this encounter.   Perry Co, PA-C  Patient ID: Perry Norton, male   DOB: 1976/04/16, 44 y.o.   MRN: [id number]

## 2019-08-23 NOTE — Progress Notes (Signed)
DOB and Name verified Call transferred to [facility] / Pt did not want to be thoroughly screen/ Made provider aware

## 2019-08-24 ENCOUNTER — Telehealth (INDEPENDENT_AMBULATORY_CARE_PROVIDER_SITE_OTHER): Payer: Self-pay

## 2019-08-24 LAB — CBC WITH DIFFERENTIAL/PLATELET
Basophils Absolute: 0.1 10*3/uL (ref 0.0–0.2)
Basos: 1 %
EOS (ABSOLUTE): 0.1 10*3/uL (ref 0.0–0.4)
Eos: 1 %
Hematocrit: 44.4 % (ref 37.5–51.0)
Hemoglobin: 15 g/dL (ref 13.0–17.7)
Immature Grans (Abs): 0 10*3/uL (ref 0.0–0.1)
Immature Granulocytes: 0 %
Lymphocytes Absolute: 2.2 10*3/uL (ref 0.7–3.1)
Lymphs: 40 %
MCH: 30.4 pg (ref 26.6–33.0)
MCHC: 33.8 g/dL (ref 31.5–35.7)
MCV: 90 fL (ref 79–97)
Monocytes Absolute: 0.5 10*3/uL (ref 0.1–0.9)
Monocytes: 9 %
Neutrophils Absolute: 2.7 10*3/uL (ref 1.4–7.0)
Neutrophils: 49 %
Platelets: 216 10*3/uL (ref 150–450)
RBC: 4.93 x10E6/uL (ref 4.14–5.80)
RDW: 13.9 % (ref 11.6–15.4)
WBC: 5.5 10*3/uL (ref 3.4–10.8)

## 2019-08-24 LAB — COMPREHENSIVE METABOLIC PANEL
ALT: 16 IU/L (ref 0–44)
AST: 22 IU/L (ref 0–40)
Albumin/Globulin Ratio: 2 (ref 1.2–2.2)
Albumin: 4.7 g/dL (ref 4.0–5.0)
Alkaline Phosphatase: 88 IU/L (ref 39–117)
BUN/Creatinine Ratio: 9 (ref 9–20)
BUN: 12 mg/dL (ref 6–24)
Bilirubin Total: 0.3 mg/dL (ref 0.0–1.2)
CO2: 26 mmol/L (ref 20–29)
Calcium: 9.9 mg/dL (ref 8.7–10.2)
Chloride: 102 mmol/L (ref 96–106)
Creatinine, Ser: 1.37 mg/dL — ABNORMAL HIGH (ref 0.76–1.27)
GFR calc Af Amer: 74 mL/min/{1.73_m2} (ref >59)
GFR calc non Af Amer: 64 mL/min/{1.73_m2} (ref >59)
Globulin, Total: 2.3 g/dL (ref 1.5–4.5)
Glucose: 106 mg/dL — ABNORMAL HIGH (ref 65–99)
Potassium: 3.9 mmol/L (ref 3.5–5.2)
Sodium: 143 mmol/L (ref 134–144)
Total Protein: 7 g/dL (ref 6.0–8.5)

## 2019-08-24 LAB — THYROID PANEL WITH TSH
Free Thyroxine Index: 1.9 (ref 1.2–4.9)
T3 Uptake Ratio: 28 % (ref 24–39)
T4, Total: 6.8 ug/dL (ref 4.5–12.0)
TSH: 1.51 u[IU]/mL (ref 0.450–4.500)

## 2019-08-24 NOTE — Telephone Encounter (Signed)
-----   Message from Anders Simmonds, [place] sent at 08/24/2019  8:31 AM EST ----- Please call patient. All of his labs are normal overall.  Drink more water bc kidneys look a little dehydrated.  Thyroid is normal.  Liver function, blood count, and electrolytes are normal and do not explain his symptoms.  Follow-up as planned with PCP in 2 weeks and follow-up with ID.  If his symptoms are severe, he should go to the ED.  Thanks, Georgian Co, PA-C

## 2019-08-24 NOTE — Telephone Encounter (Signed)
Patient verified date of birth. He is aware that labs are normal. He was advised to drink more water as kidneys look dehydrated. Advised to call [facility] to schedule PCP appointment with PCP. He was also advised to follow up with ID. He verbalized understanding. Maryjean Morn, CMA

## 2019-09-08 ENCOUNTER — Ambulatory Visit: Payer: Medicaid Other | Admitting: Nurse Practitioner

## 2019-09-15 ENCOUNTER — Other Ambulatory Visit: Payer: Self-pay

## 2019-09-15 ENCOUNTER — Encounter: Payer: Self-pay | Admitting: Internal Medicine

## 2019-09-15 ENCOUNTER — Ambulatory Visit: Payer: Medicaid Other | Attending: Internal Medicine | Admitting: Internal Medicine

## 2019-09-15 ENCOUNTER — Telehealth: Payer: Self-pay | Admitting: General Practice

## 2019-09-15 VITALS — BP 144/88 | HR 97 | Temp 98.2°F | Resp 16 | Ht 72.0 in | Wt 169.2 lb

## 2019-09-15 DIAGNOSIS — Z79899 Other long term (current) drug therapy: Secondary | ICD-10-CM | POA: Diagnosis not present

## 2019-09-15 DIAGNOSIS — R509 Fever, unspecified: Secondary | ICD-10-CM | POA: Insufficient documentation

## 2019-09-15 DIAGNOSIS — G894 Chronic pain syndrome: Secondary | ICD-10-CM | POA: Insufficient documentation

## 2019-09-15 DIAGNOSIS — Z21 Asymptomatic human immunodeficiency virus [HIV] infection status: Secondary | ICD-10-CM | POA: Insufficient documentation

## 2019-09-15 DIAGNOSIS — G40309 Generalized idiopathic epilepsy and epileptic syndromes, not intractable, without status epilepticus: Secondary | ICD-10-CM | POA: Insufficient documentation

## 2019-09-15 DIAGNOSIS — F17210 Nicotine dependence, cigarettes, uncomplicated: Secondary | ICD-10-CM | POA: Insufficient documentation

## 2019-09-15 DIAGNOSIS — H9192 Unspecified hearing loss, left ear: Secondary | ICD-10-CM | POA: Insufficient documentation

## 2019-09-15 DIAGNOSIS — R197 Diarrhea, unspecified: Secondary | ICD-10-CM

## 2019-09-15 NOTE — Telephone Encounter (Signed)
None of out providers want to see this pt anymore due to attitude

## 2019-09-15 NOTE — Progress Notes (Signed)
Patient ID: Perry Norton, male    DOB: [birth year]  MRN: [id number]  CC: re-establish   Subjective: Perry Norton is a 44 y.o. male who presents for visit today. Previous PCPs at our facility have been NP Geryl Rankins and Dr. Chapman Fitch. His concerns today include:  Patient with history of HIV, marijuana use, seizure disorder, tobacco dependence, chronic pain syndrome  When I walked into the room, I introduced myself and confirmed that he was [employer]. I told him that I understand he wanted to change PCP. Patient was reluctant to answer any questions initially but eventually said "that's fine." I inquired about what his concerns are today. He tells me that he had blood test done earlier this month and came to get the results of those. He also states that he is still not feeling well after receiving the flu vaccine back in January of this year. I see that he had a telephone visit with PA Mcclung on 08/23/2019 where he complained of sweating, body aches and high fevers up to 110 degrees. She did blood tests including chemistry which came back okay except for stable elevation in creatinine, normal CBC, normal thyroid panel. Still sweating "like a hebrew slave and still regurgitating." .  Still fever; yesterday temp was 100.  Endorses +diarrhea QOD. Loose. No abdominal pain No swollen lymph node No sore throat, cough   Patient Active Problem List   Diagnosis Date Noted  . Marijuana use 07/12/2019  . Nerve pain due to spinal stenosis 05/01/2019  . Spinal cord compression ([place]) 03/21/2019  . Plantar fasciitis 02/12/2019  . Generalized seizure disorder ([place]) 11/15/2018  . Visual loss 09/07/2018  . Diaphoresis 01/19/2018  . ED (erectile dysfunction) 01/19/2018  . Left arm pain 10/18/2017  . Localization-related idiopathic epilepsy and epileptic syndromes with seizures of localized onset, not intractable, without status epilepticus ([place]) 07/22/2017  . Intractable migraine without aura and without  status migrainosus 07/22/2017  . Chronic pain syndrome 07/22/2017  . Pleurisy 07/05/2017  . AC separation, type 2, left, initial encounter 05/25/2017  . CRI (chronic renal insufficiency) 05/20/2017  . Syphilis 05/20/2017  . Poor dentition 05/20/2017  . Head trauma 06/10/2015  . 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 Seizure ([place]) 06/10/2015  . Brachial plexopathy 06/07/2014  . Right arm weakness 03/02/2014  . Disseminated zoster 04/09/2011  . Human immunodeficiency virus (HIV) disease ([place]) 11/06/2010  . Numbness and tingling of right arm 11/06/2010     Current Outpatient Medications on File Prior to Visit  Medication Sig Dispense Refill  . bictegravir-emtricitabine-tenofovir AF (BIKTARVY) 50-200-25 MG TABS tablet Take 1 tablet by mouth daily. 30 tablet 11  . Cholecalciferol (VITAMIN D-3) 125 MCG (5000 UT) TABS 2 PO qd x 3 months, then 1 PO qd long-term 180 tablet 3  . divalproex (DEPAKOTE) 500 MG DR tablet Take 2 tabs in AM, 1/2 tab at noon, 2 tabs in PM 135 tablet 1  . lacosamide (VIMPAT) 200 MG TABS tablet Take 1 tablet (200 mg total) by mouth 2 (two) times daily. 60 tablet 5  . acetaminophen (TYLENOL) 500 MG tablet Take 2 tablets (1,000 mg total) by mouth every 6 (six) hours as needed for mild pain or moderate pain. (Patient not taking: Reported on 08/23/2019) 90 tablet 0  . benzonatate (TESSALON) 200 MG capsule Take 1 capsule (200 mg total) by mouth 2 (two) times daily as needed for cough. (Patient not taking: Reported on 04/27/2019) 20 capsule 0  . diclofenac sodium (VOLTAREN) 1 %  GEL Apply 2 g topically 4 (four) times daily. Rub into affected area of foot 2 to 4 times daily (Patient not taking: Reported on 08/23/2019) 100 g 2  . diclofenac sodium (VOLTAREN) 1 % GEL Apply 2 g topically 4 (four) times daily. (Patient not taking: Reported on 09/15/2019) 350 g 0  . DULoxetine (CYMBALTA) 30 MG capsule Take 1 capsule (30 mg total) by mouth daily. X 1 week then 2 tab/60 mg daily- for nerve pain  (Patient not taking: Reported on 08/23/2019) 60 capsule 5  . gabapentin (NEURONTIN) 300 MG capsule Take 1 capsule (300 mg total) by mouth 3 (three) times daily. (Patient not taking: Reported on 08/23/2019) 90 capsule 3  . guaiFENesin-codeine 100-10 MG/5ML syrup Take 5 mLs by mouth every 6 (six) hours as needed for cough. Jonni Sanger congestion (Patient not taking: Reported on 05/02/2019) 120 mL 0  . HYDROcodone-acetaminophen (NORCO) 7.5-325 MG tablet Take 1 tablet by mouth every 6 (six) hours as needed for moderate pain. (Patient not taking: Reported on 04/27/2019) 10 tablet 0  . losartan (COZAAR) 50 MG tablet Take 1 tablet (50 mg total) by mouth daily. (Patient not taking: Reported on 08/23/2019) 30 tablet 1  . ondansetron (ZOFRAN) 8 MG tablet Take 1 tablet (8 mg total) by mouth every 8 (eight) hours as needed for nausea or vomiting. (Patient not taking: Reported on 09/15/2019) 20 tablet 0  . sildenafil (VIAGRA) 25 MG tablet Take 1 tablet (25 mg total) by mouth daily as needed for erectile dysfunction. (Patient not taking: Reported on 04/27/2019) 10 tablet 2  . topiramate (TOPAMAX) 50 MG tablet Take 1 tablet twice a day (Patient not taking: Reported on 08/23/2019) 120 tablet 6  . Vitamin D, Ergocalciferol, (DRISDOL) 1.25 MG (50000 UT) CAPS capsule Take 1 capsule (50,000 Units total) by mouth every 7 (seven) days. (Patient not taking: Reported on 08/23/2019) 16 capsule 0   No current facility-administered medications on file prior to visit.    Allergies  Allergen Reactions  . Onion Rash    Social History   Socioeconomic History  . Marital status: Married    Spouse name: Not on file  . Number of children: Not on file  . Years of education: Not on file  . Highest education level: Not on file  Occupational History  . Not on file  Tobacco Use  . Smoking status: Current Some Day Smoker    Packs/day: 1.00    Types: Cigarettes    Start date: 06/22/1994  . Smokeless tobacco: Never Used  Substance and Sexual  Activity  . Alcohol use: Yes    Alcohol/week: 3.0 standard drinks    Types: 3 Cans of beer per week    Comment: 1 time a week  . Drug use: Yes    Types: Marijuana    Comment: daily  . Sexual activity: Yes  Other Topics Concern  . Not on file  Social History Narrative  . Not on file   Social Determinants of Health   Financial Resource Strain:   . Difficulty of Paying Living Expenses:   Food Insecurity:   . Worried About Programme researcher, broadcasting/film/video in the Last Year:   . Barista in the Last Year:   Transportation Needs:   . Freight forwarder (Medical):   Marland Kitchen Lack of Transportation (Non-Medical):   Physical Activity:   . Days of Exercise per Week:   . Minutes of Exercise per Session:   Stress:   . Feeling of Stress :  Social Connections:   . Frequency of Communication with Friends and Family:   . Frequency of Social Gatherings with Friends and Family:   . Attends Religious Services:   . Active Member of Clubs or Organizations:   . Attends Banker Meetings:   Marland Kitchen Marital Status:   Intimate Partner Violence:   . Fear of Current or Ex-Partner:   . Emotionally Abused:   Marland Kitchen Physically Abused:   . Sexually Abused:     Family History  Problem Relation Age of Onset  . Sarcoidosis Mother     Past Surgical History:  Procedure Laterality Date  . NO PAST SURGERIES      ROS: Review of Systems Negative except as stated above  PHYSICAL EXAM: BP (!) 144/88   Pulse 97   Temp 98.2 F (36.8 C)   Resp 16   Ht 6' (1.829 m)   Wt 169 lb 3.2 oz (76.7 kg)   SpO2 96%   BMI 22.95 kg/m   Wt Readings from Last 3 Encounters:  09/15/19 169 lb 3.2 oz (76.7 kg)  07/12/19 158 lb (71.7 kg)  05/02/19 155 lb 12.8 oz (70.7 kg)    Physical Exam  General appearance - alert, well appearing, middle-aged African-American male and in no distress. When asked to get onto the exam table to be examined. Patient stood by the table. I told him that it would be better for him to  sit on the exam table for me to examine him. He reluctantly obliged. Mental status -patient very withdrawn. Poor eye contact. He was using expletives when he initially started talking to me about the side effects from the flu vaccine. I kindly asked him to not use the expletives. Nose - normal and patent, no erythema, discharge or polyps Mouth - mucous membranes moist, pharynx normal without lesions. Poor oral hygiene. Neck -no cervical lymphadenopathy  lymphatics -no axillary lymphadenopathy  chest -difficult to appreciate whether breath sounds are clear as patient was not taking a deep breath Heart - normal rate, regular rhythm, normal S1, S2, no murmurs, rubs, clicks or gallops Abdomen - soft, nontender, nondistended, no masses or organomegaly  CMP Latest Ref Rng & Units 08/23/2019 06/27/2019 05/24/2019  Glucose 65 - 99 mg/dL 177(N) 88 165(B)  BUN 6 - 24 mg/dL 12 13 9   Creatinine 0.76 - 1.27 mg/dL ) 9.03(Y) 3.33(O)  Sodium 134 - 144 mmol/L 143 142 137  Potassium 3.5 - 5.2 mmol/L 3.9 3.8 4.3  Chloride 96 - 106 mmol/L 102 105 102  CO2 20 - 29 mmol/L 26 31 24   Calcium 8.7 - 10.2 mg/dL 9.9 9.2 9.6  Total Protein 6.0 - 8.5 g/dL 7.0 6.4 -  Total Bilirubin 0.0 - 1.2 mg/dL 0.3 0.4 -  Alkaline Phos 39 - 117 IU/L 88 - -  AST 0 - 40 IU/L 22 20 -  ALT 0 - 44 IU/L 16 17 -   Lipid Panel     Component Value Date/Time   CHOL 180 12/26/2018 1712   CHOL 180 03/25/2018 1610   TRIG 226 (H) 12/26/2018 1712   HDL 46 12/26/2018 1712   HDL 54 03/25/2018 1610   CHOLHDL 3.9 12/26/2018 1712   LDLCALC 100 (H) 12/26/2018 1712    CBC    Component Value Date/Time   WBC 5.5 08/23/2019 1536   WBC 6.4 06/27/2019 0954   RBC 4.93 08/23/2019 1536   RBC 4.68 06/27/2019 0954   HGB 15.0 08/23/2019 1536   HCT 44.4 08/23/2019 1536  PLT 216 08/23/2019 1536   MCV 90 08/23/2019 1536   MCH 30.4 08/23/2019 1536   MCH 29.7 06/27/2019 0954   MCHC 33.8 08/23/2019 1536   MCHC 33.4 06/27/2019 0954   RDW 13.9  08/23/2019 1536   LYMPHSABS 2.2 08/23/2019 1536   MONOABS 0.4 05/24/2019 1147   EOSABS 0.1 08/23/2019 1536   BASOSABS 0.1 08/23/2019 1536    ASSESSMENT AND PLAN: 1. Diarrhea, unspecified type 2. Fever, unspecified fever cause Patient reporting some persistent symptoms since having the flu vaccine in January of this year. I am not quite sure that if it was the vaccine that the symptoms like this would last this long. He is afebrile today and appears well. -I recommended giving [place] a stool sample to send for culture, ova and parasites. If negative then I would recommend that he sees his ID doctor to see if he can shed some light on what may be causing his symptoms. At this point patient said "so basically call him." He then left the exam room without waiting to give a stool sample or for his discharge summary.    Patient was given the opportunity to ask questions.  Patient verbalized understanding of the plan and was able to repeat key elements of the plan.   No orders of the defined types were placed in this encounter.    Requested Prescriptions    No prescriptions requested or ordered in this encounter    No follow-ups on file.  Jonah Blue, MD, FACP

## 2019-09-15 NOTE — Progress Notes (Signed)
Pt states he is having pain in his neck

## 2019-09-18 ENCOUNTER — Other Ambulatory Visit: Payer: Medicaid Other

## 2019-09-21 DIAGNOSIS — G40309 Generalized idiopathic epilepsy and epileptic syndromes, not intractable, without status epilepticus: Secondary | ICD-10-CM | POA: Diagnosis not present

## 2019-09-21 DIAGNOSIS — G43009 Migraine without aura, not intractable, without status migrainosus: Secondary | ICD-10-CM | POA: Diagnosis not present

## 2019-09-24 ENCOUNTER — Other Ambulatory Visit: Payer: Self-pay | Admitting: Infectious Disease

## 2019-09-24 DIAGNOSIS — B20 Human immunodeficiency virus [HIV] disease: Secondary | ICD-10-CM

## 2019-09-28 ENCOUNTER — Other Ambulatory Visit: Payer: Medicaid Other

## 2019-09-28 ENCOUNTER — Other Ambulatory Visit: Payer: Self-pay

## 2019-09-28 DIAGNOSIS — B20 Human immunodeficiency virus [HIV] disease: Secondary | ICD-10-CM | POA: Diagnosis not present

## 2019-09-28 DIAGNOSIS — A539 Syphilis, unspecified: Secondary | ICD-10-CM

## 2019-09-29 LAB — T-HELPER CELL (CD4) - (RCID CLINIC ONLY)
CD4 % Helper T Cell: 39 % (ref 33–65)
CD4 T Cell Abs: 709 /uL (ref 400–1790)

## 2019-09-30 LAB — CBC WITH DIFFERENTIAL/PLATELET
Absolute Monocytes: 425 cells/uL (ref 200–950)
Basophils Absolute: 40 cells/uL (ref 0–200)
Basophils Relative: 0.8 %
Eosinophils Absolute: 90 cells/uL (ref 15–500)
Eosinophils Relative: 1.8 %
HCT: 40.4 % (ref 38.5–50.0)
Hemoglobin: 13.4 g/dL (ref 13.2–17.1)
Lymphs Abs: 1875 cells/uL (ref 850–3900)
MCH: 30.1 pg (ref 27.0–33.0)
MCHC: 33.2 g/dL (ref 32.0–36.0)
MCV: 90.8 fL (ref 80.0–100.0)
MPV: 11 fL (ref 7.5–12.5)
Monocytes Relative: 8.5 %
Neutro Abs: 2570 cells/uL (ref 1500–7800)
Neutrophils Relative %: 51.4 %
Platelets: 176 10*3/uL (ref 140–400)
RBC: 4.45 10*6/uL (ref 4.20–5.80)
RDW: 13.1 % (ref 11.0–15.0)
Total Lymphocyte: 37.5 %
WBC: 5 10*3/uL (ref 3.8–10.8)

## 2019-09-30 LAB — COMPLETE METABOLIC PANEL WITH GFR
AG Ratio: 2 (calc) (ref 1.0–2.5)
ALT: 12 U/L (ref 9–46)
AST: 17 U/L (ref 10–40)
Albumin: 4.1 g/dL (ref 3.6–5.1)
Alkaline phosphatase (APISO): 63 U/L (ref 36–130)
BUN: 11 mg/dL (ref 7–25)
CO2: 28 mmol/L (ref 20–32)
Calcium: 8.8 mg/dL (ref 8.6–10.3)
Chloride: 103 mmol/L (ref 98–110)
Creat: 1.31 mg/dL (ref 0.60–1.35)
GFR, Est African American: 77 mL/min/{1.73_m2} (ref >=60)
GFR, Est Non African American: 67 mL/min/{1.73_m2} (ref >=60)
Globulin: 2.1 g/dL (calc) (ref 1.9–3.7)
Glucose, Bld: 78 mg/dL (ref 65–99)
Potassium: 3.5 mmol/L (ref 3.5–5.3)
Sodium: 138 mmol/L (ref 135–146)
Total Bilirubin: 0.5 mg/dL (ref 0.2–1.2)
Total Protein: 6.2 g/dL (ref 6.1–8.1)

## 2019-09-30 LAB — RPR: RPR Ser Ql: REACTIVE — AB

## 2019-09-30 LAB — RPR TITER: RPR Titer: 1:16 {titer} — ABNORMAL HIGH

## 2019-09-30 LAB — HIV-1 RNA QUANT-NO REFLEX-BLD
HIV 1 RNA Quant: 23 copies/mL — ABNORMAL HIGH
HIV-1 RNA Quant, Log: 1.36 Log copies/mL — ABNORMAL HIGH

## 2019-09-30 LAB — FLUORESCENT TREPONEMAL AB(FTA)-IGG-BLD: Fluorescent Treponemal ABS: REACTIVE — AB

## 2019-10-03 ENCOUNTER — Encounter: Payer: Self-pay | Admitting: Infectious Disease

## 2019-10-03 ENCOUNTER — Ambulatory Visit (INDEPENDENT_AMBULATORY_CARE_PROVIDER_SITE_OTHER): Payer: Medicaid Other | Admitting: Infectious Disease

## 2019-10-03 ENCOUNTER — Other Ambulatory Visit: Payer: Self-pay

## 2019-10-03 VITALS — BP 121/85 | Wt 173.0 lb

## 2019-10-03 DIAGNOSIS — A539 Syphilis, unspecified: Secondary | ICD-10-CM | POA: Diagnosis not present

## 2019-10-03 DIAGNOSIS — F1290 Cannabis use, unspecified, uncomplicated: Secondary | ICD-10-CM

## 2019-10-03 DIAGNOSIS — R509 Fever, unspecified: Secondary | ICD-10-CM

## 2019-10-03 DIAGNOSIS — B20 Human immunodeficiency virus [HIV] disease: Secondary | ICD-10-CM | POA: Diagnosis not present

## 2019-10-03 DIAGNOSIS — G40009 Localization-related (focal) (partial) idiopathic epilepsy and epileptic syndromes with seizures of localized onset, not intractable, without status epilepticus: Secondary | ICD-10-CM

## 2019-10-03 HISTORY — DX: Fever, unspecified: R50.9

## 2019-10-03 NOTE — Progress Notes (Signed)
Subjective:   Chief complaint: he had fevers, myalgias the month after we gave him fluvaccine and pneumovax    Patient ID: Perry Norton, male    DOB: 11/28/1975, 44 y.o.   MRN: [id number]  HPI  This 44 year-old African-American man with HIV that has been well controlled on Biktarvy.  He does have a seizure disorder and is followed by neurologist in [facility].  He does also smoke marijuana which he believes helps with his seizures.  He states that after we gave vaccines in January he felt poorly for a month with fevers, myalgias and loose stools and that [facility] but "they didn't do anything"  He was offered stool analysis but left room befoe anything could be done as far as test  He tells me tested negative for CovID  I  Assured him that these symptoms were highly  UNLIKELY to have anything to do with 2 non live viruses but that perhaps he had in fact contracted COVID 19 or some other virus instead.       Past Medical History:  Diagnosis Date  . HIV (human immunodeficiency virus infection) (HCC)   . Marijuana use 07/12/2019  . Seizures (HCC)    epilepsy  . Visual loss 09/07/2018    Past Surgical History:  Procedure Laterality Date  . NO PAST SURGERIES      Family History  Problem Relation Age of Onset  . Sarcoidosis Mother       Social History   Socioeconomic History  . Marital status: Married    Spouse name: Not on file  . Number of children: Not on file  . Years of education: Not on file  . Highest education level: Not on file  Occupational History  . Not on file  Tobacco Use  . Smoking status: Current Some Day Smoker    Packs/day: 1.00    Types: Cigarettes    Start date: 06/22/1994  . Smokeless tobacco: Never Used  Substance and Sexual Activity  . Alcohol use: Yes    Alcohol/week: 3.0 standard drinks    Types: 3 Cans of beer per week    Comment: 1 time a week  . Drug use: Yes    Types: Marijuana    Comment: daily    . Sexual activity: Yes  Other Topics Concern  . Not on file  Social History Narrative  . Not on file   Social Determinants of Health   Financial Resource Strain:   . Difficulty of Paying Living Expenses:   Food Insecurity:   . Worried About Programme researcher, broadcasting/film/video in the Last Year:   . Barista in the Last Year:   Transportation Needs:   . Freight forwarder (Medical):   Marland Kitchen Lack of Transportation (Non-Medical):   Physical Activity:   . Days of Exercise per Week:   . Minutes of Exercise per Session:   Stress:   . Feeling of Stress :   Social Connections:   . Frequency of Communication with Friends and Family:   . Frequency of Social Gatherings with Friends and Family:   . Attends Religious Services:   . Active Member of Clubs or Organizations:   . Attends Banker Meetings:   Marland Kitchen Marital Status:     Allergies  Allergen Reactions  . Onion Rash     Current Outpatient Medications:  .  acetaminophen (TYLENOL) 500 MG tablet, Take 2 tablets (1,000  mg total) by mouth every 6 (six) hours as needed for mild pain or moderate pain., Disp: 90 tablet, Rfl: 0 .  BIKTARVY 50-200-25 MG TABS tablet, TAKE 1 TABLET BY MOUTH EVERY DAY, Disp: 30 tablet, Rfl: 5 .  Cholecalciferol (VITAMIN D-3) 125 MCG (5000 UT) TABS, 2 PO qd x 3 months, then 1 PO qd long-term, Disp: 180 tablet, Rfl: 3 .  diclofenac sodium (VOLTAREN) 1 % GEL, Apply 2 g topically 4 (four) times daily. Rub into affected area of foot 2 to 4 times daily, Disp: 100 g, Rfl: 2 .  diclofenac sodium (VOLTAREN) 1 % GEL, Apply 2 g topically 4 (four) times daily., Disp: 350 g, Rfl: 0 .  divalproex (DEPAKOTE) 500 MG DR tablet, Take 2 tabs in AM, 1/2 tab at noon, 2 tabs in PM, Disp: 135 tablet, Rfl: 1 .  DULoxetine (CYMBALTA) 30 MG capsule, Take 1 capsule (30 mg total) by mouth daily. X 1 week then 2 tab/60 mg daily- for nerve pain, Disp: 60 capsule, Rfl: 5 .  gabapentin (NEURONTIN) 300 MG capsule, Take 1 capsule (300 mg  total) by mouth 3 (three) times daily., Disp: 90 capsule, Rfl: 3 .  guaiFENesin-codeine 100-10 MG/5ML syrup, Take 5 mLs by mouth every 6 (six) hours as needed for cough. /Chest congestion, Disp: 120 mL, Rfl: 0 .  HYDROcodone-acetaminophen (NORCO) 7.5-325 MG tablet, Take 1 tablet by mouth every 6 (six) hours as needed for moderate pain., Disp: 10 tablet, Rfl: 0 .  lacosamide (VIMPAT) 200 MG TABS tablet, Take 1 tablet (200 mg total) by mouth 2 (two) times daily., Disp: 60 tablet, Rfl: 5 .  losartan (COZAAR) 50 MG tablet, Take 1 tablet (50 mg total) by mouth daily., Disp: 30 tablet, Rfl: 1 .  ondansetron (ZOFRAN) 8 MG tablet, Take 1 tablet (8 mg total) by mouth every 8 (eight) hours as needed for nausea or vomiting., Disp: 20 tablet, Rfl: 0 .  sildenafil (VIAGRA) 25 MG tablet, Take 1 tablet (25 mg total) by mouth daily as needed for erectile dysfunction., Disp: 10 tablet, Rfl: 2 .  topiramate (TOPAMAX) 50 MG tablet, Take 1 tablet twice a day, Disp: 120 tablet, Rfl: 6 .  Vitamin D, Ergocalciferol, (DRISDOL) 1.25 MG (50000 UT) CAPS capsule, Take 1 capsule (50,000 Units total) by mouth every 7 (seven) days., Disp: 16 capsule, Rfl: 0    Review of Systems  Constitutional: Negative for activity change, appetite change, chills, diaphoresis, fatigue, fever and unexpected weight change.  HENT: Negative for congestion, rhinorrhea, sinus pressure, sneezing, sore throat and trouble swallowing.   Eyes: Negative for photophobia.  Respiratory: Negative for cough, chest tightness, shortness of breath, wheezing and stridor.   Cardiovascular: Negative for chest pain, palpitations and leg swelling.  Gastrointestinal: Negative for abdominal distention, abdominal pain, anal bleeding, blood in stool, constipation, diarrhea, nausea and vomiting.  Genitourinary: Negative for difficulty urinating, dysuria, flank pain and hematuria.  Musculoskeletal: Negative for arthralgias, back pain, gait problem, joint swelling and  myalgias.  Skin: Negative for color change, pallor and rash.  Neurological: Negative for dizziness, tremors, weakness and light-headedness.  Hematological: Negative for adenopathy. Does not bruise/bleed easily.  Psychiatric/Behavioral: Negative for agitation, behavioral problems, confusion, decreased concentration, dysphoric mood and sleep disturbance.       Objective:   Physical Exam Constitutional:      General: He is not in acute distress.    Appearance: Normal appearance. He is well-developed. He is not ill-appearing or diaphoretic.  HENT:     Head:  Normocephalic and atraumatic.     Right Ear: Hearing and external ear normal.     Left Ear: Hearing and external ear normal.     Nose: No nasal deformity or rhinorrhea.  Eyes:     General: No scleral icterus.    Conjunctiva/sclera: Conjunctivae normal.     Right eye: Right conjunctiva is not injected.     Left eye: Left conjunctiva is not injected.     Pupils: Pupils are equal, round, and reactive to light.  Neck:     Vascular: No JVD.  Cardiovascular:     Rate and Rhythm: Normal rate.     Heart sounds: S1 normal and S2 normal.  Pulmonary:     Effort: Pulmonary effort is normal. No respiratory distress.  Abdominal:     General: There is no distension.     Palpations: Abdomen is soft.  Musculoskeletal:        General: Normal range of motion.     Right shoulder: Normal.     Left shoulder: Normal.     Cervical back: Normal range of motion and neck supple.     Right hip: Normal.     Left hip: Normal.     Right knee: Normal.     Left knee: Normal.  Lymphadenopathy:     Head:     Right side of head: No submandibular, preauricular or posterior auricular adenopathy.     Left side of head: No submandibular, preauricular or posterior auricular adenopathy.     Cervical: No cervical adenopathy.     Right cervical: No superficial or deep cervical adenopathy.    Left cervical: No superficial or deep cervical adenopathy.  Skin:     General: Skin is warm and dry.     Coloration: Skin is not pale.     Findings: No abrasion, bruising, ecchymosis, erythema, lesion or rash.     Nails: There is no clubbing.  Neurological:     General: No focal deficit present.     Mental Status: He is alert and oriented to person, place, and time.     Sensory: No sensory deficit.     Coordination: Coordination normal.     Gait: Gait normal.  Psychiatric:        Attention and Perception: He is attentive.        Mood and Affect: Mood normal.        Speech: Speech normal.        Behavior: Behavior normal. Behavior is cooperative.        Thought Content: Thought content normal.        Cognition and Memory: Cognition and memory normal.        Judgment: Judgment normal.           Assessment & Plan:   HIV disease: Continue with Biktarvy RTC In 6 months  Seizure disorder being followed by Neurology in [place]  Syphilis: Since the titer came to 116 and remains at this state  Marijuana use: I do not really have a problem with it as he is does not get into legal problems.  Flu like symptoms x month: now resolved. Check COVID 19 ser

## 2019-10-04 LAB — SARS COV-2 SEROLOGY(COVID-19)AB(IGG,IGM),IMMUNOASSAY
SARS CoV-2 AB IgG: NEGATIVE
SARS CoV-2 IgM: NEGATIVE

## 2019-10-05 ENCOUNTER — Other Ambulatory Visit: Payer: Self-pay

## 2019-10-05 ENCOUNTER — Emergency Department (HOSPITAL_COMMUNITY)
Admission: EM | Admit: 2019-10-05 | Discharge: 2019-10-05 | Disposition: A | Discharge status: Home / Self Care | Payer: Medicaid Other | Attending: Emergency Medicine | Admitting: Emergency Medicine

## 2019-10-05 ENCOUNTER — Encounter (HOSPITAL_COMMUNITY): Payer: Self-pay

## 2019-10-05 ENCOUNTER — Emergency Department (HOSPITAL_COMMUNITY): Payer: Medicaid Other

## 2019-10-05 DIAGNOSIS — R079 Chest pain, unspecified: Secondary | ICD-10-CM | POA: Diagnosis not present

## 2019-10-05 DIAGNOSIS — Z5321 Procedure and treatment not carried out due to patient leaving prior to being seen by health care provider: Secondary | ICD-10-CM | POA: Diagnosis not present

## 2019-10-05 DIAGNOSIS — K625 Hemorrhage of anus and rectum: Secondary | ICD-10-CM | POA: Diagnosis present

## 2019-10-05 LAB — CBC
HCT: 47.8 % (ref 39.0–52.0)
Hemoglobin: 15.3 g/dL (ref 13.0–17.0)
MCH: 29.5 pg (ref 26.0–34.0)
MCHC: 32 g/dL (ref 30.0–36.0)
MCV: 92.3 fL (ref 80.0–100.0)
Platelets: 194 10*3/uL (ref 150–400)
RBC: 5.18 MIL/uL (ref 4.22–5.81)
RDW: 13.2 % (ref 11.5–15.5)
WBC: 7.2 10*3/uL (ref 4.0–10.5)
nRBC: 0 % (ref 0.0–0.2)

## 2019-10-05 LAB — TYPE AND SCREEN
ABO/RH(D): A POS
Antibody Screen: NEGATIVE

## 2019-10-05 LAB — COMPREHENSIVE METABOLIC PANEL
ALT: 18 U/L (ref 0–44)
AST: 27 U/L (ref 15–41)
Albumin: 4.4 g/dL (ref 3.5–5.0)
Alkaline Phosphatase: 64 U/L (ref 38–126)
Anion gap: 11 (ref 5–15)
BUN: 7 mg/dL (ref 6–20)
CO2: 27 mmol/L (ref 22–32)
Calcium: 9.3 mg/dL (ref 8.9–10.3)
Chloride: 103 mmol/L (ref 98–111)
Creatinine, Ser: 1.48 mg/dL — ABNORMAL HIGH (ref 0.61–1.24)
GFR calc Af Amer: >60 mL/min (ref >60)
GFR calc non Af Amer: 58 mL/min — ABNORMAL LOW (ref >60)
Glucose, Bld: 114 mg/dL — ABNORMAL HIGH (ref 70–99)
Potassium: 3.4 mmol/L — ABNORMAL LOW (ref 3.5–5.1)
Sodium: 141 mmol/L (ref 135–145)
Total Bilirubin: 0.8 mg/dL (ref 0.3–1.2)
Total Protein: 7.2 g/dL (ref 6.5–8.1)

## 2019-10-05 LAB — TROPONIN I (HIGH SENSITIVITY): Troponin I (High Sensitivity): 7 ng/L (ref <18)

## 2019-10-05 NOTE — ED Triage Notes (Signed)
Pt arrives to ED w/ c/o gi bleeding. States he has been having bright red rectal bleeding and has been vomiting bright red blood. Pt denies abdominal pain, endorses chest pain and SOB.

## 2019-11-08 DIAGNOSIS — G40309 Generalized idiopathic epilepsy and epileptic syndromes, not intractable, without status epilepticus: Secondary | ICD-10-CM | POA: Diagnosis not present

## 2019-11-27 DIAGNOSIS — H524 Presbyopia: Secondary | ICD-10-CM | POA: Diagnosis not present

## 2019-12-14 DIAGNOSIS — F4310 Post-traumatic stress disorder, unspecified: Secondary | ICD-10-CM | POA: Diagnosis not present

## 2019-12-18 DIAGNOSIS — H5213 Myopia, bilateral: Secondary | ICD-10-CM | POA: Diagnosis not present

## 2019-12-18 DIAGNOSIS — G40309 Generalized idiopathic epilepsy and epileptic syndromes, not intractable, without status epilepticus: Secondary | ICD-10-CM | POA: Diagnosis not present

## 2019-12-20 DIAGNOSIS — G40309 Generalized idiopathic epilepsy and epileptic syndromes, not intractable, without status epilepticus: Secondary | ICD-10-CM | POA: Diagnosis not present

## 2019-12-20 DIAGNOSIS — G43009 Migraine without aura, not intractable, without status migrainosus: Secondary | ICD-10-CM | POA: Diagnosis not present

## 2020-01-26 ENCOUNTER — Telehealth: Payer: Self-pay | Admitting: General Practice

## 2020-01-26 NOTE — Telephone Encounter (Signed)
Patient can switch providers if he would like.   Copied from CRM [id number]. Topic: Appointment Scheduling - Scheduling Inquiry for Clinic >> Jan 22, 2020  9:49 AM Angela Nevin wrote: Patient would like to know if he can switch PCP to Dr. Delford Field. He prefers a male provider.He states he will call back to get answer, and requested that he not be called from office (?)

## 2020-02-05 ENCOUNTER — Emergency Department (HOSPITAL_COMMUNITY)
Admission: EM | Admit: 2020-02-05 | Discharge: 2020-02-05 | Disposition: A | Discharge status: Home / Self Care | Payer: Medicaid Other | Attending: Emergency Medicine | Admitting: Emergency Medicine

## 2020-02-05 ENCOUNTER — Ambulatory Visit (HOSPITAL_COMMUNITY)
Admission: EM | Admit: 2020-02-05 | Discharge: 2020-02-05 | Discharge status: Against Medical Advice | Payer: Medicaid Other

## 2020-02-05 ENCOUNTER — Other Ambulatory Visit: Payer: Self-pay

## 2020-02-05 ENCOUNTER — Emergency Department (HOSPITAL_COMMUNITY)
Admission: EM | Admit: 2020-02-05 | Discharge: 2020-02-06 | Disposition: A | Discharge status: Home / Self Care | Payer: Medicaid Other | Source: Home / Self Care

## 2020-02-05 DIAGNOSIS — R519 Headache, unspecified: Secondary | ICD-10-CM | POA: Insufficient documentation

## 2020-02-05 DIAGNOSIS — Y939 Activity, unspecified: Secondary | ICD-10-CM | POA: Insufficient documentation

## 2020-02-05 DIAGNOSIS — Y929 Unspecified place or not applicable: Secondary | ICD-10-CM | POA: Insufficient documentation

## 2020-02-05 DIAGNOSIS — Z5321 Procedure and treatment not carried out due to patient leaving prior to being seen by health care provider: Secondary | ICD-10-CM | POA: Insufficient documentation

## 2020-02-05 DIAGNOSIS — R42 Dizziness and giddiness: Secondary | ICD-10-CM | POA: Diagnosis not present

## 2020-02-05 DIAGNOSIS — Y999 Unspecified external cause status: Secondary | ICD-10-CM | POA: Insufficient documentation

## 2020-02-05 DIAGNOSIS — W228XXA Striking against or struck by other objects, initial encounter: Secondary | ICD-10-CM | POA: Diagnosis not present

## 2020-02-05 LAB — CBC
HCT: 41 % (ref 39.0–52.0)
Hemoglobin: 13.4 g/dL (ref 13.0–17.0)
MCH: 30 pg (ref 26.0–34.0)
MCHC: 32.7 g/dL (ref 30.0–36.0)
MCV: 91.9 fL (ref 80.0–100.0)
Platelets: 201 10*3/uL (ref 150–400)
RBC: 4.46 MIL/uL (ref 4.22–5.81)
RDW: 13 % (ref 11.5–15.5)
WBC: 4.5 10*3/uL (ref 4.0–10.5)
nRBC: 0 % (ref 0.0–0.2)

## 2020-02-05 LAB — VALPROIC ACID LEVEL: Valproic Acid Lvl: <10 ug/mL — ABNORMAL LOW (ref 50.0–100.0)

## 2020-02-05 LAB — BASIC METABOLIC PANEL
Anion gap: 9 (ref 5–15)
BUN: 8 mg/dL (ref 6–20)
CO2: 27 mmol/L (ref 22–32)
Calcium: 9.4 mg/dL (ref 8.9–10.3)
Chloride: 104 mmol/L (ref 98–111)
Creatinine, Ser: 1.36 mg/dL — ABNORMAL HIGH (ref 0.61–1.24)
GFR calc Af Amer: >60 mL/min (ref >60)
GFR calc non Af Amer: >60 mL/min (ref >60)
Glucose, Bld: 143 mg/dL — ABNORMAL HIGH (ref 70–99)
Potassium: 3.8 mmol/L (ref 3.5–5.1)
Sodium: 140 mmol/L (ref 135–145)

## 2020-02-05 NOTE — ED Triage Notes (Addendum)
Pt reports on Friday while at work he passed out and hit the back of his head. Pt sent here by his work to be evaluated. Pt c.o headache and dizziness at this time. Pt has hx of sPt a.o, ambulatory.

## 2020-02-05 NOTE — ED Notes (Signed)
Patient is being discharged from the Urgent Care and sent to the Emergency Department via designated driver . Per Dorene Grebe NP, patient is in need of higher level of care due to head injury. Patient is aware and verbalizes understanding of plan of care.  Vitals:   02/05/20 1942  BP: (!) 137/109  Pulse: 77  Resp: 16  Temp: 98 F (36.7 C)  SpO2: 100%

## 2020-02-05 NOTE — ED Triage Notes (Signed)
Called x's 3 to triage without response

## 2020-02-05 NOTE — ED Triage Notes (Signed)
Pt reports fall on Friday, + LOC, c/o headache and dizziness with intermittent memory loss. Ambulatory.

## 2020-02-05 NOTE — ED Triage Notes (Signed)
Pt c/o headache x's 4 days.  Pt walked out of triage before finished being triaged.

## 2020-02-06 ENCOUNTER — Telehealth: Payer: Self-pay | Admitting: *Deleted

## 2020-02-06 NOTE — ED Notes (Signed)
Taking pt OTF per triage nurse request

## 2020-02-06 NOTE — Telephone Encounter (Signed)
Medicaid Managed Care team Transition of Care Assessment outreach attempt #1 made today. Unable to reach patient. HIPPA compliant voice message left requesting a return call. The patient has also been enrolled in an automated discharge follow up call series and will receive two outreach attempts for transition of care assessment. Contact information has been left for the patient and the Medicaid Managed Care team is available to provide assistance to the patient at any time.  ° °Katrice Makaylynn Bonillas, RN, BSN, CCRN °Patient Engagement Center °[phone] ° °

## 2020-02-06 NOTE — Telephone Encounter (Addendum)
[name] presented to the ED and left before being seen by the provider twice on  02/05/20. The patient has been enrolled in an automated general discharge outreach program and 2 attempts to contact the patient will be made to follow up on their ED visit and subsequent needs. The care management team is available to provide assistance to this patient at any time.   Burnard Bunting, RN, BSN, CCRN Patient Engagement Center [phone]

## 2020-02-06 NOTE — ED Notes (Signed)
Pt did not respond to roll call.

## 2020-02-07 ENCOUNTER — Telehealth: Payer: Self-pay | Admitting: *Deleted

## 2020-02-07 NOTE — Telephone Encounter (Signed)
2nd attempt to contact pt to complete transition of care assessment. Left message for patient with youn lady who answered.  Burnard Bunting, RN, BSN, CCRN Patient Engagement Center [phone]

## 2020-02-08 ENCOUNTER — Emergency Department (HOSPITAL_COMMUNITY): Payer: Medicaid Other

## 2020-02-08 ENCOUNTER — Emergency Department (HOSPITAL_COMMUNITY)
Admission: EM | Admit: 2020-02-08 | Discharge: 2020-02-09 | Disposition: A | Discharge status: Home / Self Care | Payer: Medicaid Other | Attending: Emergency Medicine | Admitting: Emergency Medicine

## 2020-02-08 DIAGNOSIS — B20 Human immunodeficiency virus [HIV] disease: Secondary | ICD-10-CM | POA: Insufficient documentation

## 2020-02-08 DIAGNOSIS — I10 Essential (primary) hypertension: Secondary | ICD-10-CM | POA: Diagnosis not present

## 2020-02-08 DIAGNOSIS — R0602 Shortness of breath: Secondary | ICD-10-CM | POA: Diagnosis not present

## 2020-02-08 DIAGNOSIS — Y929 Unspecified place or not applicable: Secondary | ICD-10-CM | POA: Insufficient documentation

## 2020-02-08 DIAGNOSIS — R55 Syncope and collapse: Secondary | ICD-10-CM | POA: Insufficient documentation

## 2020-02-08 DIAGNOSIS — Y999 Unspecified external cause status: Secondary | ICD-10-CM | POA: Diagnosis not present

## 2020-02-08 DIAGNOSIS — S0990XA Unspecified injury of head, initial encounter: Secondary | ICD-10-CM | POA: Diagnosis not present

## 2020-02-08 DIAGNOSIS — R05 Cough: Secondary | ICD-10-CM | POA: Insufficient documentation

## 2020-02-08 DIAGNOSIS — R4182 Altered mental status, unspecified: Secondary | ICD-10-CM | POA: Diagnosis not present

## 2020-02-08 DIAGNOSIS — R200 Anesthesia of skin: Secondary | ICD-10-CM | POA: Insufficient documentation

## 2020-02-08 DIAGNOSIS — R262 Difficulty in walking, not elsewhere classified: Secondary | ICD-10-CM | POA: Diagnosis not present

## 2020-02-08 DIAGNOSIS — R29810 Facial weakness: Secondary | ICD-10-CM | POA: Diagnosis not present

## 2020-02-08 DIAGNOSIS — W19XXXA Unspecified fall, initial encounter: Secondary | ICD-10-CM | POA: Insufficient documentation

## 2020-02-08 DIAGNOSIS — R410 Disorientation, unspecified: Secondary | ICD-10-CM | POA: Diagnosis not present

## 2020-02-08 DIAGNOSIS — G4489 Other headache syndrome: Secondary | ICD-10-CM | POA: Diagnosis not present

## 2020-02-08 DIAGNOSIS — R519 Headache, unspecified: Secondary | ICD-10-CM | POA: Diagnosis not present

## 2020-02-08 DIAGNOSIS — Y939 Activity, unspecified: Secondary | ICD-10-CM | POA: Insufficient documentation

## 2020-02-08 LAB — BASIC METABOLIC PANEL
Anion gap: 11 (ref 5–15)
BUN: 9 mg/dL (ref 6–20)
CO2: 25 mmol/L (ref 22–32)
Calcium: 9.4 mg/dL (ref 8.9–10.3)
Chloride: 102 mmol/L (ref 98–111)
Creatinine, Ser: 1.37 mg/dL — ABNORMAL HIGH (ref 0.61–1.24)
GFR calc Af Amer: >60 mL/min (ref >60)
GFR calc non Af Amer: >60 mL/min (ref >60)
Glucose, Bld: 99 mg/dL (ref 70–99)
Potassium: 4.8 mmol/L (ref 3.5–5.1)
Sodium: 138 mmol/L (ref 135–145)

## 2020-02-08 LAB — CBC
HCT: 46.8 % (ref 39.0–52.0)
Hemoglobin: 14.9 g/dL (ref 13.0–17.0)
MCH: 29 pg (ref 26.0–34.0)
MCHC: 31.8 g/dL (ref 30.0–36.0)
MCV: 91.2 fL (ref 80.0–100.0)
Platelets: 198 10*3/uL (ref 150–400)
RBC: 5.13 MIL/uL (ref 4.22–5.81)
RDW: 13.1 % (ref 11.5–15.5)
WBC: 4.8 10*3/uL (ref 4.0–10.5)
nRBC: 0 % (ref 0.0–0.2)

## 2020-02-08 NOTE — ED Notes (Signed)
Patient stated "hell na" when asked if I could update his vitals

## 2020-02-08 NOTE — ED Triage Notes (Signed)
Pt had fall with syncope last Friday at work. Since then, pt having headache, difficulty walking, confusion. EMS reports repetitive questioning. [facility] currently doing home visits with him, and that provider found him lethargic and confused this morning. [facility] Case Worker's name is Ladon Applebaum [phone]) and would like to be called with updates/potential discharge so he can arrange transport.

## 2020-02-08 NOTE — ED Notes (Signed)
Pt requesting iv to be taken out this tech verified with triage RN Sarah and iv was removed. Pt refused to let me hold pressure and walked out of the building

## 2020-02-08 NOTE — ED Notes (Signed)
Asked to update vitals.. patient states "am I dying?" and walked outside

## 2020-02-08 NOTE — ED Notes (Signed)
Pt is refusing vital signs.

## 2020-02-09 NOTE — ED Provider Notes (Signed)
[facility] EMERGENCY DEPARTMENT Provider Note   CSN: [id number] Arrival date & time: 02/08/20  1224     History No chief complaint on file.   Perry Norton is a 44 y.o. male.  The history is provided by the patient.  Fall This is a new problem. The current episode started more than 1 week ago. The problem occurs rarely. The problem has been resolved. Pertinent negatives include no chest pain, no abdominal pain, no headaches and no shortness of breath. Nothing aggravates the symptoms. Nothing relieves the symptoms. He has tried nothing for the symptoms. The treatment provided significant relief.  Patient reports fall a week ago.  No weakness, no numbness, no changes in vision or speech.  No f/c/r. No neck pain or stiffness.      Past Medical History:  Diagnosis Date  . Fever 10/03/2019  . HIV (human immunodeficiency virus infection) (HCC)   . Marijuana use 07/12/2019  . Seizures (HCC)    epilepsy  . Visual loss 09/07/2018    Patient Active Problem List   Diagnosis Date Noted  . Fever 10/03/2019  . Marijuana use 07/12/2019  . Nerve pain due to spinal stenosis 05/01/2019  . Spinal cord compression (HCC) 03/21/2019  . Plantar fasciitis 02/12/2019  . Generalized seizure disorder (HCC) 11/15/2018  . Visual loss 09/07/2018  . Diaphoresis 01/19/2018  . ED (erectile dysfunction) 01/19/2018  . Left arm pain 10/18/2017  . Localization-related idiopathic epilepsy and epileptic syndromes with seizures of localized onset, not intractable, without status epilepticus (HCC) 07/22/2017  . Intractable migraine without aura and without status migrainosus 07/22/2017  . Chronic pain syndrome 07/22/2017  . Pleurisy 07/05/2017  . AC separation, type 2, left, initial encounter 05/25/2017  . CRI (chronic renal insufficiency) 05/20/2017  . Syphilis 05/20/2017  . Poor dentition 05/20/2017  . Head trauma 06/10/2015  . 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 Seizure (HCC) 06/10/2015    . Brachial plexopathy 06/07/2014  . Right arm weakness 03/02/2014  . Disseminated zoster 04/09/2011  . Human immunodeficiency virus (HIV) disease (HCC) 11/06/2010  . Numbness and tingling of right arm 11/06/2010    Past Surgical History:  Procedure Laterality Date  . NO PAST SURGERIES         Family History  Problem Relation Age of Onset  . Sarcoidosis Mother     Social History   Tobacco Use  . Smoking status: Current Some Day Smoker    Packs/day: 1.00    Types: Cigarettes    Start date: 06/22/1994  . Smokeless tobacco: Never Used  Vaping Use  . Vaping Use: Never used  Substance Use Topics  . Alcohol use: Yes    Alcohol/week: 3.0 standard drinks    Types: 3 Cans of beer per week    Comment: 1 time a week  . Drug use: Yes    Types: Marijuana    Comment: daily    Home Medications Prior to Admission medications   Medication Sig Start Date End Date Taking? Authorizing Provider  acetaminophen (TYLENOL) 500 MG tablet Take 2 tablets (1,000 mg total) by mouth every 6 (six) hours as needed for mild pain or moderate pain. 05/01/19   Georgetta Haber, NP  BIKTARVY [phone] MG TABS tablet TAKE 1 TABLET BY MOUTH EVERY DAY 09/25/19   Daiva Eves, Lisette Grinder, MD  Cholecalciferol (VITAMIN D-3) 125 MCG (5000 UT) TABS 2 PO qd x 3 months, then 1 PO qd long-term 02/03/19   Hilts, Casimiro Needle, MD  diclofenac  sodium (VOLTAREN) 1 % GEL Apply 2 g topically 4 (four) times daily. Rub into affected area of foot 2 to 4 times daily 01/02/19   Vivi Barrack, DPM  diclofenac sodium (VOLTAREN) 1 % GEL Apply 2 g topically 4 (four) times daily. 05/01/19   Georgetta Haber, NP  divalproex (DEPAKOTE) 500 MG DR tablet Take 2 tabs in AM, 1/2 tab at noon, 2 tabs in PM 08/15/18   Claiborne Rigg, NP  DULoxetine (CYMBALTA) 30 MG capsule Take 1 capsule (30 mg total) by mouth daily. X 1 week then 2 tab/60 mg daily- for nerve pain 03/21/19 03/20/20  Lovorn, Aundra Millet, MD  gabapentin (NEURONTIN) 300 MG capsule Take 1 capsule  (300 mg total) by mouth 3 (three) times daily. 02/06/19   Hilts, Casimiro Needle, MD  guaiFENesin-codeine 100-10 MG/5ML syrup Take 5 mLs by mouth every 6 (six) hours as needed for cough. Jonni Sanger congestion 04/27/19   Fulp, Cammie, MD  HYDROcodone-acetaminophen (NORCO) 7.5-325 MG tablet Take 1 tablet by mouth every 6 (six) hours as needed for moderate pain. 04/26/19   Eustace Moore, MD  lacosamide (VIMPAT) 200 MG TABS tablet Take 1 tablet (200 mg total) by mouth 2 (two) times daily. 07/21/17   Van Clines, MD  losartan (COZAAR) 50 MG tablet Take 1 tablet (50 mg total) by mouth daily. 08/12/18   Hoy Register, MD  ondansetron (ZOFRAN) 8 MG tablet Take 1 tablet (8 mg total) by mouth every 8 (eight) hours as needed for nausea or vomiting. 08/23/19   Anders Simmonds, PA-C  sildenafil (VIAGRA) 25 MG tablet Take 1 tablet (25 mg total) by mouth daily as needed for erectile dysfunction. 01/19/18   Ginnie Smart, MD  topiramate (TOPAMAX) 50 MG tablet Take 1 tablet twice a day 09/03/17   Van Clines, MD  Vitamin D, Ergocalciferol, (DRISDOL) 1.25 MG (50000 UT) CAPS capsule Take 1 capsule (50,000 Units total) by mouth every 7 (seven) days. 05/30/18   Claiborne Rigg, NP    Allergies    Onion  Review of Systems   Review of Systems  Constitutional: Negative for diaphoresis and fever.  HENT: Negative for congestion.   Eyes: Negative for visual disturbance.  Respiratory: Negative for shortness of breath.   Cardiovascular: Negative for chest pain.  Gastrointestinal: Negative for abdominal pain.  Genitourinary: Negative for difficulty urinating.  Musculoskeletal: Negative for arthralgias.  Skin: Negative for rash.  Neurological: Negative for dizziness, seizures, facial asymmetry, speech difficulty, weakness, numbness and headaches.  Psychiatric/Behavioral: Negative for agitation.  All other systems reviewed and are negative.   Physical Exam Updated Vital Signs BP 111/76   Pulse (!) 103   Temp 98.5  F (36.9 C) (Oral)   Resp 20   SpO2 97%   Physical Exam Vitals and nursing note reviewed.  Constitutional:      General: He is not in acute distress.    Appearance: Normal appearance.  HENT:     Head: Normocephalic and atraumatic.     Nose: Nose normal.     Mouth/Throat:     Pharynx: Oropharynx is clear.  Eyes:     Extraocular Movements: Extraocular movements intact.     Conjunctiva/sclera: Conjunctivae normal.     Pupils: Pupils are equal, round, and reactive to light.  Cardiovascular:     Rate and Rhythm: Normal rate and regular rhythm.     Pulses: Normal pulses.     Heart sounds: Normal heart sounds.  Pulmonary:  Effort: Pulmonary effort is normal.     Breath sounds: Normal breath sounds.  Abdominal:     General: Abdomen is flat. Bowel sounds are normal.     Tenderness: There is no abdominal tenderness. There is no guarding.  Musculoskeletal:        General: Normal range of motion.     Cervical back: Normal range of motion and neck supple. No rigidity.  Lymphadenopathy:     Cervical: No cervical adenopathy.  Skin:    General: Skin is warm and dry.  Neurological:     General: No focal deficit present.     Mental Status: He is alert and oriented to person, place, and time.     Cranial Nerves: No cranial nerve deficit.     Deep Tendon Reflexes: Reflexes normal.  Psychiatric:        Thought Content: Thought content normal.     ED Results / Procedures / Treatments   Labs (all labs ordered are listed, but only abnormal results are displayed) Results for orders placed or performed during the hospital encounter of 02/08/20  Basic metabolic panel  Result Value Ref Range   Sodium 138 135 - 145 mmol/L   Potassium 4.8 3.5 - 5.1 mmol/L   Chloride 102 98 - 111 mmol/L   CO2 25 22 - 32 mmol/L   Glucose, Bld 99 70 - 99 mg/dL   BUN 9 6 - 20 mg/dL   Creatinine, Ser 2.77 (H) 0.61 - 1.24 mg/dL   Calcium 9.4 8.9 - 41.2 mg/dL   GFR calc non Af Amer >60 >60 mL/min   GFR  calc Af Amer >60 >60 mL/min   Anion gap 11 5 - 15  CBC  Result Value Ref Range   WBC 4.8 4.0 - 10.5 K/uL   RBC 5.13 4.22 - 5.81 MIL/uL   Hemoglobin 14.9 13.0 - 17.0 g/dL   HCT 87.8 39 - 52 %   MCV 91.2 80.0 - 100.0 fL   MCH 29.0 26.0 - 34.0 pg   MCHC 31.8 30.0 - 36.0 g/dL   RDW 67.6 72.0 - 94.7 %   Platelets 198 150 - 400 K/uL   nRBC 0.0 0.0 - 0.2 %   CT HEAD WO CONTRAST  Result Date: 02/08/2020 CLINICAL DATA:  Fall with syncope six days ago. Headache, difficulty walking and confusion since then. EXAM: CT HEAD WITHOUT CONTRAST TECHNIQUE: Contiguous axial images were obtained from the base of the skull through the vertex without intravenous contrast. COMPARISON:  06/26/2018 FINDINGS: Brain: The brain shows a normal appearance without evidence of malformation, atrophy, old or acute small or large vessel infarction, mass lesion, hemorrhage, hydrocephalus or extra-axial collection. Vascular: No hyperdense vessel. No evidence of atherosclerotic calcification. Skull: Normal.  No traumatic finding.  No focal bone lesion. Sinuses/Orbits: Sinuses are clear. Orbits appear normal. Mastoids are clear. Other: None significant IMPRESSION: Normal head CT. Electronically Signed   By: Paulina Fusi M.D.   On: 02/08/2020 14:20    EKG EKG Interpretation  Date/Time:  Thursday February 08 2020 12:43:04 EDT Ventricular Rate:  73 PR Interval:  144 QRS Duration: 82 QT Interval:  378 QTC Calculation: 416 R Axis:   74 Text Interpretation: Normal sinus rhythm Biatrial enlargement Left ventricular hypertrophy ( Sokolow-Lyon , Romhilt-Estes ) Cannot rule out Septal infarct , age undetermined Abnormal ECG No significant change since last tracing Confirmed by Drema Pry [phone]) on 02/08/2020 11:43:26 PM   Radiology CT HEAD WO CONTRAST  Result Date: 02/08/2020 CLINICAL  DATA:  Fall with syncope six days ago. Headache, difficulty walking and confusion since then. EXAM: CT HEAD WITHOUT CONTRAST TECHNIQUE:  Contiguous axial images were obtained from the base of the skull through the vertex without intravenous contrast. COMPARISON:  06/26/2018 FINDINGS: Brain: The brain shows a normal appearance without evidence of malformation, atrophy, old or acute small or large vessel infarction, mass lesion, hemorrhage, hydrocephalus or extra-axial collection. Vascular: No hyperdense vessel. No evidence of atherosclerotic calcification. Skull: Normal.  No traumatic finding.  No focal bone lesion. Sinuses/Orbits: Sinuses are clear. Orbits appear normal. Mastoids are clear. Other: None significant IMPRESSION: Normal head CT. Electronically Signed   By: Paulina FusiMark  Shogry M.D.   On: 02/08/2020 14:20    Procedures Procedures (including critical care time)  Medications Ordered in ED Medications - No data to display  ED Course  I have reviewed the triage vital signs and the nursing notes.  Pertinent labs & imaging results that were available during my care of the patient were reviewed by me and considered in my medical decision making (see chart for details).  Patient is AO4, states he is compliant with Vimpat and is no longer on depakote.  No signs of infection at this time. No signs of ICH or intracranial infection.  I do not believe this patient needs MRI or LP at this time. I suspect the patient's symptoms may have been due to his ongoing marijuana use.  He is alert and oriented at this time.  No gait abnormality.  Stable for discharge with close follow up.  Strict return precautions given.  Perry Gingerric Norton was evaluated in Emergency Department on 02/09/2020 for the symptoms described in the history of present illness. He was evaluated in the context of the global COVID-19 pandemic, which necessitated consideration that the patient might be at risk for infection with the SARS-CoV-2 virus that causes COVID-19. Institutional protocols and algorithms that pertain to the evaluation of patients at risk for COVID-19 are in a state of  rapid change based on information released by regulatory bodies including the CDC and federal and state organizations. These policies and algorithms were followed during the patient's care in the ED.  Final Clinical Impression(s) / ED Diagnoses  Return for intractable cough, coughing up blood,fevers >100.4 unrelieved by medication, shortness of breath, intractable vomiting, chest pain, shortness of breath, weakness,numbness, changes in speech, facial asymmetry,abdominal pain, passing out,Inability to tolerate liquids or food, cough, altered mental status or any concerns. No signs of systemic illness or infection. The patient is nontoxic-appearing on exam and vital signs are within normal limits.   I have reviewed the triage vital signs and the nursing notes. Pertinent labs &imaging results that were available during my care of the patient were reviewed by me and considered in my medical decision making (see chart for details).After history, exam, and medical workup I feel the patient has beenappropriately medically screened and is safe for discharge home. Pertinent diagnoses were discussed with the patient. Patient was given return precautions.      Salma Walrond, MD 02/09/20 [id number]

## 2020-02-09 NOTE — ED Notes (Signed)
Patient verbalizes understanding of discharge instructions. Opportunity for questioning and answers were provided. Armband removed by staff, pt discharged from ED stable & ambulatory  

## 2020-02-13 ENCOUNTER — Other Ambulatory Visit: Payer: Self-pay | Admitting: *Deleted

## 2020-02-13 ENCOUNTER — Telehealth: Payer: Self-pay | Admitting: *Deleted

## 2020-02-13 ENCOUNTER — Other Ambulatory Visit: Payer: Self-pay

## 2020-02-13 DIAGNOSIS — G40309 Generalized idiopathic epilepsy and epileptic syndromes, not intractable, without status epilepticus: Secondary | ICD-10-CM

## 2020-02-13 NOTE — Telephone Encounter (Signed)
Medicaid Managed Care team Transition of Care Assessment outreach attempt #1 made today. Unable to reach patient. HIPPA compliant voice message left requesting a return call. The patient has also been enrolled in an automated discharge follow up call series and will receive two outreach attempts for transition of care assessment. Contact information has been left for the patient and the [facility] Managed Care team is available to provide assistance to the patient at any time.    Order for [facility] Coordination placed due to  >2 hospital visits in 2 weeks.  Burnard Bunting, RN, BSN, CCRN Patient Engagement Center [phone]

## 2020-02-13 NOTE — Telephone Encounter (Addendum)
Patient called back. Transition of Care Assessment completed:  Transition Care Management Follow-up Telephone Call  . Medicaid Managed Care Transition Call Status:MM [facility] Call Made  . Date of discharge and from where: [facility], 02/09/20  . How have you been since you were released from the hospital? "still having dizzy spells"  . Any questions or concerns? No  TOC TRACKING USE FOR MANAGED MEDICAID REPORTING  Items Reviewed: Marland Kitchen Did the pt receive and understand the discharge instructions provided? No , Instructions not given. The patient states he was "kicked out of room because lady having a baby". The patient also says the physician never came back to see him. . Medications obtained and verified? No  . Any new allergies since your discharge? No  . Dietary orders reviewed?m No . Do you have support at home? Yes, family  Functional Questionnaire: (I = Independent and D = Dependent)  ADLs: Independent Bathing/Dressing:Independent Meal Prep: Independent Eating: Independent Maintaining continence: Independent Transferring/Ambulation: Independent Managing Meds: Independent   Follow up appointments reviewed:   PCP Hospital f/u appt confirmed? Yes   Scheduled to see Dr Doreene Burke, [place] on 02/23/20 @ 1030  . Specialist Hospital f/u appt confirmed? Yes, Patient states he is scheduled to see Dr Otelia Santee, neurology in September 2021. He is not sure of the date  Are transportation arrangements needed? No   If their condition worsens, is the pt aware to call PCP or go to the EmergencyDept.? Yes  Was the patient provided with contact information for the PCP's office or ED?  Yes  Was to pt encouraged to call back with questions or concerns? Yes Patient states he will never go back to the ED based on the care that he received, and how he was treated while there. Will forward to office of patient experience for follow up.

## 2020-02-13 NOTE — Patient Instructions (Signed)
Visit Information  Mr. Dorko was given information about Medicaid Managed Care team care coordination services and consented to engagement with the [facility] Managed Care team.   Your request for further assistance after your emergency depatment visit has been forwarded to the appropriate team. If you wish to reach the team before you receive a call, please dial [phone]  The patient has been provided with contact information for the Managed Medicaid care management team and has been advised to call with any health related questions or concerns.   Marja Kays MHA,BSN,RN,CCM [facility]  [facility] Care Management Coordinator Direct Dial:  [phone]  Fax: [phone]

## 2020-02-13 NOTE — Patient Outreach (Signed)
Medicaid Managed Care Team Care Coordination Note  02/13/2020  Sumeet Geter Feb 07, 1976 [id number]  I have collaborated with the RN performing Transition of Care Assessment for Mr. Camper who, per his request, has reached out to the appropriate team for follow up of ED visit. The [facility] Managed Care team is available to assist with Mr. Schaberg's care management and care coordination needs.   Marja Kays MHA,BSN,RN,CCM [facility]  [facility] Care Management Coordinator Direct Dial:  [phone]  Fax: [phone]

## 2020-02-19 ENCOUNTER — Telehealth: Payer: Self-pay | Admitting: Family Medicine

## 2020-02-19 DIAGNOSIS — S060X1A Concussion with loss of consciousness of 30 minutes or less, initial encounter: Secondary | ICD-10-CM | POA: Insufficient documentation

## 2020-02-19 DIAGNOSIS — G40309 Generalized idiopathic epilepsy and epileptic syndromes, not intractable, without status epilepticus: Secondary | ICD-10-CM | POA: Diagnosis not present

## 2020-02-19 DIAGNOSIS — G43009 Migraine without aura, not intractable, without status migrainosus: Secondary | ICD-10-CM | POA: Diagnosis not present

## 2020-02-19 HISTORY — DX: Concussion with loss of consciousness of 30 minutes or less, initial encounter: S06.0X1A

## 2020-02-19 NOTE — Telephone Encounter (Signed)
Spoke with patient regarding his insurance plan. I let him know [employer] does not have Blasdell Grandover listed as his PCP and that we can still see him as a patient but services may be limited. Patient will call his plan to change his PCP to [place]. Patient was given our NPI# and office address and instructed to call back if he has any other questions.

## 2020-02-20 DIAGNOSIS — H524 Presbyopia: Secondary | ICD-10-CM | POA: Diagnosis not present

## 2020-02-22 ENCOUNTER — Other Ambulatory Visit: Payer: Self-pay

## 2020-02-23 ENCOUNTER — Ambulatory Visit (INDEPENDENT_AMBULATORY_CARE_PROVIDER_SITE_OTHER): Payer: Medicaid Other | Admitting: Family Medicine

## 2020-02-23 ENCOUNTER — Encounter: Payer: Self-pay | Admitting: Family Medicine

## 2020-02-23 VITALS — BP 110/80 | HR 105 | Temp 98.0°F | Ht 72.0 in | Wt 161.2 lb

## 2020-02-23 DIAGNOSIS — F339 Major depressive disorder, recurrent, unspecified: Secondary | ICD-10-CM | POA: Diagnosis not present

## 2020-02-23 DIAGNOSIS — F321 Major depressive disorder, single episode, moderate: Secondary | ICD-10-CM | POA: Insufficient documentation

## 2020-02-23 DIAGNOSIS — F1210 Cannabis abuse, uncomplicated: Secondary | ICD-10-CM

## 2020-02-23 DIAGNOSIS — I10 Essential (primary) hypertension: Secondary | ICD-10-CM

## 2020-02-23 HISTORY — DX: Cannabis abuse, uncomplicated: F12.10

## 2020-02-23 HISTORY — DX: Major depressive disorder, recurrent, unspecified: F33.9

## 2020-02-23 HISTORY — DX: Major depressive disorder, single episode, moderate: F32.1

## 2020-02-23 HISTORY — DX: Essential (primary) hypertension: I10

## 2020-02-23 LAB — COMPREHENSIVE METABOLIC PANEL
ALT: 13 U/L (ref 0–53)
AST: 18 U/L (ref 0–37)
Albumin: 4.9 g/dL (ref 3.5–5.2)
Alkaline Phosphatase: 63 U/L (ref 39–117)
BUN: 9 mg/dL (ref 6–23)
CO2: 29 mEq/L (ref 19–32)
Calcium: 9.7 mg/dL (ref 8.4–10.5)
Chloride: 102 mEq/L (ref 96–112)
Creatinine, Ser: 1.33 mg/dL (ref 0.40–1.50)
GFR: 71.19 mL/min (ref >60.00)
Glucose, Bld: 98 mg/dL (ref 70–99)
Potassium: 4 mEq/L (ref 3.5–5.1)
Sodium: 139 mEq/L (ref 135–145)
Total Bilirubin: 0.7 mg/dL (ref 0.2–1.2)
Total Protein: 7 g/dL (ref 6.0–8.3)

## 2020-02-23 LAB — CBC
HCT: 42.3 % (ref 39.0–52.0)
Hemoglobin: 13.9 g/dL (ref 13.0–17.0)
MCHC: 32.8 g/dL (ref 30.0–36.0)
MCV: 91.5 fl (ref 78.0–100.0)
Platelets: 195 10*3/uL (ref 150.0–400.0)
RBC: 4.63 Mil/uL (ref 4.22–5.81)
RDW: 13.3 % (ref 11.5–15.5)
WBC: 5.6 10*3/uL (ref 4.0–10.5)

## 2020-02-23 LAB — TSH: TSH: 0.79 u[IU]/mL (ref 0.35–4.50)

## 2020-02-23 NOTE — Addendum Note (Signed)
Addended by: Varney Biles on: 02/23/2020 11:33 AM   Modules accepted: Orders

## 2020-02-23 NOTE — Progress Notes (Addendum)
New Patient Office Visit  Subjective:  Patient ID: Perry Norton, male    DOB: [birth year]  Age: 44 y.o. MRN: [id number]  CC:  Chief Complaint  Patient presents with  . Establish Care    New patient, no concerns.     HPI Perry Norton presents for establishment of care.  Significant past medical history of HIV infection since 2004.  Ongoing treatment through ID.  Seizure disorder over the last 4 years.  He is unaware of the cause.  History of headaches for the last 8 to 9 years.  He is followed by neurology for both of these issues.  History of hypertension that has been treated for 1 year with losartan.  He is recently out of work since August 13 of this year.  He fell and hit his head.  He is on leave.  He smokes marijuana daily.  He has up to 3 beers once a week.  He is seeing me today because his insurance company asked him to establish with a primary care doctor.  He did not get along with his previous doctor.  Past Medical History:  Diagnosis Date  . Fever 10/03/2019  . HIV (human immunodeficiency virus infection) (HCC)   . Marijuana use 07/12/2019  . Seizures (HCC)    epilepsy  . Visual loss 09/07/2018    Past Surgical History:  Procedure Laterality Date  . NO PAST SURGERIES      Family History  Problem Relation Age of Onset  . Sarcoidosis Mother     Social History   Socioeconomic History  . Marital status: Married    Spouse name: Not on file  . Number of children: Not on file  . Years of education: Not on file  . Highest education level: Not on file  Occupational History  . Not on file  Tobacco Use  . Smoking status: Current Some Day Smoker    Packs/day: 1.00    Types: Cigarettes    Start date: 06/22/1994  . Smokeless tobacco: Never Used  Vaping Use  . Vaping Use: Never used  Substance and Sexual Activity  . Alcohol use: Yes    Alcohol/week: 3.0 standard drinks    Types: 3 Cans of beer per week    Comment: 1 time a week  . Drug use: Yes    Types: Marijuana      Comment: daily  . Sexual activity: Yes  Other Topics Concern  . Not on file  Social History Narrative  . Not on file   Social Determinants of Health   Financial Resource Strain:   . Difficulty of Paying Living Expenses: Not on file  Food Insecurity:   . Worried About Programme researcher, broadcasting/film/video in the Last Year: Not on file  . Ran Out of Food in the Last Year: Not on file  Transportation Needs:   . Lack of Transportation (Medical): Not on file  . Lack of Transportation (Non-Medical): Not on file  Physical Activity:   . Days of Exercise per Week: Not on file  . Minutes of Exercise per Session: Not on file  Stress:   . Feeling of Stress : Not on file  Social Connections:   . Frequency of Communication with Friends and Family: Not on file  . Frequency of Social Gatherings with Friends and Family: Not on file  . Attends Religious Services: Not on file  . Active Member of Clubs or Organizations: Not on file  . Attends Banker Meetings: Not  on file  . Marital Status: Not on file  Intimate Partner Violence:   . Fear of Current or Ex-Partner: Not on file  . Emotionally Abused: Not on file  . Physically Abused: Not on file  . Sexually Abused: Not on file    ROS Review of Systems  Constitutional: Negative.   HENT: Negative.   Respiratory: Negative.   Cardiovascular: Negative.   Gastrointestinal: Negative.   Genitourinary: Negative.   Skin: Negative for pallor and rash.  Neurological: Positive for seizures and headaches. Negative for speech difficulty and weakness.  Hematological: Does not bruise/bleed easily.    Objective:   Today's Vitals: BP 110/80   Pulse (!) 105   Temp 98 F (36.7 C) (Tympanic)   Ht 6' (1.829 m)   Wt 161 lb 3.2 oz (73.1 kg)   SpO2 97%   BMI 21.86 kg/m   Physical Exam Constitutional:      General: He is not in acute distress.    Appearance: He is not ill-appearing, toxic-appearing or diaphoretic.     Comments: Somnolent. Reeks of  marihuana   HENT:     Head: Normocephalic and atraumatic.     Right Ear: Tympanic membrane, ear canal and external ear normal.     Left Ear: Tympanic membrane, ear canal and external ear normal.  Eyes:     General: No scleral icterus.       Right eye: No discharge.        Left eye: No discharge.     Conjunctiva/sclera: Conjunctivae normal.     Pupils: Pupils are equal, round, and reactive to light.  Cardiovascular:     Rate and Rhythm: Regular rhythm. Tachycardia present.  Pulmonary:     Effort: Pulmonary effort is normal.     Breath sounds: Normal breath sounds.  Abdominal:     General: Abdomen is flat. Bowel sounds are normal. There is no distension.     Palpations: Abdomen is soft. There is no mass.     Tenderness: There is no abdominal tenderness. There is no guarding or rebound.     Hernia: No hernia is present.  Musculoskeletal:     Cervical back: No rigidity or tenderness.     Right lower leg: No edema.     Left lower leg: No edema.  Lymphadenopathy:     Cervical: No cervical adenopathy.  Skin:    General: Skin is warm and dry.  Neurological:     Mental Status: He is oriented to person, place, and time.  Psychiatric:        Mood and Affect: Mood normal.        Behavior: Behavior normal.     Assessment & Plan:   Problem List Items Addressed This Visit      Cardiovascular and Mediastinum   Essential hypertension - Primary   Relevant Orders   CBC   Comprehensive metabolic panel   TSH   Urinalysis, Routine w reflex microscopic     Other   Marihuana abuse   Relevant Orders   Urine drugs of abuse scrn w alc, routine ([facility])   Depression, recurrent (HCC)   Relevant Orders   Ambulatory referral to Psychology      Outpatient Encounter Medications as of 02/23/2020  Medication Sig  . acetaminophen (TYLENOL) 500 MG tablet Take 2 tablets (1,000 mg total) by mouth every 6 (six) hours as needed for mild pain or moderate pain.  Marland Kitchen BIKTARVY  50-200-25 MG TABS tablet TAKE  1 TABLET BY MOUTH EVERY DAY  . Cholecalciferol (VITAMIN D-3) 125 MCG (5000 UT) TABS 2 PO qd x 3 months, then 1 PO qd long-term  . divalproex (DEPAKOTE) 500 MG DR tablet Take 2 tabs in AM, 1/2 tab at noon, 2 tabs in PM  . DULoxetine (CYMBALTA) 30 MG capsule Take 1 capsule (30 mg total) by mouth daily. X 1 week then 2 tab/60 mg daily- for nerve pain  . gabapentin (NEURONTIN) 300 MG capsule Take 1 capsule (300 mg total) by mouth 3 (three) times daily.  Marland Kitchen lacosamide (VIMPAT) 200 MG TABS tablet Take 1 tablet (200 mg total) by mouth 2 (two) times daily.  Marland Kitchen losartan (COZAAR) 50 MG tablet Take 1 tablet (50 mg total) by mouth daily.  . ondansetron (ZOFRAN) 8 MG tablet Take 1 tablet (8 mg total) by mouth every 8 (eight) hours as needed for nausea or vomiting.  . OXcarbazepine (TRILEPTAL) 150 MG tablet Take 150 mg by mouth daily.  . Oxcarbazepine (TRILEPTAL) 300 MG tablet Take 300 mg by mouth daily.  Marland Kitchen topiramate (TOPAMAX) 50 MG tablet Take 1 tablet twice a day  . Vitamin D, Ergocalciferol, (DRISDOL) 1.25 MG (50000 UT) CAPS capsule Take 1 capsule (50,000 Units total) by mouth every 7 (seven) days.  . diclofenac sodium (VOLTAREN) 1 % GEL Apply 2 g topically 4 (four) times daily. Rub into affected area of foot 2 to 4 times daily (Patient not taking: Reported on 02/23/2020)  . diclofenac sodium (VOLTAREN) 1 % GEL Apply 2 g topically 4 (four) times daily. (Patient not taking: Reported on 02/23/2020)  . guaiFENesin-codeine 100-10 MG/5ML syrup Take 5 mLs by mouth every 6 (six) hours as needed for cough. Jonni Sanger congestion (Patient not taking: Reported on 02/23/2020)  . HYDROcodone-acetaminophen (NORCO) 7.5-325 MG tablet Take 1 tablet by mouth every 6 (six) hours as needed for moderate pain. (Patient not taking: Reported on 02/23/2020)  . sildenafil (VIAGRA) 25 MG tablet Take 1 tablet (25 mg total) by mouth daily as needed for erectile dysfunction. (Patient not taking: Reported on 02/23/2020)   No  facility-administered encounter medications on file as of 02/23/2020.    Follow-up: Return in about 3 months (around 05/24/2020).  Asked patient to stop smoking marijuana.  He agrees to give counseling to try.  Mliss Sax, MD   Refused urine drug screen.

## 2020-03-15 NOTE — Telephone Encounter (Signed)
Error

## 2020-03-20 ENCOUNTER — Other Ambulatory Visit: Payer: Self-pay | Admitting: *Deleted

## 2020-03-20 NOTE — Patient Outreach (Signed)
Care Coordination  03/20/2020  Perry Norton 1975-12-22 [id number]   An unsuccessful telephone outreach was attempted today. The patient was referred to the case management team for assistance with care management and care coordination.   Follow Up Plan: The Managed Medicaid care management team will reach out to the patient again over the next 7 days.   Estanislado Emms RN, BSN [employer]

## 2020-03-20 NOTE — Patient Instructions (Signed)
Visit Information  Mr. Perry Norton  - as a part of your Medicaid benefit, you are eligible for care management and care coordination services at no cost or copay. I was unable to reach you by phone today but would be happy to help you with your health related needs. Please feel free to call me @ [phone].   A member of the Managed Medicaid care management team will reach out to you again over the next 7 days.   Estanislado Emms RN, BSN   [employer]

## 2020-04-03 ENCOUNTER — Other Ambulatory Visit: Payer: Medicaid Other

## 2020-04-04 ENCOUNTER — Other Ambulatory Visit: Payer: Self-pay

## 2020-04-04 ENCOUNTER — Other Ambulatory Visit: Payer: Medicaid Other

## 2020-04-04 DIAGNOSIS — B20 Human immunodeficiency virus [HIV] disease: Secondary | ICD-10-CM

## 2020-04-05 LAB — T-HELPER CELL (CD4) - (RCID CLINIC ONLY)
CD4 % Helper T Cell: 30 % — ABNORMAL LOW (ref 33–65)
CD4 T Cell Abs: 460 /uL (ref 400–1790)

## 2020-04-06 LAB — CBC WITH DIFFERENTIAL/PLATELET
Absolute Monocytes: 574 cells/uL (ref 200–950)
Basophils Absolute: 58 cells/uL (ref 0–200)
Basophils Relative: 1 %
Eosinophils Absolute: 70 cells/uL (ref 15–500)
Eosinophils Relative: 1.2 %
HCT: 42.7 % (ref 38.5–50.0)
Hemoglobin: 14.5 g/dL (ref 13.2–17.1)
Lymphs Abs: 1653 cells/uL (ref 850–3900)
MCH: 30.4 pg (ref 27.0–33.0)
MCHC: 34 g/dL (ref 32.0–36.0)
MCV: 89.5 fL (ref 80.0–100.0)
MPV: 11.3 fL (ref 7.5–12.5)
Monocytes Relative: 9.9 %
Neutro Abs: 3445 cells/uL (ref 1500–7800)
Neutrophils Relative %: 59.4 %
Platelets: 200 10*3/uL (ref 140–400)
RBC: 4.77 10*6/uL (ref 4.20–5.80)
RDW: 13.3 % (ref 11.0–15.0)
Total Lymphocyte: 28.5 %
WBC: 5.8 10*3/uL (ref 3.8–10.8)

## 2020-04-06 LAB — COMPLETE METABOLIC PANEL WITH GFR
AG Ratio: 2.1 (calc) (ref 1.0–2.5)
ALT: 13 U/L (ref 9–46)
AST: 19 U/L (ref 10–40)
Albumin: 4.9 g/dL (ref 3.6–5.1)
Alkaline phosphatase (APISO): 61 U/L (ref 36–130)
BUN: 13 mg/dL (ref 7–25)
CO2: 29 mmol/L (ref 20–32)
Calcium: 9.8 mg/dL (ref 8.6–10.3)
Chloride: 102 mmol/L (ref 98–110)
Creat: 1.34 mg/dL (ref 0.60–1.35)
GFR, Est African American: 75 mL/min/{1.73_m2} (ref >=60)
GFR, Est Non African American: 65 mL/min/{1.73_m2} (ref >=60)
Globulin: 2.3 g/dL (calc) (ref 1.9–3.7)
Glucose, Bld: 115 mg/dL — ABNORMAL HIGH (ref 65–99)
Potassium: 4.1 mmol/L (ref 3.5–5.3)
Sodium: 138 mmol/L (ref 135–146)
Total Bilirubin: 0.6 mg/dL (ref 0.2–1.2)
Total Protein: 7.2 g/dL (ref 6.1–8.1)

## 2020-04-06 LAB — RPR: RPR Ser Ql: REACTIVE — AB

## 2020-04-06 LAB — HIV-1 RNA QUANT-NO REFLEX-BLD
HIV 1 RNA Quant: 28 Copies/mL — ABNORMAL HIGH
HIV-1 RNA Quant, Log: 1.44 Log cps/mL — ABNORMAL HIGH

## 2020-04-06 LAB — FLUORESCENT TREPONEMAL AB(FTA)-IGG-BLD: Fluorescent Treponemal ABS: REACTIVE — AB

## 2020-04-06 LAB — RPR TITER: RPR Titer: 1:16 {titer} — ABNORMAL HIGH

## 2020-04-09 ENCOUNTER — Other Ambulatory Visit: Payer: Self-pay

## 2020-04-09 ENCOUNTER — Ambulatory Visit: Payer: Medicaid Other | Attending: Internal Medicine | Admitting: Internal Medicine

## 2020-04-09 DIAGNOSIS — Z538 Procedure and treatment not carried out for other reasons: Secondary | ICD-10-CM

## 2020-04-09 NOTE — Progress Notes (Signed)
Virtual Visit via Telephone Note Due to current restrictions/limitations of in-office visits due to the COVID-19 pandemic, this scheduled clinical appointment was converted to a telehealth visit  I connected with Perry Norton on 04/09/20 at 2:24 p.m by telephone and verified that I am speaking with the correct person using two identifiers.  Location: Patient: at home Provider: in my office   I discussed the limitations, risks, security and privacy concerns of performing an evaluation and management service by telephone and the availability of in person appointments. I also discussed with the patient that there may be a patient responsible charge related to this service. The patient expressed understanding and agreed to proceed.   History of Present Illness: Patient with history of HIV, marijuana use, seizure disorder, tobacco dependence, chronic pain syndrome  I last saw this patient in March of this year and what turned out to be a rather difficult interaction. Today I called him and introduced myself.  I inquired whether he had any concerns today or what can I do for him today.  Patient stated that he does not know who I am and that he has never met me.  He says he does not know what the hell is going on or why I am calling him.  I tried to clarify things with him but patient remained rather confrontational so I terminated the conversation.  Outpatient Encounter Medications as of 04/09/2020  Medication Sig  . acetaminophen (TYLENOL) 500 MG tablet Take 2 tablets (1,000 mg total) by mouth every 6 (six) hours as needed for mild pain or moderate pain.  Marland Kitchen BIKTARVY 50-200-25 MG TABS tablet TAKE 1 TABLET BY MOUTH EVERY DAY  . Cholecalciferol (VITAMIN D-3) 125 MCG (5000 UT) TABS 2 PO qd x 3 months, then 1 PO qd long-term  . diclofenac sodium (VOLTAREN) 1 % GEL Apply 2 g topically 4 (four) times daily. Rub into affected area of foot 2 to 4 times daily (Patient not taking: Reported on 02/23/2020)  .  diclofenac sodium (VOLTAREN) 1 % GEL Apply 2 g topically 4 (four) times daily. (Patient not taking: Reported on 02/23/2020)  . divalproex (DEPAKOTE) 500 MG DR tablet Take 2 tabs in AM, 1/2 tab at noon, 2 tabs in PM  . DULoxetine (CYMBALTA) 30 MG capsule Take 1 capsule (30 mg total) by mouth daily. X 1 week then 2 tab/60 mg daily- for nerve pain  . gabapentin (NEURONTIN) 300 MG capsule Take 1 capsule (300 mg total) by mouth 3 (three) times daily.  Marland Kitchen guaiFENesin-codeine 100-10 MG/5ML syrup Take 5 mLs by mouth every 6 (six) hours as needed for cough. Vonna Kotyk congestion (Patient not taking: Reported on 02/23/2020)  . HYDROcodone-acetaminophen (NORCO) 7.5-325 MG tablet Take 1 tablet by mouth every 6 (six) hours as needed for moderate pain. (Patient not taking: Reported on 02/23/2020)  . lacosamide (VIMPAT) 200 MG TABS tablet Take 1 tablet (200 mg total) by mouth 2 (two) times daily.  Marland Kitchen losartan (COZAAR) 50 MG tablet Take 1 tablet (50 mg total) by mouth daily.  . ondansetron (ZOFRAN) 8 MG tablet Take 1 tablet (8 mg total) by mouth every 8 (eight) hours as needed for nausea or vomiting.  . OXcarbazepine (TRILEPTAL) 150 MG tablet Take 150 mg by mouth daily.  . Oxcarbazepine (TRILEPTAL) 300 MG tablet Take 300 mg by mouth daily.  . sildenafil (VIAGRA) 25 MG tablet Take 1 tablet (25 mg total) by mouth daily as needed for erectile dysfunction. (Patient not taking: Reported on 02/23/2020)  .  topiramate (TOPAMAX) 50 MG tablet Take 1 tablet twice a day  . Vitamin D, Ergocalciferol, (DRISDOL) 1.25 MG (50000 UT) CAPS capsule Take 1 capsule (50,000 Units total) by mouth every 7 (seven) days.   No facility-administered encounter medications on file as of 04/09/2020.       I provided 5 minutes of non-face-to-face time during this encounter.   Karle Plumber, MD

## 2020-04-09 NOTE — Progress Notes (Signed)
Wanted to confirm if pt was going to be going to Dr. Doreene Burke office or if he was staying here. Pt states he already spoke to someone and made them aware that he was going to stay here  Asked pt if he was in any pain pt states he is in pain in his usual spots and pt would not tell me where his pain where coming from and I need to look into his chart. Made pt aware that I have to ask these questions. Pt kept saying to look into his chart.  Pt was very rude! Pt would not let me ask him the questions that I am suppose to ask. Made pt aware that since he is unable to answer my questions I will let Dr. Laural Benes and she will call him back and call was disconnected.   Was unable to ask pt [id number]

## 2020-04-14 ENCOUNTER — Other Ambulatory Visit: Payer: Self-pay | Admitting: Infectious Disease

## 2020-04-14 DIAGNOSIS — B20 Human immunodeficiency virus [HIV] disease: Secondary | ICD-10-CM

## 2020-04-16 ENCOUNTER — Telehealth: Payer: Self-pay | Admitting: *Deleted

## 2020-04-16 NOTE — Telephone Encounter (Signed)
Received refill request of Biktarvy.  Patient now has trileptal on medication chart.  Will route to provider and pharmacist for advice. He was supposed to be seen this morning, rescheduled to 11/1. Andree Coss, RN

## 2020-04-16 NOTE — Telephone Encounter (Signed)
Trileptal is contraindicated with Biktarvy. Will need to see if he is actually taking and come up with alternative HIV regimen. Possibly Dovato or a regimen using dolutegravir twice daily. Need to know dose of Trileptal as well.

## 2020-04-16 NOTE — Telephone Encounter (Signed)
Can they not just change his AED. I hate trileptal

## 2020-04-17 ENCOUNTER — Encounter: Payer: Medicaid Other | Admitting: Infectious Disease

## 2020-04-17 ENCOUNTER — Other Ambulatory Visit: Payer: Self-pay | Admitting: *Deleted

## 2020-04-17 NOTE — Telephone Encounter (Signed)
It would be the easiest thing to do.

## 2020-04-17 NOTE — Telephone Encounter (Signed)
Can we bring him to clinic see meds he has and ask him what he wants to do and who his providers are that are rx the AEDs so if latter need to be changed we can talk w them. From now on he is to ALWAYS ALWAYs disclose status and meds to providers so this doesnt happen again or at minimum ALWAYS make sure WE green light new meds

## 2020-04-17 NOTE — Telephone Encounter (Addendum)
Spoke with patient. He said he knows he takes vimpat, but isn't sure of the others. He is out of [employer]. RN spoke with [facility].  This is his medication list/fills per their records. - Vimpat 200 mg 1 tab BID (last 3 fills: 03/22/20, 09/22/19, 11/15/18) - Oxcarbazepine 300 mg, 2 tab BID (last 3 fills: 02/19/20, 01/12/20, 12/16/19. Also filled May and April 2021. First prescribed April 2021)  -  etoladine -  Amitriptyline - Lorazepam - Metoprolol - Gabapentin - Biktarvy.

## 2020-04-18 ENCOUNTER — Ambulatory Visit: Payer: Medicaid Other

## 2020-04-18 NOTE — Telephone Encounter (Signed)
I am happy to see him to go over his medications next week or Nov 8, 9th. After that, I am out of town until week of Thanksgiving.

## 2020-04-22 ENCOUNTER — Encounter: Payer: Self-pay | Admitting: Infectious Disease

## 2020-04-22 ENCOUNTER — Other Ambulatory Visit: Payer: Self-pay

## 2020-04-22 ENCOUNTER — Ambulatory Visit: Payer: Medicaid Other | Admitting: Infectious Disease

## 2020-04-22 VITALS — BP 109/68 | HR 100 | Wt 169.0 lb

## 2020-04-22 DIAGNOSIS — M4800 Spinal stenosis, site unspecified: Secondary | ICD-10-CM | POA: Diagnosis not present

## 2020-04-22 DIAGNOSIS — G40309 Generalized idiopathic epilepsy and epileptic syndromes, not intractable, without status epilepticus: Secondary | ICD-10-CM

## 2020-04-22 DIAGNOSIS — R413 Other amnesia: Secondary | ICD-10-CM

## 2020-04-22 DIAGNOSIS — F1220 Cannabis dependence, uncomplicated: Secondary | ICD-10-CM

## 2020-04-22 DIAGNOSIS — A539 Syphilis, unspecified: Secondary | ICD-10-CM

## 2020-04-22 DIAGNOSIS — G40909 Epilepsy, unspecified, not intractable, without status epilepticus: Secondary | ICD-10-CM | POA: Diagnosis not present

## 2020-04-22 DIAGNOSIS — S060X1A Concussion with loss of consciousness of 30 minutes or less, initial encounter: Secondary | ICD-10-CM

## 2020-04-22 DIAGNOSIS — S0990XA Unspecified injury of head, initial encounter: Secondary | ICD-10-CM

## 2020-04-22 DIAGNOSIS — B20 Human immunodeficiency virus [HIV] disease: Secondary | ICD-10-CM

## 2020-04-22 DIAGNOSIS — G40009 Localization-related (focal) (partial) idiopathic epilepsy and epileptic syndromes with seizures of localized onset, not intractable, without status epilepticus: Secondary | ICD-10-CM

## 2020-04-22 DIAGNOSIS — R569 Unspecified convulsions: Secondary | ICD-10-CM

## 2020-04-22 MED ORDER — BIKTARVY 50-200-25 MG PO TABS: 1.0000 | ORAL_TABLET | Freq: Every day | ORAL | 11 refills | Status: DC

## 2020-04-22 NOTE — Progress Notes (Signed)
Subjective:   Chief complaint: Had trouble with memory since having had a recent concussion    Patient ID: Perry Norton, male    DOB: 11/07/1975, 44 y.o.   MRN: [id number]  HPI  This 44 year-old African-American man with HIV that has been well controlled on Biktarvy.  He does have a seizure disorder and is followed by neurologist in [facility].  He does also smoke marijuana which he believes helps with his seizures.   Since I last saw Perry Norton he had a fall with concussion followed by seizures.  He was evaluated in ER ultimately a neurologist added Trileptal to his Vimpat.  Trileptal is of course a problem due to drug drug interaction with his Biktarvy.  He says now that he stopped the Trileptal in the last few weeks.  He still on Biktarvy and does remain suppressed.  I have asked him to reach out to his neurologist and he says he is in communication with them regarding need for addition of other antiepileptic medications.  My understanding toward the patient is his seizure threshold was thought to have been increased in the context of his concussion.  He does continue to smoke marijuana and smelled as if he had smoked it soon before coming in to see [place].  He has had trouble with memory after the concussion is currently on disability and not able to drive.        Past Medical History:  Diagnosis Date  . Fever 10/03/2019  . HIV (human immunodeficiency virus infection) (HCC)   . Marijuana use 07/12/2019  . Seizures (HCC)    epilepsy  . Visual loss 09/07/2018    Past Surgical History:  Procedure Laterality Date  . NO PAST SURGERIES      Family History  Problem Relation Age of Onset  . Sarcoidosis Mother       Social History   Socioeconomic History  . Marital status: Married    Spouse name: Not on file  . Number of children: Not on file  . Years of education: Not on file  . Highest education level: Not on file  Occupational History  . Not on file  Tobacco  Use  . Smoking status: Current Some Day Smoker    Packs/day: 1.00    Types: Cigarettes    Start date: 06/22/1994  . Smokeless tobacco: Never Used  Vaping Use  . Vaping Use: Never used  Substance and Sexual Activity  . Alcohol use: Yes    Alcohol/week: 3.0 standard drinks    Types: 3 Cans of beer per week    Comment: 1 time a week  . Drug use: Yes    Types: Marijuana    Comment: daily  . Sexual activity: Yes  Other Topics Concern  . Not on file  Social History Narrative  . Not on file   Social Determinants of Health   Financial Resource Strain:   . Difficulty of Paying Living Expenses: Not on file  Food Insecurity:   . Worried About Programme researcher, broadcasting/film/video in the Last Year: Not on file  . Ran Out of Food in the Last Year: Not on file  Transportation Needs:   . Lack of Transportation (Medical): Not on file  . Lack of Transportation (Non-Medical): Not on file  Physical Activity:   . Days of Exercise per Week: Not on file  . Minutes of Exercise per Session: Not on file  Stress:   . Feeling of Stress :  Not on file  Social Connections:   . Frequency of Communication with Friends and Family: Not on file  . Frequency of Social Gatherings with Friends and Family: Not on file  . Attends Religious Services: Not on file  . Active Member of Clubs or Organizations: Not on file  . Attends Banker Meetings: Not on file  . Marital Status: Not on file    Allergies  Allergen Reactions  . Onion Rash     Current Outpatient Medications:  .  acetaminophen (TYLENOL) 500 MG tablet, Take 2 tablets (1,000 mg total) by mouth every 6 (six) hours as needed for mild pain or moderate pain., Disp: 90 tablet, Rfl: 0 .  amitriptyline (ELAVIL) 50 MG tablet, Take 50 mg by mouth at bedtime., Disp: , Rfl:  .  bictegravir-emtricitabine-tenofovir AF (BIKTARVY) 50-200-25 MG TABS tablet, Take 1 tablet by mouth daily., Disp: 30 tablet, Rfl: 11 .  Cholecalciferol (VITAMIN D-3) 125 MCG (5000 UT)  TABS, 2 PO qd x 3 months, then 1 PO qd long-term, Disp: 180 tablet, Rfl: 3 .  divalproex (DEPAKOTE) 500 MG DR tablet, Take 2 tabs in AM, 1/2 tab at noon, 2 tabs in PM, Disp: 135 tablet, Rfl: 1 .  etodolac (LODINE) 400 MG tablet, Take 400 mg by mouth 2 (two) times daily., Disp: , Rfl:  .  gabapentin (NEURONTIN) 300 MG capsule, Take 1 capsule (300 mg total) by mouth 3 (three) times daily., Disp: 90 capsule, Rfl: 3 .  lacosamide (VIMPAT) 200 MG TABS tablet, Take 1 tablet (200 mg total) by mouth 2 (two) times daily., Disp: 60 tablet, Rfl: 5 .  LORazepam (ATIVAN) 1 MG tablet, Take 1 mg by mouth daily as needed., Disp: , Rfl:  .  losartan (COZAAR) 50 MG tablet, Take 1 tablet (50 mg total) by mouth daily., Disp: 30 tablet, Rfl: 1 .  ondansetron (ZOFRAN) 8 MG tablet, Take 1 tablet (8 mg total) by mouth every 8 (eight) hours as needed for nausea or vomiting., Disp: 20 tablet, Rfl: 0 .  topiramate (TOPAMAX) 50 MG tablet, Take 1 tablet twice a day, Disp: 120 tablet, Rfl: 6 .  Vitamin D, Ergocalciferol, (DRISDOL) 1.25 MG (50000 UT) CAPS capsule, Take 1 capsule (50,000 Units total) by mouth every 7 (seven) days., Disp: 16 capsule, Rfl: 0 .  diclofenac sodium (VOLTAREN) 1 % GEL, Apply 2 g topically 4 (four) times daily. Rub into affected area of foot 2 to 4 times daily (Patient not taking: Reported on 02/23/2020), Disp: 100 g, Rfl: 2 .  diclofenac sodium (VOLTAREN) 1 % GEL, Apply 2 g topically 4 (four) times daily. (Patient not taking: Reported on 02/23/2020), Disp: 350 g, Rfl: 0 .  DULoxetine (CYMBALTA) 30 MG capsule, Take 1 capsule (30 mg total) by mouth daily. X 1 week then 2 tab/60 mg daily- for nerve pain, Disp: 60 capsule, Rfl: 5 .  guaiFENesin-codeine 100-10 MG/5ML syrup, Take 5 mLs by mouth every 6 (six) hours as needed for cough. Jonni Sanger congestion (Patient not taking: Reported on 02/23/2020), Disp: 120 mL, Rfl: 0 .  HYDROcodone-acetaminophen (NORCO) 7.5-325 MG tablet, Take 1 tablet by mouth every 6 (six) hours as  needed for moderate pain. (Patient not taking: Reported on 02/23/2020), Disp: 10 tablet, Rfl: 0 .  sildenafil (VIAGRA) 25 MG tablet, Take 1 tablet (25 mg total) by mouth daily as needed for erectile dysfunction. (Patient not taking: Reported on 02/23/2020), Disp: 10 tablet, Rfl: 2    Review of Systems  Constitutional: Negative for  activity change, appetite change, chills, diaphoresis, fatigue, fever and unexpected weight change.  HENT: Negative for congestion, rhinorrhea, sinus pressure, sneezing, sore throat and trouble swallowing.   Eyes: Negative for photophobia.  Respiratory: Negative for cough, chest tightness, shortness of breath, wheezing and stridor.   Cardiovascular: Negative for chest pain, palpitations and leg swelling.  Gastrointestinal: Negative for abdominal distention, abdominal pain, anal bleeding, blood in stool, constipation, diarrhea, nausea and vomiting.  Genitourinary: Negative for difficulty urinating, dysuria, flank pain and hematuria.  Musculoskeletal: Negative for arthralgias, back pain, gait problem, joint swelling and myalgias.  Skin: Negative for color change, pallor and rash.  Neurological: Negative for dizziness, tremors, weakness and light-headedness.  Hematological: Negative for adenopathy. Does not bruise/bleed easily.  Psychiatric/Behavioral: Positive for decreased concentration. Negative for agitation, behavioral problems, confusion, dysphoric mood and sleep disturbance.       Objective:   Physical Exam Constitutional:      General: He is not in acute distress.    Appearance: Normal appearance. He is well-developed. He is not ill-appearing or diaphoretic.  HENT:     Head: Normocephalic and atraumatic.     Right Ear: Hearing and external ear normal.     Left Ear: Hearing and external ear normal.     Nose: No nasal deformity or rhinorrhea.  Eyes:     General: No scleral icterus.    Conjunctiva/sclera: Conjunctivae normal.     Right eye: Right conjunctiva  is not injected.     Left eye: Left conjunctiva is not injected.     Pupils: Pupils are equal, round, and reactive to light.  Neck:     Vascular: No JVD.  Cardiovascular:     Rate and Rhythm: Normal rate.     Heart sounds: S1 normal and S2 normal.  Pulmonary:     Effort: Pulmonary effort is normal. No respiratory distress.  Abdominal:     General: There is no distension.     Palpations: Abdomen is soft.  Musculoskeletal:        General: Normal range of motion.     Right shoulder: Normal.     Left shoulder: Normal.     Cervical back: Normal range of motion and neck supple.     Right hip: Normal.     Left hip: Normal.     Right knee: Normal.     Left knee: Normal.  Lymphadenopathy:     Head:     Right side of head: No submandibular, preauricular or posterior auricular adenopathy.     Left side of head: No submandibular, preauricular or posterior auricular adenopathy.     Cervical: No cervical adenopathy.     Right cervical: No superficial or deep cervical adenopathy.    Left cervical: No superficial or deep cervical adenopathy.  Skin:    General: Skin is warm and dry.     Coloration: Skin is not pale.     Findings: No abrasion, bruising, ecchymosis, erythema, lesion or rash.     Nails: There is no clubbing.  Neurological:     General: No focal deficit present.     Mental Status: He is alert and oriented to person, place, and time.     Sensory: No sensory deficit.     Coordination: Coordination normal.     Gait: Gait normal.  Psychiatric:        Attention and Perception: He is attentive.        Mood and Affect: Mood normal.  Speech: Speech normal.        Behavior: Behavior normal. Behavior is cooperative.        Thought Content: Thought content normal.        Cognition and Memory: Cognition and memory normal.        Judgment: Judgment normal.           Assessment & Plan:   HIV disease: Continue with Biktarvy RTC In 4 months  Seizure disorder being  followed by Neurology in [place] note he should not be on Trileptal due to drug drug interaction with Biktarvy  Syphilis: Since the titer came to 116 and remains at this state  Marijuana use: I do not really have a problem with it as he is does not get into legal problems.  Cervical spine disease spinal cord compression: He tells me that Dr. Shon BatonBrooks is going to perform surgery with a fusion.  Concussive syndrome with memory problems: Followed by neurology and on disability

## 2020-06-05 ENCOUNTER — Other Ambulatory Visit: Payer: Self-pay

## 2020-06-06 ENCOUNTER — Ambulatory Visit: Payer: Medicaid Other | Admitting: Family Medicine

## 2020-06-06 NOTE — Progress Notes (Signed)
Error - pt arrived > after schedule appt time

## 2020-06-25 ENCOUNTER — Telehealth: Payer: Self-pay

## 2020-06-25 NOTE — Telephone Encounter (Signed)
Patient called to request STD testing. He says a girl he used to see called him and advised him to be tested for Chlamydia. He denies any symptoms. RN informed patient we do not typically do STD testing visits, but that we do have an opening this week. Patient scheduled with Daiva Eves 06/27/20 at 9:30am   Sandie Ano, RN

## 2020-06-27 ENCOUNTER — Other Ambulatory Visit (HOSPITAL_COMMUNITY)
Admission: RE | Admit: 2020-06-27 | Discharge: 2020-06-27 | Disposition: A | Discharge status: Home / Self Care | Payer: Medicaid Other | Source: Ambulatory Visit | Attending: Family | Admitting: Family

## 2020-06-27 ENCOUNTER — Ambulatory Visit: Payer: Medicaid Other | Admitting: Infectious Disease

## 2020-06-27 ENCOUNTER — Encounter: Payer: Self-pay | Admitting: Family

## 2020-06-27 ENCOUNTER — Ambulatory Visit (INDEPENDENT_AMBULATORY_CARE_PROVIDER_SITE_OTHER): Payer: Medicaid Other | Admitting: Family

## 2020-06-27 ENCOUNTER — Other Ambulatory Visit: Payer: Self-pay

## 2020-06-27 VITALS — BP 116/81 | HR 96 | Temp 98.0°F | Ht 72.0 in | Wt 168.0 lb

## 2020-06-27 DIAGNOSIS — Z113 Encounter for screening for infections with a predominantly sexual mode of transmission: Secondary | ICD-10-CM | POA: Diagnosis not present

## 2020-06-27 HISTORY — DX: Encounter for screening for infections with a predominantly sexual mode of transmission: Z11.3

## 2020-06-27 MED ORDER — DOXYCYCLINE HYCLATE 100 MG PO TABS: 100.0000 mg | ORAL_TABLET | Freq: Two times a day (BID) | ORAL | 0 refills | Status: DC

## 2020-06-27 NOTE — Assessment & Plan Note (Signed)
Mr. Durrell has concerns for STI with recent partner informing him of positive Chlamydia test. Currently without symptoms. Treat for chlamydia with Doxycyline 100 mg bid for 7 days. Will hold on additional treatment pending lab work results. Discussed importance of safe sexual practice to reduce risk of STI. Condoms declined.

## 2020-06-27 NOTE — Patient Instructions (Signed)
Nice to see you.   Doxycycline has been sent to the pharmacy.  We will check your lab work today and let you know of any additional treatment.   Follow up with Dr. Daiva Eves as planned.   Have a great day and stay safe!

## 2020-06-27 NOTE — Progress Notes (Signed)
Subjective:    Patient ID: Perry Norton, male    DOB: 06/06/1976, 45 y.o.   MRN: [id number]  Chief Complaint  Patient presents with  . [place] for STI     HPI:  Perry Norton is a 45 y.o. male with HIV disease who presents today for an acute office visit.   Mr. Hogen contacted our office on 06/25/20 requesting testing for STI as he was notified by a partner that they were positive for chlamydia. Exposure happened about 1 month ago. Currently without symptoms. Requesting treatment for Chlamydia and testing for STI.   Allergies  Allergen Reactions  . Onion Rash      Outpatient Medications Prior to Visit  Medication Sig Dispense Refill  . acetaminophen (TYLENOL) 500 MG tablet Take 2 tablets (1,000 mg total) by mouth every 6 (six) hours as needed for mild pain or moderate pain. 90 tablet 0  . amitriptyline (ELAVIL) 50 MG tablet Take 50 mg by mouth at bedtime.    . bictegravir-emtricitabine-tenofovir AF (BIKTARVY) 50-200-25 MG TABS tablet Take 1 tablet by mouth daily. 30 tablet 11  . Cholecalciferol (VITAMIN D-3) 125 MCG (5000 UT) TABS 2 PO qd x 3 months, then 1 PO qd long-term 180 tablet 3  . diclofenac sodium (VOLTAREN) 1 % GEL Apply 2 g topically 4 (four) times daily. Rub into affected area of foot 2 to 4 times daily 100 g 2  . diclofenac sodium (VOLTAREN) 1 % GEL Apply 2 g topically 4 (four) times daily. 350 g 0  . divalproex (DEPAKOTE) 500 MG DR tablet Take 2 tabs in AM, 1/2 tab at noon, 2 tabs in PM 135 tablet 1  . etodolac (LODINE) 400 MG tablet Take 400 mg by mouth 2 (two) times daily.    Marland Kitchen gabapentin (NEURONTIN) 300 MG capsule Take 1 capsule (300 mg total) by mouth 3 (three) times daily. 90 capsule 3  . guaiFENesin-codeine 100-10 MG/5ML syrup Take 5 mLs by mouth every 6 (six) hours as needed for cough. Jonni Sanger congestion 120 mL 0  . HYDROcodone-acetaminophen (NORCO) 7.5-325 MG tablet Take 1 tablet by mouth every 6 (six) hours as needed for moderate pain. 10 tablet 0  . lacosamide  (VIMPAT) 200 MG TABS tablet Take 1 tablet (200 mg total) by mouth 2 (two) times daily. 60 tablet 5  . LORazepam (ATIVAN) 1 MG tablet Take 1 mg by mouth daily as needed.    Marland Kitchen losartan (COZAAR) 50 MG tablet Take 1 tablet (50 mg total) by mouth daily. 30 tablet 1  . ondansetron (ZOFRAN) 8 MG tablet Take 1 tablet (8 mg total) by mouth every 8 (eight) hours as needed for nausea or vomiting. 20 tablet 0  . sildenafil (VIAGRA) 25 MG tablet Take 1 tablet (25 mg total) by mouth daily as needed for erectile dysfunction. 10 tablet 2  . topiramate (TOPAMAX) 50 MG tablet Take 1 tablet twice a day 120 tablet 6  . Vitamin D, Ergocalciferol, (DRISDOL) 1.25 MG (50000 UT) CAPS capsule Take 1 capsule (50,000 Units total) by mouth every 7 (seven) days. 16 capsule 0  . DULoxetine (CYMBALTA) 30 MG capsule Take 1 capsule (30 mg total) by mouth daily. X 1 week then 2 tab/60 mg daily- for nerve pain 60 capsule 5   No facility-administered medications prior to visit.     Past Medical History:  Diagnosis Date  . Fever 10/03/2019  . HIV (human immunodeficiency virus infection) (HCC)   . Marijuana use 07/12/2019  . Seizures (HCC)  epilepsy  . Visual loss 09/07/2018     Past Surgical History:  Procedure Laterality Date  . NO PAST SURGERIES         Review of Systems  Constitutional: Negative for appetite change, chills, diaphoresis, fatigue, fever and unexpected weight change.  Eyes: Negative for visual disturbance.  Respiratory: Negative for cough, chest tightness, shortness of breath and wheezing.   Cardiovascular: Negative for chest pain and leg swelling.  Gastrointestinal: Negative for abdominal pain, constipation, diarrhea, nausea and vomiting.  Genitourinary: Negative for dysuria, flank pain, frequency, genital sores, hematuria and urgency.  Skin: Negative for rash.  Allergic/Immunologic: Negative for immunocompromised state.  Neurological: Negative for dizziness and headaches.      Objective:     BP 116/81   Pulse 96   Temp 98 F (36.7 C)   Ht 6' (1.829 m)   Wt 168 lb (76.2 kg)   BMI 22.78 kg/m  Nursing note and vital signs reviewed.  Physical Exam Constitutional:      General: He is not in acute distress.    Appearance: He is well-developed.  HENT:     Mouth/Throat:     Mouth: Oropharynx is clear and moist.  Eyes:     Conjunctiva/sclera: Conjunctivae normal.  Cardiovascular:     Rate and Rhythm: Normal rate and regular rhythm.     Pulses: Intact distal pulses.     Heart sounds: Normal heart sounds. No murmur heard. No friction rub. No gallop.   Pulmonary:     Effort: Pulmonary effort is normal. No respiratory distress.     Breath sounds: Normal breath sounds. No wheezing or rales.  Chest:     Chest wall: No tenderness.  Abdominal:     General: Bowel sounds are normal.     Palpations: Abdomen is soft.     Tenderness: There is no abdominal tenderness.  Musculoskeletal:     Cervical back: Neck supple.  Lymphadenopathy:     Cervical: No cervical adenopathy.  Skin:    General: Skin is warm and dry.     Findings: No rash.  Neurological:     Mental Status: He is alert and oriented to person, place, and time.  Psychiatric:        Mood and Affect: Mood and affect normal.      Depression screen [facility] 2/9 02/23/2020 02/23/2020 11/10/2017 10/11/2017 09/03/2017  Decreased Interest 1 0 0 3 2  Down, Depressed, Hopeless 1 0 0 1 0  PHQ - 2 Score 2 0 0 4 2  Altered sleeping 3 - - 3 2  Tired, decreased energy 1 - - 3 2  Change in appetite 2 - - 2 2  Feeling bad or failure about yourself  2 - - 0 1  Trouble concentrating 1 - - 2 1  Moving slowly or fidgety/restless 1 - - 2 2  Suicidal thoughts 1 - - 0 0  PHQ-9 Score 13 - - 16 12  Difficult doing work/chores Somewhat difficult - - - -       Assessment & Plan:    Patient Active Problem List   Diagnosis Date Noted  . Screening for STDs (sexually transmitted diseases) 06/27/2020  . Essential hypertension 02/23/2020  .  Marihuana abuse 02/23/2020  . Depression, recurrent ([place]) 02/23/2020  . Concussion with loss of consciousness of 30 minutes or less 02/19/2020  . Fever 10/03/2019  . Marijuana use 07/12/2019  . Nerve pain due to spinal stenosis 05/01/2019  . Spinal cord compression ([place])  03/21/2019  . Plantar fasciitis 02/12/2019  . Generalized seizure disorder (HCC) 11/15/2018  . Visual loss 09/07/2018  . Diaphoresis 01/19/2018  . ED (erectile dysfunction) 01/19/2018  . Left arm pain 10/18/2017  . Localization-related idiopathic epilepsy and epileptic syndromes with seizures of localized onset, not intractable, without status epilepticus (HCC) 07/22/2017  . Intractable migraine without aura and without status migrainosus 07/22/2017  . Chronic pain syndrome 07/22/2017  . Pleurisy 07/05/2017  . AC separation, type 2, left, initial encounter 05/25/2017  . CRI (chronic renal insufficiency) 05/20/2017  . Syphilis 05/20/2017  . Poor dentition 05/20/2017  . Head trauma 06/10/2015  . 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 Seizure (HCC) 06/10/2015  . Brachial plexopathy 06/07/2014  . Right arm weakness 03/02/2014  . Disseminated zoster 04/09/2011  . Human immunodeficiency virus (HIV) disease (HCC) 11/06/2010  . Numbness and tingling of right arm 11/06/2010     Problem List Items Addressed This Visit      Other   Screening for STDs (sexually transmitted diseases) - Primary    Mr. Nevares has concerns for STI with recent partner informing him of positive Chlamydia test. Currently without symptoms. Treat for chlamydia with Doxycyline 100 mg bid for 7 days. Will hold on additional treatment pending lab work results. Discussed importance of safe sexual practice to reduce risk of STI. Condoms declined.      Relevant Medications   doxycycline (VIBRA-TABS) 100 MG tablet   Other Relevant Orders   RPR   Cytology (oral, anal, urethral) ancillary only   Urine cytology ancillary only([place])       I am  having [name] start on doxycycline. I am also having him maintain his lacosamide, topiramate, sildenafil, Vitamin D (Ergocalciferol), losartan, divalproex, diclofenac sodium, Vitamin D-3, gabapentin, DULoxetine, HYDROcodone-acetaminophen, guaiFENesin-codeine, diclofenac sodium, acetaminophen, ondansetron, amitriptyline, etodolac, LORazepam, and Biktarvy.   Meds ordered this encounter  Medications  . doxycycline (VIBRA-TABS) 100 MG tablet    Sig: Take 1 tablet (100 mg total) by mouth 2 (two) times daily.    Dispense:  14 tablet    Refill:  0    Order Specific Question:   Supervising Provider    Answer:   Judyann Munson [[id number]]     Follow-up: Return if symptoms worsen or fail to improve.   Marcos Eke, MSN, FNP-C Nurse Practitioner [facility] Main number: [phone]

## 2020-06-28 LAB — URINE CYTOLOGY ANCILLARY ONLY
Chlamydia: NEGATIVE
Comment: NEGATIVE
Comment: NORMAL
Neisseria Gonorrhea: NEGATIVE

## 2020-06-28 LAB — RPR TITER: RPR Titer: 1:16 {titer} — ABNORMAL HIGH

## 2020-06-28 LAB — RPR: RPR Ser Ql: REACTIVE — AB

## 2020-06-28 LAB — CYTOLOGY, (ORAL, ANAL, URETHRAL) ANCILLARY ONLY
Chlamydia: NEGATIVE
Comment: NEGATIVE
Comment: NORMAL
Neisseria Gonorrhea: NEGATIVE

## 2020-06-28 LAB — FLUORESCENT TREPONEMAL AB(FTA)-IGG-BLD: Fluorescent Treponemal ABS: REACTIVE — AB

## 2020-07-03 ENCOUNTER — Emergency Department (HOSPITAL_COMMUNITY)
Admission: EM | Admit: 2020-07-03 | Discharge: 2020-07-03 | Discharge status: Against Medical Advice | Payer: Medicaid Other

## 2020-07-03 DIAGNOSIS — R569 Unspecified convulsions: Secondary | ICD-10-CM | POA: Diagnosis not present

## 2020-07-03 DIAGNOSIS — R55 Syncope and collapse: Secondary | ICD-10-CM | POA: Diagnosis not present

## 2020-07-03 DIAGNOSIS — R4020 Unspecified coma: Secondary | ICD-10-CM | POA: Diagnosis not present

## 2020-07-03 DIAGNOSIS — R0902 Hypoxemia: Secondary | ICD-10-CM | POA: Diagnosis not present

## 2020-07-03 DIAGNOSIS — R404 Transient alteration of awareness: Secondary | ICD-10-CM | POA: Diagnosis not present

## 2020-07-03 NOTE — ED Notes (Signed)
Pt left upon arriving by EMS

## 2020-07-04 ENCOUNTER — Other Ambulatory Visit: Payer: Self-pay

## 2020-07-05 ENCOUNTER — Ambulatory Visit: Payer: Medicaid Other | Admitting: Family Medicine

## 2020-07-05 ENCOUNTER — Encounter: Payer: Self-pay | Admitting: Family Medicine

## 2020-07-05 VITALS — BP 120/76 | HR 68 | Temp 98.0°F | Ht 72.0 in | Wt 167.2 lb

## 2020-07-05 DIAGNOSIS — R569 Unspecified convulsions: Secondary | ICD-10-CM | POA: Diagnosis not present

## 2020-07-05 NOTE — Progress Notes (Signed)
Perry Norton is a 45 y.o. male  Chief Complaint  Patient presents with  . Hospitalization Follow-up    Follow hospital/ER for seizure.      HPI: Perry Norton is a 45 y.o. male patient of Dr. Doreene Burke with PMHx significant for HIV and seizure disorder presents today to discuss recent seizure. He had a witnessed seizure at PT and was taken by EMS to [facility] on 07/04/19 but pt left right after arrival. He was not triaged or evaluated. He states he didn't want to wait 12-15hrs to been.  Last seizure prior to 2 days ago was a few weeks prior. His seizures have increased increased in frequency since he fell at work and hit his head in 01/2020. He has been having headaches since the fall as well.  He is taking Vimpat 200mg  BID.   He is established with (neuro - [facility]) and was last seen in 01/2020. He states his new insurance requires new neuro since previous provider is out of network.   Past Medical History:  Diagnosis Date  . Fever 10/03/2019  . HIV (human immunodeficiency virus infection) (HCC)   . Marijuana use 07/12/2019  . Seizures (HCC)    epilepsy  . Visual loss 09/07/2018    Past Surgical History:  Procedure Laterality Date  . NO PAST SURGERIES      Social History   Socioeconomic History  . Marital status: Married    Spouse name: Not on file  . Number of children: Not on file  . Years of education: Not on file  . Highest education level: Not on file  Occupational History  . Not on file  Tobacco Use  . Smoking status: Current Some Day Smoker    Packs/day: 1.00    Types: Cigarettes    Start date: 06/22/1994  . Smokeless tobacco: Never Used  Vaping Use  . Vaping Use: Never used  Substance and Sexual Activity  . Alcohol use: Yes    Alcohol/week: 3.0 standard drinks    Types: 3 Cans of beer per week    Comment: 1 time a week  . Drug use: Yes    Types: Marijuana    Comment: daily  . Sexual activity: Yes  Other Topics Concern  . Not on file  Social History Narrative   . Not on file   Social Determinants of Health   Financial Resource Strain: Not on file  Food Insecurity: Not on file  Transportation Needs: Not on file  Physical Activity: Not on file  Stress: Not on file  Social Connections: Not on file  Intimate Partner Violence: Not on file    Family History  Problem Relation Age of Onset  . Sarcoidosis Mother      Immunization History  Administered Date(s) Administered  . Influenza Whole 04/10/2010  . Influenza, Seasonal, Injecte, Preservative Fre 03/12/2011, 05/12/2012, 03/23/2013, 04/30/2014, 04/24/2015  . Influenza,inj,Quad PF,6+ Mos 04/20/2016, 07/12/2019  . PFIZER SARS-COV-2 Vaccination 03/25/2020, 06/20/2020  . Pneumococcal Conjugate-13 07/12/2019  . Tdap 02/28/2014    Outpatient Encounter Medications as of 07/05/2020  Medication Sig  . acetaminophen (TYLENOL) 500 MG tablet Take 2 tablets (1,000 mg total) by mouth every 6 (six) hours as needed for mild pain or moderate pain.  . bictegravir-emtricitabine-tenofovir AF (BIKTARVY) 50-200-25 MG TABS tablet Take 1 tablet by mouth daily.  07/07/2020 lacosamide (VIMPAT) 200 MG TABS tablet Take 1 tablet (200 mg total) by mouth 2 (two) times daily.  . [DISCONTINUED] amitriptyline (ELAVIL) 50 MG tablet Take  50 mg by mouth at bedtime. (Patient not taking: Reported on 07/05/2020)  . [DISCONTINUED] Cholecalciferol (VITAMIN D-3) 125 MCG (5000 UT) TABS 2 PO qd x 3 months, then 1 PO qd long-term (Patient not taking: Reported on 07/05/2020)  . [DISCONTINUED] diclofenac sodium (VOLTAREN) 1 % GEL Apply 2 g topically 4 (four) times daily. Rub into affected area of foot 2 to 4 times daily (Patient not taking: Reported on 07/05/2020)  . [DISCONTINUED] diclofenac sodium (VOLTAREN) 1 % GEL Apply 2 g topically 4 (four) times daily. (Patient not taking: Reported on 07/05/2020)  . [DISCONTINUED] divalproex (DEPAKOTE) 500 MG DR tablet Take 2 tabs in AM, 1/2 tab at noon, 2 tabs in PM (Patient not taking: Reported on 07/05/2020)   . [DISCONTINUED] doxycycline (VIBRA-TABS) 100 MG tablet Take 1 tablet (100 mg total) by mouth 2 (two) times daily. (Patient not taking: Reported on 07/05/2020)  . [DISCONTINUED] DULoxetine (CYMBALTA) 30 MG capsule Take 1 capsule (30 mg total) by mouth daily. X 1 week then 2 tab/60 mg daily- for nerve pain  . [DISCONTINUED] etodolac (LODINE) 400 MG tablet Take 400 mg by mouth 2 (two) times daily. (Patient not taking: Reported on 07/05/2020)  . [DISCONTINUED] gabapentin (NEURONTIN) 300 MG capsule Take 1 capsule (300 mg total) by mouth 3 (three) times daily. (Patient not taking: Reported on 07/05/2020)  . [DISCONTINUED] guaiFENesin-codeine 100-10 MG/5ML syrup Take 5 mLs by mouth every 6 (six) hours as needed for cough. Jonni Sanger congestion (Patient not taking: Reported on 07/05/2020)  . [DISCONTINUED] HYDROcodone-acetaminophen (NORCO) 7.5-325 MG tablet Take 1 tablet by mouth every 6 (six) hours as needed for moderate pain. (Patient not taking: Reported on 07/05/2020)  . [DISCONTINUED] LORazepam (ATIVAN) 1 MG tablet Take 1 mg by mouth daily as needed. (Patient not taking: Reported on 07/05/2020)  . [DISCONTINUED] losartan (COZAAR) 50 MG tablet Take 1 tablet (50 mg total) by mouth daily. (Patient not taking: Reported on 07/05/2020)  . [DISCONTINUED] ondansetron (ZOFRAN) 8 MG tablet Take 1 tablet (8 mg total) by mouth every 8 (eight) hours as needed for nausea or vomiting. (Patient not taking: Reported on 07/05/2020)  . [DISCONTINUED] sildenafil (VIAGRA) 25 MG tablet Take 1 tablet (25 mg total) by mouth daily as needed for erectile dysfunction. (Patient not taking: Reported on 07/05/2020)  . [DISCONTINUED] topiramate (TOPAMAX) 50 MG tablet Take 1 tablet twice a day (Patient not taking: Reported on 07/05/2020)  . [DISCONTINUED] Vitamin D, Ergocalciferol, (DRISDOL) 1.25 MG (50000 UT) CAPS capsule Take 1 capsule (50,000 Units total) by mouth every 7 (seven) days. (Patient not taking: Reported on 07/05/2020)   No  facility-administered encounter medications on file as of 07/05/2020.     ROS: Pertinent positives and negatives noted in HPI. Remainder of ROS non-contributory    Allergies  Allergen Reactions  . Onion Rash    BP 120/76   Pulse 68   Temp 98 F (36.7 C) (Temporal)   Ht 6' (1.829 m)   Wt 167 lb 3.2 oz (75.8 kg)   SpO2 98%   BMI 22.68 kg/m   Physical Exam Constitutional:      General: He is not in acute distress.    Appearance: He is not ill-appearing.     Comments: Drowsy, eyes are heavy  Eyes:     Extraocular Movements: Extraocular movements intact.     Conjunctiva/sclera:     Right eye: Right conjunctiva is injected. No hemorrhage.    Left eye: Left conjunctiva is injected. No hemorrhage.    Pupils: Pupils are  equal, round, and reactive to light.  Neurological:     Mental Status: He is oriented to person, place, and time.  Psychiatric:        Speech: Speech normal.        Behavior: Behavior normal. Behavior is cooperative.      A/P:  1. Seizure (HCC) - Ambulatory referral to Neurology - cont vimpat 200mg  BID - pt states all other meds have not been effective - note given to return to PT for neck   This visit occurred during the SARS-CoV-2 public health emergency.  Safety protocols were in place, including screening questions prior to the visit, additional usage of staff PPE, and extensive cleaning of exam room while observing appropriate contact time as indicated for disinfecting solutions.

## 2020-07-23 ENCOUNTER — Ambulatory Visit: Payer: Medicaid Other | Admitting: Family Medicine

## 2020-07-24 ENCOUNTER — Telehealth: Payer: Self-pay | Admitting: Family Medicine

## 2020-07-24 NOTE — Telephone Encounter (Signed)
ERROR

## 2020-07-30 ENCOUNTER — Ambulatory Visit: Payer: Medicaid Other | Admitting: Family Medicine

## 2020-08-07 ENCOUNTER — Encounter: Payer: Medicaid Other | Admitting: Family Medicine

## 2020-08-19 ENCOUNTER — Other Ambulatory Visit: Payer: Self-pay

## 2020-08-19 ENCOUNTER — Other Ambulatory Visit (INDEPENDENT_AMBULATORY_CARE_PROVIDER_SITE_OTHER): Payer: Medicaid Other

## 2020-08-19 DIAGNOSIS — B20 Human immunodeficiency virus [HIV] disease: Secondary | ICD-10-CM

## 2020-08-20 ENCOUNTER — Other Ambulatory Visit: Payer: Medicaid Other

## 2020-08-20 LAB — T-HELPER CELL (CD4) - (RCID CLINIC ONLY)
CD4 % Helper T Cell: 34 % (ref 33–65)
CD4 T Cell Abs: 649 /uL (ref 400–1790)

## 2020-08-21 LAB — COMPLETE METABOLIC PANEL WITH GFR
AG Ratio: 1.9 (calc) (ref 1.0–2.5)
ALT: 11 U/L (ref 9–46)
AST: 17 U/L (ref 10–40)
Albumin: 4.6 g/dL (ref 3.6–5.1)
Alkaline phosphatase (APISO): 73 U/L (ref 36–130)
BUN: 14 mg/dL (ref 7–25)
CO2: 30 mmol/L (ref 20–32)
Calcium: 9.7 mg/dL (ref 8.6–10.3)
Chloride: 102 mmol/L (ref 98–110)
Creat: 1.26 mg/dL (ref 0.60–1.35)
GFR, Est African American: 81 mL/min/{1.73_m2} (ref >=60)
GFR, Est Non African American: 70 mL/min/{1.73_m2} (ref >=60)
Globulin: 2.4 g/dL (calc) (ref 1.9–3.7)
Glucose, Bld: 103 mg/dL — ABNORMAL HIGH (ref 65–99)
Potassium: 4.6 mmol/L (ref 3.5–5.3)
Sodium: 140 mmol/L (ref 135–146)
Total Bilirubin: 0.5 mg/dL (ref 0.2–1.2)
Total Protein: 7 g/dL (ref 6.1–8.1)

## 2020-08-21 LAB — CBC WITH DIFFERENTIAL/PLATELET
Absolute Monocytes: 462 cells/uL (ref 200–950)
Basophils Absolute: 41 cells/uL (ref 0–200)
Basophils Relative: 0.6 %
Eosinophils Absolute: 69 cells/uL (ref 15–500)
Eosinophils Relative: 1 %
HCT: 45.1 % (ref 38.5–50.0)
Hemoglobin: 15.1 g/dL (ref 13.2–17.1)
Lymphs Abs: 1987 cells/uL (ref 850–3900)
MCH: 30.3 pg (ref 27.0–33.0)
MCHC: 33.5 g/dL (ref 32.0–36.0)
MCV: 90.6 fL (ref 80.0–100.0)
MPV: 11.2 fL (ref 7.5–12.5)
Monocytes Relative: 6.7 %
Neutro Abs: 4340 cells/uL (ref 1500–7800)
Neutrophils Relative %: 62.9 %
Platelets: 202 10*3/uL (ref 140–400)
RBC: 4.98 10*6/uL (ref 4.20–5.80)
RDW: 13.1 % (ref 11.0–15.0)
Total Lymphocyte: 28.8 %
WBC: 6.9 10*3/uL (ref 3.8–10.8)

## 2020-08-21 LAB — HIV-1 RNA QUANT-NO REFLEX-BLD
HIV 1 RNA Quant: 92 Copies/mL — ABNORMAL HIGH
HIV-1 RNA Quant, Log: 1.97 Log cps/mL — ABNORMAL HIGH

## 2020-08-21 LAB — FLUORESCENT TREPONEMAL AB(FTA)-IGG-BLD: Fluorescent Treponemal ABS: REACTIVE — AB

## 2020-08-21 LAB — RPR: RPR Ser Ql: REACTIVE — AB

## 2020-08-21 LAB — RPR TITER: RPR Titer: 1:16 {titer} — ABNORMAL HIGH

## 2020-09-02 ENCOUNTER — Telehealth: Payer: Self-pay | Admitting: Family Medicine

## 2020-09-02 ENCOUNTER — Encounter: Payer: Medicaid Other | Admitting: Family Medicine

## 2020-09-02 NOTE — Telephone Encounter (Signed)
Pt called because he missed his appt today because he was confused about an automated call he had received and he said it told him someone would call to let him know if it was going to be in office and virtual so he had been waiting for a call from [place]. He wanted to know if he can be seen to do his physical this month but the only thing available is hospital follow ups, Can I use one of those or does it need to be pushed out until next month?

## 2020-09-03 ENCOUNTER — Encounter: Payer: Self-pay | Admitting: Family Medicine

## 2020-09-03 NOTE — Telephone Encounter (Signed)
Okay to use the hospital slot.

## 2020-09-03 NOTE — Telephone Encounter (Signed)
Pt arrived 10 min late on 06/06/2020 with Dr. Barron Alvine and had to reschedule. Pt arrived 20 min late on 08/07/2020 with Dr. Veto Kemps and had to reschedule. Pt was no show 09/02/2020 with you (Dr. Doreene Burke) and has been rescheduled to 3/30 per below approval.  I have mailed letter that additional late arrivals, late cancellations, and no shows may result in dismissal. Thank you.

## 2020-09-04 ENCOUNTER — Ambulatory Visit (INDEPENDENT_AMBULATORY_CARE_PROVIDER_SITE_OTHER): Payer: Medicaid Other | Admitting: Infectious Disease

## 2020-09-04 ENCOUNTER — Other Ambulatory Visit: Payer: Self-pay

## 2020-09-04 VITALS — BP 133/85 | HR 107 | Temp 98.5°F | Wt 170.0 lb

## 2020-09-04 DIAGNOSIS — F1290 Cannabis use, unspecified, uncomplicated: Secondary | ICD-10-CM | POA: Diagnosis not present

## 2020-09-04 DIAGNOSIS — G40009 Localization-related (focal) (partial) idiopathic epilepsy and epileptic syndromes with seizures of localized onset, not intractable, without status epilepticus: Secondary | ICD-10-CM

## 2020-09-04 DIAGNOSIS — R29898 Other symptoms and signs involving the musculoskeletal system: Secondary | ICD-10-CM | POA: Diagnosis not present

## 2020-09-04 DIAGNOSIS — B20 Human immunodeficiency virus [HIV] disease: Secondary | ICD-10-CM

## 2020-09-04 DIAGNOSIS — S060X1D Concussion with loss of consciousness of 30 minutes or less, subsequent encounter: Secondary | ICD-10-CM | POA: Diagnosis not present

## 2020-09-04 NOTE — Progress Notes (Signed)
Subjective:   Chief complaint: Follow-up for HIV on medications    Patient ID: Perry Norton, male    DOB: October 02, 1975, 45 y.o.   MRN: [id number]  HPI  This 45 year-old African-American man with HIV that has been well controlled on Biktarvy.  He does have a seizure disorder and is followed by neurologist in [facility].  He does also smoke marijuana which he believes helps with his seizures.  Post to have surgery at with Dr. Shon Baton he tells me but still needs follow-up with him in April.  In reviewing his labs his viral load and come up into the 90s so we will recheck this.  He denies missing any medications.  He told me that he his granddaughter just turned to-year-old in February and showed me a picture of her.  She apparently has the same birthdate as his 2 daughters who are also born on the same day.       Past Medical History:  Diagnosis Date  . Fever 10/03/2019  . HIV (human immunodeficiency virus infection) (HCC)   . Marijuana use 07/12/2019  . Seizures (HCC)    epilepsy  . Visual loss 09/07/2018    Past Surgical History:  Procedure Laterality Date  . NO PAST SURGERIES      Family History  Problem Relation Age of Onset  . Sarcoidosis Mother       Social History   Socioeconomic History  . Marital status: Married    Spouse name: Not on file  . Number of children: Not on file  . Years of education: Not on file  . Highest education level: Not on file  Occupational History  . Not on file  Tobacco Use  . Smoking status: Current Some Day Smoker    Packs/day: 1.00    Types: Cigarettes    Start date: 06/22/1994  . Smokeless tobacco: Never Used  Vaping Use  . Vaping Use: Never used  Substance and Sexual Activity  . Alcohol use: Yes    Alcohol/week: 3.0 standard drinks    Types: 3 Cans of beer per week    Comment: 1 time a week  . Drug use: Yes    Types: Marijuana    Comment: daily  . Sexual activity: Yes  Other Topics Concern  . Not on file   Social History Narrative  . Not on file   Social Determinants of Health   Financial Resource Strain: Not on file  Food Insecurity: Not on file  Transportation Needs: Not on file  Physical Activity: Not on file  Stress: Not on file  Social Connections: Not on file    Allergies  Allergen Reactions  . Onion Rash     Current Outpatient Medications:  .  acetaminophen (TYLENOL) 500 MG tablet, Take 2 tablets (1,000 mg total) by mouth every 6 (six) hours as needed for mild pain or moderate pain., Disp: 90 tablet, Rfl: 0 .  bictegravir-emtricitabine-tenofovir AF (BIKTARVY) 50-200-25 MG TABS tablet, Take 1 tablet by mouth daily., Disp: 30 tablet, Rfl: 11 .  lacosamide (VIMPAT) 200 MG TABS tablet, Take 1 tablet (200 mg total) by mouth 2 (two) times daily., Disp: 60 tablet, Rfl: 5    Review of Systems  Constitutional: Negative for activity change, appetite change, chills, diaphoresis, fatigue, fever and unexpected weight change.  HENT: Negative for congestion, rhinorrhea, sinus pressure, sneezing, sore throat and trouble swallowing.   Eyes: Negative for photophobia.  Respiratory: Negative for cough, chest tightness, shortness of  breath, wheezing and stridor.   Cardiovascular: Negative for chest pain, palpitations and leg swelling.  Gastrointestinal: Negative for abdominal distention, abdominal pain, anal bleeding, blood in stool, constipation, diarrhea, nausea and vomiting.  Genitourinary: Negative for difficulty urinating, dysuria, flank pain and hematuria.  Musculoskeletal: Negative for arthralgias, back pain, gait problem, joint swelling and myalgias.  Skin: Negative for color change, pallor and rash.  Neurological: Negative for dizziness, tremors, weakness and light-headedness.  Hematological: Negative for adenopathy. Does not bruise/bleed easily.  Psychiatric/Behavioral: Negative for agitation, behavioral problems, confusion, decreased concentration, dysphoric mood and sleep  disturbance.       Objective:   Physical Exam Constitutional:      General: He is not in acute distress.    Appearance: Normal appearance. He is well-developed. He is not ill-appearing or diaphoretic.  HENT:     Head: Normocephalic and atraumatic.     Right Ear: Hearing and external ear normal.     Left Ear: Hearing and external ear normal.     Nose: No nasal deformity or rhinorrhea.  Eyes:     General: No scleral icterus.    Extraocular Movements: Extraocular movements intact.     Conjunctiva/sclera: Conjunctivae normal.     Right eye: Right conjunctiva is not injected.     Left eye: Left conjunctiva is not injected.  Neck:     Vascular: No JVD.  Cardiovascular:     Rate and Rhythm: Normal rate.     Heart sounds: S1 normal and S2 normal.  Pulmonary:     Effort: Pulmonary effort is normal. No respiratory distress.  Abdominal:     General: There is no distension.     Palpations: Abdomen is soft.  Musculoskeletal:        General: Normal range of motion.     Right shoulder: Normal.     Left shoulder: Normal.     Cervical back: Normal range of motion and neck supple.     Right hip: Normal.     Left hip: Normal.     Right knee: Normal.     Left knee: Normal.  Lymphadenopathy:     Head:     Right side of head: No submandibular, preauricular or posterior auricular adenopathy.     Left side of head: No submandibular, preauricular or posterior auricular adenopathy.     Cervical: No cervical adenopathy.     Right cervical: No superficial or deep cervical adenopathy.    Left cervical: No superficial or deep cervical adenopathy.  Skin:    General: Skin is warm and dry.     Coloration: Skin is not pale.     Findings: No abrasion, bruising, ecchymosis, erythema, lesion or rash.     Nails: There is no clubbing.  Neurological:     General: No focal deficit present.     Mental Status: He is alert and oriented to person, place, and time.     Sensory: No sensory deficit.      Coordination: Coordination normal.  Psychiatric:        Attention and Perception: He is attentive.        Mood and Affect: Mood normal.        Speech: Speech normal.        Behavior: Behavior normal. Behavior is cooperative.        Thought Content: Thought content normal.        Cognition and Memory: Cognition and memory normal.        Judgment: Judgment normal.  Assessment & Plan:   HIV disease: Check viral load today return to clinic in 6 months  Seizure disorder being followed by Neurology in [place] note he should not be on Trileptal due to drug drug interaction with Biktarvy  Syphilis: Titers come down to 1-16 have been stable Marijuana use: I do not really have a problem with it as he is does not get into legal problems.  Cervical spine disease spinal cord compression: Is going to see Dr. Shon Baton in April  Concussive syndrome with memory problems: Followed by neurology and on disability

## 2020-09-07 LAB — HIV-1 RNA QUANT-NO REFLEX-BLD
HIV 1 RNA Quant: 23 Copies/mL — ABNORMAL HIGH
HIV-1 RNA Quant, Log: 1.37 Log cps/mL — ABNORMAL HIGH

## 2020-09-18 ENCOUNTER — Other Ambulatory Visit: Payer: Self-pay

## 2020-09-18 ENCOUNTER — Encounter: Payer: Self-pay | Admitting: Family Medicine

## 2020-09-18 ENCOUNTER — Ambulatory Visit (INDEPENDENT_AMBULATORY_CARE_PROVIDER_SITE_OTHER): Payer: Medicaid Other

## 2020-09-18 ENCOUNTER — Ambulatory Visit (INDEPENDENT_AMBULATORY_CARE_PROVIDER_SITE_OTHER): Payer: Medicaid Other | Admitting: Family Medicine

## 2020-09-18 VITALS — BP 122/74 | HR 100 | Temp 98.3°F | Ht 72.0 in | Wt 172.4 lb

## 2020-09-18 DIAGNOSIS — L089 Local infection of the skin and subcutaneous tissue, unspecified: Secondary | ICD-10-CM

## 2020-09-18 DIAGNOSIS — R4689 Other symptoms and signs involving appearance and behavior: Secondary | ICD-10-CM | POA: Diagnosis not present

## 2020-09-18 DIAGNOSIS — M25474 Effusion, right foot: Secondary | ICD-10-CM

## 2020-09-18 DIAGNOSIS — M79674 Pain in right toe(s): Secondary | ICD-10-CM | POA: Diagnosis not present

## 2020-09-18 DIAGNOSIS — L720 Epidermal cyst: Secondary | ICD-10-CM

## 2020-09-18 HISTORY — DX: Local infection of the skin and subcutaneous tissue, unspecified: L08.9

## 2020-09-18 HISTORY — DX: Effusion, right foot: M25.474

## 2020-09-18 MED ORDER — DOXYCYCLINE HYCLATE 100 MG PO TABS: 100.0000 mg | ORAL_TABLET | Freq: Two times a day (BID) | ORAL | 0 refills | Status: DC

## 2020-09-18 MED ORDER — DICLOFENAC SODIUM 1 % EX GEL: CUTANEOUS | 1 refills | Status: DC

## 2020-09-18 NOTE — Progress Notes (Signed)
Please see message . Thank you .

## 2020-09-18 NOTE — Progress Notes (Unsigned)
Dr Doreene Burke saw this pt today. He wanted labs, according to his notes, but I do not see any ordered. I sent Dr Doreene Burke a direct message before he left, he said he saw it, and I left a note by his phone in his office. If he was referring to future orders, there are some in there from an infectious disease doctor but I cannot draw [facility] labs here We drew 2 tubes and took a urine sample. Can you look at his chart and see if I'm missing something. We definitely don't want this patient to return any time soon.  Thank you!

## 2020-09-18 NOTE — Progress Notes (Addendum)
Established Patient Office Visit  Subjective:  Patient ID: Perry Norton, male    DOB: 1975/08/21  Age: 45 y.o. MRN: [id number]  CC:  Chief Complaint  Patient presents with  . Annual Exam    CPE, no concerns.     HPI Perry Norton presents for for physical exam but is having a great deal of discomfort with his right great MTP.  There was no injury but he had been running 6 miles daily in the morning and again in the evening.  He has since stopped but this area continues to bother him.  No family history of gout.  He has also has a sore place on his right buttock.  There is been an inclusion cyst in this area that is previously draining pus.  It is not draining since.  Recent follow-up with ID doctors.  Blood work was ordered but he was nonfasting and invited back.  He said that he had come back to have the blood work drawn but that blood work has not been resulted as of yet.  He knows that I need the results of that blood work for his physical.  He claims compliance with his HIV meds.   Past Medical History:  Diagnosis Date  . Fever 10/03/2019  . HIV (human immunodeficiency virus infection) (HCC)   . Marijuana use 07/12/2019  . Seizures (HCC)    epilepsy  . Visual loss 09/07/2018    Past Surgical History:  Procedure Laterality Date  . NO PAST SURGERIES      Family History  Problem Relation Age of Onset  . Sarcoidosis Mother     Social History   Socioeconomic History  . Marital status: Married    Spouse name: Not on file  . Number of children: Not on file  . Years of education: Not on file  . Highest education level: Not on file  Occupational History  . Not on file  Tobacco Use  . Smoking status: Current Some Day Smoker    Packs/day: 1.00    Types: Cigarettes    Start date: 06/22/1994  . Smokeless tobacco: Never Used  Vaping Use  . Vaping Use: Never used  Substance and Sexual Activity  . Alcohol use: Yes    Alcohol/week: 3.0 standard drinks    Types: 3 Cans of beer  per week    Comment: 1 time a week  . Drug use: Yes    Types: Marijuana    Comment: daily  . Sexual activity: Yes  Other Topics Concern  . Not on file  Social History Narrative  . Not on file   Social Determinants of Health   Financial Resource Strain: Not on file  Food Insecurity: Not on file  Transportation Needs: Not on file  Physical Activity: Not on file  Stress: Not on file  Social Connections: Not on file  Intimate Partner Violence: Not on file    Outpatient Medications Prior to Visit  Medication Sig Dispense Refill  . acetaminophen (TYLENOL) 500 MG tablet Take 2 tablets (1,000 mg total) by mouth every 6 (six) hours as needed for mild pain or moderate pain. (Patient not taking: Reported on 09/18/2020) 90 tablet 0  . bictegravir-emtricitabine-tenofovir AF (BIKTARVY) 50-200-25 MG TABS tablet Take 1 tablet by mouth daily. (Patient not taking: Reported on 09/18/2020) 30 tablet 11  . lacosamide (VIMPAT) 200 MG TABS tablet Take 1 tablet (200 mg total) by mouth 2 (two) times daily. 60 tablet 5   No facility-administered medications prior  to visit.    Allergies  Allergen Reactions  . Onion Rash    ROS Review of Systems  Constitutional: Negative.   HENT: Negative.   Eyes: Negative for photophobia and visual disturbance.  Respiratory: Negative.   Cardiovascular: Negative.   Gastrointestinal: Negative.   Endocrine: Negative for polyphagia and polyuria.  Genitourinary: Negative.   Musculoskeletal: Positive for arthralgias.  Skin: Positive for color change. Negative for wound.  Allergic/Immunologic: Positive for immunocompromised state.  Neurological: Negative.   Psychiatric/Behavioral: Positive for agitation.      Objective:    Physical Exam Vitals and nursing note reviewed.  Constitutional:      General: He is not in acute distress.    Appearance: Normal appearance. He is not ill-appearing, toxic-appearing or diaphoretic.  HENT:     Head: Normocephalic and  atraumatic.     Salivary Glands: Right salivary gland is not diffusely enlarged. Left salivary gland is not diffusely enlarged.     Right Ear: Tympanic membrane, ear canal and external ear normal.     Left Ear: Tympanic membrane, ear canal and external ear normal.     Mouth/Throat:     Mouth: Mucous membranes are moist. No injury or oral lesions.  Eyes:     General: Lids are normal.     Extraocular Movements: Extraocular movements intact.     Conjunctiva/sclera: Conjunctivae normal.  Neck:     Thyroid: No thyroid mass.  Cardiovascular:     Rate and Rhythm: Normal rate and regular rhythm.  Pulmonary:     Effort: Pulmonary effort is normal.     Breath sounds: Normal breath sounds.  Abdominal:     General: Bowel sounds are normal.  Musculoskeletal:     Cervical back: Neck supple.       Feet:  Lymphadenopathy:     Cervical: No cervical adenopathy.  Skin:      Neurological:     Mental Status: He is alert.     BP 122/74   Pulse 100   Temp 98.3 F (36.8 C) (Temporal)   Ht 6' (1.829 m)   Wt 172 lb 6.4 oz (78.2 kg)   SpO2 100%   BMI 23.38 kg/m  Wt Readings from Last 3 Encounters:  09/18/20 172 lb 6.4 oz (78.2 kg)  09/04/20 170 lb (77.1 kg)  07/05/20 167 lb 3.2 oz (75.8 kg)     Health Maintenance Due  Topic Date Due  . COVID-19 Vaccine (3 - Pfizer risk 4-dose series) 07/18/2020    There are no preventive care reminders to display for this patient.  Lab Results  Component Value Date   TSH 0.79 02/23/2020   Lab Results  Component Value Date   WBC 6.9 08/19/2020   HGB 15.1 08/19/2020   HCT 45.1 08/19/2020   MCV 90.6 08/19/2020   PLT 202 08/19/2020   Lab Results  Component Value Date   NA 140 08/19/2020   K 4.6 08/19/2020   CO2 30 08/19/2020   GLUCOSE 103 (H) 08/19/2020   BUN 14 08/19/2020   CREATININE 1.26 08/19/2020   BILITOT 0.5 08/19/2020   ALKPHOS 63 02/23/2020   AST 17 08/19/2020   ALT 11 08/19/2020   PROT 7.0 08/19/2020   ALBUMIN 4.9  02/23/2020   CALCIUM 9.7 08/19/2020   ANIONGAP 11 02/08/2020   GFR 71.19 02/23/2020   Lab Results  Component Value Date   CHOL 180 12/26/2018   Lab Results  Component Value Date   HDL 46 12/26/2018   Lab  Results  Component Value Date   LDLCALC 100 (H) 12/26/2018   Lab Results  Component Value Date   TRIG 226 (H) 12/26/2018   Lab Results  Component Value Date   CHOLHDL 3.9 12/26/2018   No results found for: HGBA1C    Assessment & Plan:   Problem List Items Addressed This Visit      Musculoskeletal and Integument   Infected inclusion cyst   Relevant Medications   doxycycline (VIBRA-TABS) 100 MG tablet   Swelling of first metatarsophalangeal (MTP) joint of right foot - Primary   Relevant Medications   diclofenac Sodium (VOLTAREN) 1 % GEL   Other Relevant Orders   DG Foot Complete Right (Completed)   Uric acid     Other   Behavior concern   Relevant Orders   Urine drugs of abuse scrn w alc, routine ([facility])      Meds ordered this encounter  Medications  . doxycycline (VIBRA-TABS) 100 MG tablet    Sig: Take 1 tablet (100 mg total) by mouth 2 (two) times daily.    Dispense:  20 tablet    Refill:  0  . diclofenac Sodium (VOLTAREN) 1 % GEL    Sig: Apply a small grape sized amount to sore place around right great toe 4 times daily.    Dispense:  150 g    Refill:  1    Follow-up: Return Return for physical exam..  Asked him to be sure to go back for the blood work that has been ordered because it is also what I need for his physical.  Advised him to take doxycycline twice daily for his cyst and use Voltaren gel up to 4 times daily for his sore foot.  May need sports med follow-up as well.   Patient was reasonably cooperative with me.  He would not or could not sit squarely on the exam table and could not lie back for an abdominal exam today because of tenderness of inflamed cyst..  My CMA noted that he refused to answer basic proving  questions for her.  Mliss Sax, MD   09/18/20 addendum: Patient would or could not cooperate well for the x-ray of his foot.  He would not sit for his blood draw.

## 2020-09-18 NOTE — Progress Notes (Unsigned)
It looks from Dr. Evangeline Gula note that he instructed the patient to return to ID to get the labs that were ordered done because many of those labs overlap with CPE labs. I don't think Dr. Doreene Burke was ordering additional labs.

## 2020-09-18 NOTE — Progress Notes (Unsigned)
Pt was seen on 09/18/20. Foot x ray was ordered. Pt refused to sit like I needed him to. Images were not the best. Labwork was ordered. Pt refused to sit. Stood for the bloodwork.  Told me, " I'm a borned asshole and I'll be the first to admit it."  He wanted to use the bathroom. I asked him to take a cup and leave a sample for a urinalysis. He turned around and said, " I need to go smoke a cigarette for my temperament first"   Went to his car, came back shortly. Was in the restroom for at least 10 minutes, playing music loud. Came out said he felt like he was going to pass out. Asia tried to get him to sit down. I got Dr Doreene Burke from a meeting. Pt was stumbling out of the parking lot and going toward [facility].  Dr. Doreene Burke said we had done all we could at this point.

## 2020-09-18 NOTE — Progress Notes (Unsigned)
Pt was asked ot leave urine sample for UA pt stated he needed to go out and smoke a cigarette before he lost his temper pt stated, pt refused to have a seat after stating he was feeling a little lightheaded after having a labs drawn and giving urine sample ( he was in restroom for at least 10 mins). He began to slightly lean as if he were going to fall. I proceeded to ask him if he were okay Pt stated he did not want help, and continued to stumble out of the front office doors. I followed him to make sure he was alright he continued to stumbled I tried to convince him to have a seat since he was lightheaded , I instructed Marylene Land to get Dr. Doreene Burke, asking pt would he like to be assessed, Pt then stumbled off the side walk I again asked if he was okay he stated he would be fine he just needed to gather himself he proceeded to walk up thrwalk towards sheets cutting across at [employer]

## 2020-09-19 DIAGNOSIS — R4689 Other symptoms and signs involving appearance and behavior: Secondary | ICD-10-CM | POA: Diagnosis not present

## 2020-09-19 HISTORY — DX: Other symptoms and signs involving appearance and behavior: R46.89

## 2020-09-19 LAB — URIC ACID: Uric Acid, Serum: 5.1 mg/dL (ref 4.0–7.8)

## 2020-09-19 NOTE — Addendum Note (Signed)
Addended by: Andrez Grime on: 09/19/2020 07:40 AM   Modules accepted: Orders

## 2020-09-26 LAB — URINE DRUGS OF ABUSE SCREEN W ALC, ROUTINE (REF LAB)
Amphetamines, Urine: NEGATIVE ng/mL
Barbiturate Quant, Ur: NEGATIVE ng/mL
Benzodiazepine Quant, Ur: NEGATIVE ng/mL
Cocaine (Metab.): NEGATIVE ng/mL
Ethanol, Urine: NEGATIVE %
Methadone Screen, Urine: NEGATIVE ng/mL
Opiate Quant, Ur: NEGATIVE ng/mL
PCP Quant, Ur: NEGATIVE ng/mL
Propoxyphene: NEGATIVE ng/mL

## 2020-09-26 LAB — PANEL 799049
CARBOXY THC GC/MS CONF: >750 ng/mL
Cannabinoid GC/MS, Ur: POSITIVE — AB

## 2020-10-23 ENCOUNTER — Telehealth: Payer: Self-pay | Admitting: Family Medicine

## 2020-10-23 NOTE — Telephone Encounter (Signed)
Please advise message below. Okay for referral to PT for neck pain?

## 2020-10-23 NOTE — Telephone Encounter (Signed)
Patient is requesting a referral to Physical Therapy for neck pain. He states that he discussed his neck pain at his last visit. He is scheduled on 05/12 for an appointment, but it can be canceled if Dr. Doreene Burke can do a referral without a visit being required. Please advise.

## 2020-10-24 NOTE — Telephone Encounter (Signed)
Will see him on the 12th.

## 2020-10-25 NOTE — Telephone Encounter (Signed)
Patient aware of of message below and will come to appointment.

## 2020-10-29 ENCOUNTER — Other Ambulatory Visit: Payer: Self-pay

## 2020-10-31 ENCOUNTER — Other Ambulatory Visit: Payer: Self-pay

## 2020-10-31 ENCOUNTER — Encounter: Payer: Self-pay | Admitting: Family Medicine

## 2020-10-31 ENCOUNTER — Ambulatory Visit: Payer: Medicaid Other | Admitting: Family Medicine

## 2020-10-31 VITALS — BP 120/82 | HR 111 | Temp 97.0°F | Ht 72.0 in | Wt 174.6 lb

## 2020-10-31 DIAGNOSIS — S161XXA Strain of muscle, fascia and tendon at neck level, initial encounter: Secondary | ICD-10-CM

## 2020-10-31 DIAGNOSIS — M4802 Spinal stenosis, cervical region: Secondary | ICD-10-CM

## 2020-10-31 HISTORY — DX: Spinal stenosis, cervical region: M48.02

## 2020-10-31 NOTE — Progress Notes (Signed)
Established Patient Office Visit  Subjective:  Patient ID: Perry Norton, male    DOB: 09/09/1975  Age: 45 y.o. MRN: [id number]  CC:  Chief Complaint  Patient presents with  . Pain    C/O neck pains would like referral for PT for this     HPI Perry Norton presents for standing history of cervical pain and stiffness.  He has been in physical therapy and this is been helpful for him.  He says that this needs to be reordered for him to continue.  He would like to continue physical therapy.  He has a patient of Dr. Horald Chestnut.  Surgery has been suggested.  He was told to continue working with the physical therapy and follow back up with Dr. Ethelene Hal.   Past Medical History:  Diagnosis Date  . Fever 10/03/2019  . HIV (human immunodeficiency virus infection) (HCC)   . Marijuana use 07/12/2019  . Seizures (HCC)    epilepsy  . Visual loss 09/07/2018    Past Surgical History:  Procedure Laterality Date  . NO PAST SURGERIES      Family History  Problem Relation Age of Onset  . Sarcoidosis Mother     Social History   Socioeconomic History  . Marital status: Married    Spouse name: Not on file  . Number of children: Not on file  . Years of education: Not on file  . Highest education level: Not on file  Occupational History  . Not on file  Tobacco Use  . Smoking status: Current Some Day Smoker    Packs/day: 1.00    Types: Cigarettes    Start date: 06/22/1994  . Smokeless tobacco: Never Used  Vaping Use  . Vaping Use: Never used  Substance and Sexual Activity  . Alcohol use: Yes    Alcohol/week: 3.0 standard drinks    Types: 3 Cans of beer per week    Comment: 1 time a week  . Drug use: Yes    Types: Marijuana    Comment: daily  . Sexual activity: Yes  Other Topics Concern  . Not on file  Social History Narrative  . Not on file   Social Determinants of Health   Financial Resource Strain: Not on file  Food Insecurity: Not on file  Transportation Needs: Not on file   Physical Activity: Not on file  Stress: Not on file  Social Connections: Not on file  Intimate Partner Violence: Not on file    Outpatient Medications Prior to Visit  Medication Sig Dispense Refill  . bictegravir-emtricitabine-tenofovir AF (BIKTARVY) 50-200-25 MG TABS tablet Take 1 tablet by mouth daily. 30 tablet 11  . acetaminophen (TYLENOL) 500 MG tablet Take 2 tablets (1,000 mg total) by mouth every 6 (six) hours as needed for mild pain or moderate pain. 90 tablet 0  . lacosamide (VIMPAT) 200 MG TABS tablet Take 1 tablet (200 mg total) by mouth 2 (two) times daily. (Patient not taking: Reported on 10/31/2020) 60 tablet 5  . diclofenac Sodium (VOLTAREN) 1 % GEL Apply a small grape sized amount to sore place around right great toe 4 times daily. (Patient not taking: Reported on 10/31/2020) 150 g 1  . doxycycline (VIBRA-TABS) 100 MG tablet Take 1 tablet (100 mg total) by mouth 2 (two) times daily. (Patient not taking: Reported on 10/31/2020) 20 tablet 0   No facility-administered medications prior to visit.    Allergies  Allergen Reactions  . Onion Rash    ROS Review of Systems  Constitutional: Negative.   Musculoskeletal: Positive for neck pain and neck stiffness.  Neurological: Negative for weakness and numbness.      Objective:    Physical Exam Vitals and nursing note reviewed.  Constitutional:      General: He is not in acute distress.    Appearance: Normal appearance. He is not ill-appearing, toxic-appearing or diaphoretic.  HENT:     Head: Normocephalic and atraumatic.     Right Ear: External ear normal.     Left Ear: External ear normal.  Eyes:     General: No scleral icterus.       Right eye: No discharge.        Left eye: No discharge.     Conjunctiva/sclera: Conjunctivae normal.  Pulmonary:     Effort: Pulmonary effort is normal.  Musculoskeletal:     Cervical back: Tenderness present. Pain with movement present. Decreased range of motion.  Neurological:      Mental Status: He is alert and oriented to person, place, and time.  Psychiatric:        Mood and Affect: Mood normal.        Behavior: Behavior normal.     BP 120/82   Pulse (!) 111   Temp (!) 97 F (36.1 C) (Temporal)   Ht 6' (1.829 m)   Wt 174 lb 9.6 oz (79.2 kg)   SpO2 97%   BMI 23.68 kg/m  Wt Readings from Last 3 Encounters:  10/31/20 174 lb 9.6 oz (79.2 kg)  09/18/20 172 lb 6.4 oz (78.2 kg)  09/04/20 170 lb (77.1 kg)     There are no preventive care reminders to display for this patient.  There are no preventive care reminders to display for this patient.  Lab Results  Component Value Date   TSH 0.79 02/23/2020   Lab Results  Component Value Date   WBC 6.9 08/19/2020   HGB 15.1 08/19/2020   HCT 45.1 08/19/2020   MCV 90.6 08/19/2020   PLT 202 08/19/2020   Lab Results  Component Value Date   NA 140 08/19/2020   K 4.6 08/19/2020   CO2 30 08/19/2020   GLUCOSE 103 (H) 08/19/2020   BUN 14 08/19/2020   CREATININE 1.26 08/19/2020   BILITOT 0.5 08/19/2020   ALKPHOS 63 02/23/2020   AST 17 08/19/2020   ALT 11 08/19/2020   PROT 7.0 08/19/2020   ALBUMIN 4.9 02/23/2020   CALCIUM 9.7 08/19/2020   ANIONGAP 11 02/08/2020   GFR 71.19 02/23/2020   Lab Results  Component Value Date   CHOL 180 12/26/2018   Lab Results  Component Value Date   HDL 46 12/26/2018   Lab Results  Component Value Date   LDLCALC 100 (H) 12/26/2018   Lab Results  Component Value Date   TRIG 226 (H) 12/26/2018   Lab Results  Component Value Date   CHOLHDL 3.9 12/26/2018   No results found for: HGBA1C    Assessment & Plan:   Problem List Items Addressed This Visit      Musculoskeletal and Integument   Cervical strain - Primary   Relevant Orders   Ambulatory referral to Physical Therapy      No orders of the defined types were placed in this encounter.   Follow-up: Return in about 3 months (around 01/31/2021).  Suggested that he follow-up with Dr. Ethelene Hal at some  point.  Mliss Sax, MD

## 2020-11-01 ENCOUNTER — Encounter: Payer: Self-pay | Admitting: Family Medicine

## 2020-12-09 ENCOUNTER — Other Ambulatory Visit: Payer: Self-pay | Admitting: Infectious Disease

## 2020-12-09 DIAGNOSIS — B20 Human immunodeficiency virus [HIV] disease: Secondary | ICD-10-CM

## 2021-01-31 ENCOUNTER — Ambulatory Visit: Payer: Medicaid Other | Admitting: Family Medicine

## 2021-02-13 ENCOUNTER — Other Ambulatory Visit: Payer: Self-pay

## 2021-02-13 DIAGNOSIS — B20 Human immunodeficiency virus [HIV] disease: Secondary | ICD-10-CM

## 2021-02-13 MED ORDER — BIKTARVY 50-200-25 MG PO TABS: 1.0000 | ORAL_TABLET | Freq: Every day | ORAL | 2 refills | Status: DC

## 2021-03-07 ENCOUNTER — Other Ambulatory Visit (HOSPITAL_COMMUNITY)
Admission: RE | Admit: 2021-03-07 | Discharge: 2021-03-07 | Disposition: A | Discharge status: Home / Self Care | Payer: Medicaid Other | Source: Ambulatory Visit | Attending: Infectious Disease | Admitting: Infectious Disease

## 2021-03-07 ENCOUNTER — Encounter: Payer: Self-pay | Admitting: Infectious Disease

## 2021-03-07 ENCOUNTER — Encounter (HOSPITAL_BASED_OUTPATIENT_CLINIC_OR_DEPARTMENT_OTHER): Payer: Self-pay | Admitting: Surgery

## 2021-03-07 ENCOUNTER — Other Ambulatory Visit: Payer: Self-pay

## 2021-03-07 ENCOUNTER — Ambulatory Visit: Payer: Self-pay | Admitting: Surgery

## 2021-03-07 ENCOUNTER — Other Ambulatory Visit: Payer: Medicaid Other

## 2021-03-07 ENCOUNTER — Ambulatory Visit (INDEPENDENT_AMBULATORY_CARE_PROVIDER_SITE_OTHER): Payer: Medicaid Other | Admitting: Infectious Disease

## 2021-03-07 ENCOUNTER — Telehealth: Payer: Self-pay

## 2021-03-07 DIAGNOSIS — A539 Syphilis, unspecified: Secondary | ICD-10-CM

## 2021-03-07 DIAGNOSIS — Z23 Encounter for immunization: Secondary | ICD-10-CM

## 2021-03-07 DIAGNOSIS — G40309 Generalized idiopathic epilepsy and epileptic syndromes, not intractable, without status epilepticus: Secondary | ICD-10-CM | POA: Diagnosis not present

## 2021-03-07 DIAGNOSIS — L723 Sebaceous cyst: Secondary | ICD-10-CM | POA: Diagnosis not present

## 2021-03-07 DIAGNOSIS — F1290 Cannabis use, unspecified, uncomplicated: Secondary | ICD-10-CM | POA: Diagnosis not present

## 2021-03-07 DIAGNOSIS — B20 Human immunodeficiency virus [HIV] disease: Secondary | ICD-10-CM | POA: Diagnosis not present

## 2021-03-07 DIAGNOSIS — R42 Dizziness and giddiness: Secondary | ICD-10-CM | POA: Diagnosis not present

## 2021-03-07 DIAGNOSIS — G540 Brachial plexus disorders: Secondary | ICD-10-CM

## 2021-03-07 HISTORY — DX: Dizziness and giddiness: R42

## 2021-03-07 NOTE — H&P (Signed)
  [name] [id number]   Referring Provider:  Unknown   Subjective   Chief Complaint: Subcutaneous cyst   History of Present Illness:     45 year old man with history of seizures, HIV who presents for evaluation of a sebaceous cyst on the buttocks.  He reports that this was lanced here in this office a few months ago but subsequently it has recurred.  It is uncomfortable when he sits on the area, but he has not had any further infections or drainage from the area.  He would like to have this excised.   Review of Systems: A complete review of systems was obtained from the patient.  I have reviewed this information and discussed as appropriate with the patient.  See HPI as well for other ROS.   Medical History: Past Medical History:  Diagnosis Date   Seizures (CMS-HCC)     There is no problem list on file for this patient.   History reviewed. No pertinent surgical history.   No Known Allergies  Current Outpatient Medications on File Prior to Visit  Medication Sig Dispense Refill   BIKTARVY 50-200-25 mg tablet Take 1 tablet by mouth once daily     No current facility-administered medications on file prior to visit.    History reviewed. No pertinent family history.   Social History   Tobacco Use  Smoking Status Smoker, Current Status Unknown  Smokeless Tobacco Never Used     Social History   Socioeconomic History   Marital status: Married  Tobacco Use   Smoking status: Smoker, Current Status Unknown   Smokeless tobacco: Never Used  Building services engineer Use: Never used  Substance and Sexual Activity   Alcohol use: Yes   Drug use: Defer   Sexual activity: Defer    Objective:    Vitals:   03/07/21 1434  BP: 110/76  Pulse: 85  Temp: 36.7 C (98.1 F)  SpO2: 96%  Weight: 72.8 kg (160 lb 9.6 oz)  Height: 182.9 cm (6')    Body mass index is 21.78 kg/m.  Alert, no distress On the left buttock there is an approximately 3 x 2 cm subcutaneous mass with  overlying scar, no induration, fluctuance or warmth  Assessment and Plan:  Diagnoses and all orders for this visit:  Subcutaneous mass    Likely sebaceous cyst.  Desires excision.  Discussed the procedure and risks of bleeding, infection, pain, scarring, wound healing problems, recurrence of the lesion.  Questions welcomed and answered.  Proceed as soon as possible  Theo Krumholz Carlye Grippe MD FACS

## 2021-03-07 NOTE — H&P (View-Only) (Signed)
  Perry Norton [id number]   Referring Provider:  Unknown   Subjective   Chief Complaint: Subcutaneous cyst   History of Present Illness:     45-year-old man with history of seizures, HIV who presents for evaluation of a sebaceous cyst on the buttocks.  He reports that this was lanced here in this office a few months ago but subsequently it has recurred.  It is uncomfortable when he sits on the area, but he has not had any further infections or drainage from the area.  He would like to have this excised.   Review of Systems: A complete review of systems was obtained from the patient.  I have reviewed this information and discussed as appropriate with the patient.  See HPI as well for other ROS.   Medical History: Past Medical History:  Diagnosis Date   Seizures (CMS-HCC)     There is no problem list on file for this patient.   History reviewed. No pertinent surgical history.   No Known Allergies  Current Outpatient Medications on File Prior to Visit  Medication Sig Dispense Refill   BIKTARVY 50-200-25 mg tablet Take 1 tablet by mouth once daily     No current facility-administered medications on file prior to visit.    History reviewed. No pertinent family history.   Social History   Tobacco Use  Smoking Status Smoker, Current Status Unknown  Smokeless Tobacco Never Used     Social History   Socioeconomic History   Marital status: Married  Tobacco Use   Smoking status: Smoker, Current Status Unknown   Smokeless tobacco: Never Used  Vaping Use   Vaping Use: Never used  Substance and Sexual Activity   Alcohol use: Yes   Drug use: Defer   Sexual activity: Defer    Objective:    Vitals:   03/07/21 1434  BP: 110/76  Pulse: 85  Temp: 36.7 C (98.1 F)  SpO2: 96%  Weight: 72.8 kg (160 lb 9.6 oz)  Height: 182.9 cm (6')    Body mass index is 21.78 kg/m.  Alert, no distress On the left buttock there is an approximately 3 x 2 cm subcutaneous mass with  overlying scar, no induration, fluctuance or warmth  Assessment and Plan:  Diagnoses and all orders for this visit:  Subcutaneous mass    Likely sebaceous cyst.  Desires excision.  Discussed the procedure and risks of bleeding, infection, pain, scarring, wound healing problems, recurrence of the lesion.  Questions welcomed and answered.  Proceed as soon as possible  Corley Kohls AMANDA Nakea Gouger MD FACS 

## 2021-03-07 NOTE — Telephone Encounter (Signed)
Patient called and wanted to reschedule appointment today to a phone visit due to being out of town and will come in this afternoon to get blood work. I called phone number listed in chart and visit info - no answer. Per Dr.Van Dam patient can get lab work done today and schedule follow up appointment with Dr.Van Dam while in the office today.   Peytin Dechert Lesli Albee, CMA

## 2021-03-07 NOTE — Progress Notes (Signed)
Subjective:   Chief complaint dizziness with prolonged sitting or standing with some vertiginous symptoms    Patient ID: Perry Norton, male    DOB: 14-Nov-1975, 45 y.o.   MRN: [id number]  HPI  This 45 year-old African-American man with HIV that has been well controlled on Biktarvy.  He does have a seizure disorder and is followed by neurologist in [facility].  He does also smoke marijuana which he believes helps with his seizures.  Hopping initially was going to this visit in person then was going to do it virtually but then showed up in person.  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         Past Medical History:  Diagnosis Date   Fever 10/03/2019   HIV (human immunodeficiency virus infection) (HCC)    Marijuana use 07/12/2019   Seizures (HCC)    epilepsy   Visual loss 09/07/2018    Past Surgical History:  Procedure Laterality Date   NO PAST SURGERIES      Family History  Problem Relation Age of Onset   Sarcoidosis Mother       Social History   Socioeconomic History   Marital status: Married    Spouse name: Not on file   Number of children: Not on file   Years of education: Not on file   Highest education level: Not on file  Occupational History   Not on file  Tobacco Use   Smoking status: Some Days    Packs/day: 1.00    Types: Cigarettes    Start date: 06/22/1994   Smokeless tobacco: Never  Vaping Use   Vaping Use: Never used  Substance and Sexual Activity   Alcohol use: Yes    Alcohol/week: 3.0 standard  drinks    Types: 3 Cans of beer per week    Comment: 1 time a week   Drug use: Yes    Types: Marijuana    Comment: daily   Sexual activity: Yes  Other Topics Concern   Not on file  Social History Narrative   Not on file   Social Determinants of Health   Financial Resource Strain: Not on file  Food Insecurity: Not on file  Transportation Needs: Not on file  Physical Activity: Not on file  Stress: Not on file  Social Connections: Not on file    Allergies  Allergen Reactions   Onion Rash     Current Outpatient Medications:    bictegravir-emtricitabine-tenofovir AF (BIKTARVY) 50-200-25 MG TABS tablet, Take 1 tablet by mouth daily., Disp: 30 tablet, Rfl: 2   lacosamide (VIMPAT) 200 MG TABS tablet, Take 1 tablet (200 mg total) by mouth 2 (two) times daily. (Patient not taking: No sig reported), Disp: 60 tablet, Rfl: 5    Review of Systems  Constitutional:  Negative for activity change, appetite change, chills, diaphoresis, fatigue, fever and unexpected weight change.  HENT:  Negative for congestion, rhinorrhea, sinus pressure, sneezing, sore throat and trouble swallowing.   Eyes:  Negative for photophobia  and visual disturbance.  Respiratory:  Negative for cough, chest tightness, shortness of breath, wheezing and stridor.   Cardiovascular:  Negative for chest pain, palpitations and leg swelling.  Gastrointestinal:  Negative for abdominal distention, abdominal pain, anal bleeding, blood in stool, constipation, diarrhea, nausea and vomiting.  Genitourinary:  Negative for difficulty urinating, dysuria, flank pain and hematuria.  Musculoskeletal:  Negative for arthralgias, back pain, gait problem, joint swelling and myalgias.  Skin:  Negative for color change, pallor, rash and wound.  Neurological:  Positive for dizziness. Negative for tremors, weakness and light-headedness.  Hematological:  Negative for adenopathy. Does not bruise/bleed easily.  Psychiatric/Behavioral:  Negative  for agitation, behavioral problems, confusion, decreased concentration, dysphoric mood and sleep disturbance.       Objective:   Physical Exam Constitutional:      Appearance: He is well-developed.  HENT:     Head: Normocephalic and atraumatic.  Eyes:     Conjunctiva/sclera: Conjunctivae normal.  Cardiovascular:     Rate and Rhythm: Normal rate and regular rhythm.  Pulmonary:     Effort: Pulmonary effort is normal. No respiratory distress.     Breath sounds: No wheezing.  Abdominal:     General: There is no distension.     Palpations: Abdomen is soft.  Musculoskeletal:        General: No tenderness. Normal range of motion.     Cervical back: Normal range of motion and neck supple.  Skin:    General: Skin is warm and dry.     Coloration: Skin is not pale.     Findings: No erythema or rash.  Neurological:     General: No focal deficit present.     Mental Status: He is alert and oriented to person, place, and time.  Psychiatric:        Mood and Affect: Mood normal.        Behavior: Behavior normal.        Thought Content: Thought content normal.        Judgment: Judgment normal.    He may have had some onset of dizziness with Dix-Hallpike maneuver but I did not witness nystagmus  Orthostatic vital sign check showed blood pressure to drop by 18 points but pulse did not budge he did have dizziness with standing      Assessment & Plan:   Dizziness: I suspect this might be due to an inner ear problem and I referred him to physical therapy  I do not think this is due to orthostatic changes   HIV disease: Check viral load CD4 count CBC CMP syphilis and chlamydia and gonorrhea testing to make today.  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

## 2021-03-10 ENCOUNTER — Encounter (HOSPITAL_BASED_OUTPATIENT_CLINIC_OR_DEPARTMENT_OTHER): Payer: Self-pay | Admitting: Surgery

## 2021-03-10 ENCOUNTER — Other Ambulatory Visit: Payer: Self-pay

## 2021-03-10 DIAGNOSIS — R229 Localized swelling, mass and lump, unspecified: Secondary | ICD-10-CM

## 2021-03-10 HISTORY — DX: Localized swelling, mass and lump, unspecified: R22.9

## 2021-03-10 LAB — URINE CYTOLOGY ANCILLARY ONLY
Chlamydia: NEGATIVE
Comment: NEGATIVE
Comment: NORMAL
Neisseria Gonorrhea: NEGATIVE

## 2021-03-10 NOTE — Progress Notes (Addendum)
Spoke w/ via phone for pre-op interview---pt Lab needs dos----    ekg I stat           Lab results------none COVID test -----patient states asymptomatic no test needed Arrive at -------1230 pm [phone] NPO after MN NO Solid Food.  Clear liquids from MN until---1130 am Med rec completed Medications to take morning of surgery -----biktarvy Diabetic medication -----n/a Patient instructed to bring photo id and insurance card day of surgery Patient aware to have Driver (ride ) / caregiver   wife Clydie Braun will pick pt up cell [phone]  for 24 hours after surgery  Patient Special Instructions -----pt uinable to pick up hibiclens soap due to transportation issues, will shower with dial soap tonight and in am of surgery Pre-Op special Istructions -----no smoking for 24 hours before surgery Patient verbalized understanding of instructions that were given at this phone interview. Patient denies shortness of breath, chest pain, fever, cough at this phone interview.   Reviewed patient history with dr Viviann Spare turk mda and pt last seizure was 03-10-2021  (body jerks in sleep per patient)and pt currently off seizure medication due to sees neurology later this week to get seizure meds refilled and pt not currently taking hypertension medication due to "does not like taking pills, but blood  pressure runs ok " per pt. Pt ok for surgery 03-11-2021 at [facility] tomorrow per dr Viviann Spare turk mda.  Pt states he gets angry @times  but no diagnosed mental health issues.  Lov infectious diease dr dam 03-07-2021 epic [facility] neurology dr [facility] 02-19-2020 care everywhere

## 2021-03-11 ENCOUNTER — Ambulatory Visit (HOSPITAL_BASED_OUTPATIENT_CLINIC_OR_DEPARTMENT_OTHER)
Admission: RE | Admit: 2021-03-11 | Discharge: 2021-03-11 | Disposition: A | Discharge status: Home / Self Care | Payer: Medicaid Other | Attending: Surgery | Admitting: Surgery

## 2021-03-11 ENCOUNTER — Ambulatory Visit (HOSPITAL_BASED_OUTPATIENT_CLINIC_OR_DEPARTMENT_OTHER): Payer: Medicaid Other | Admitting: Anesthesiology

## 2021-03-11 ENCOUNTER — Encounter (HOSPITAL_BASED_OUTPATIENT_CLINIC_OR_DEPARTMENT_OTHER)
Admission: RE | Disposition: A | Discharge status: Home / Self Care | Payer: Self-pay | Source: Home / Self Care | Attending: Surgery

## 2021-03-11 ENCOUNTER — Other Ambulatory Visit: Payer: Self-pay

## 2021-03-11 ENCOUNTER — Encounter (HOSPITAL_BASED_OUTPATIENT_CLINIC_OR_DEPARTMENT_OTHER): Payer: Self-pay | Admitting: Surgery

## 2021-03-11 DIAGNOSIS — Z79899 Other long term (current) drug therapy: Secondary | ICD-10-CM | POA: Insufficient documentation

## 2021-03-11 DIAGNOSIS — R569 Unspecified convulsions: Secondary | ICD-10-CM | POA: Diagnosis not present

## 2021-03-11 DIAGNOSIS — G43019 Migraine without aura, intractable, without status migrainosus: Secondary | ICD-10-CM | POA: Diagnosis not present

## 2021-03-11 DIAGNOSIS — N529 Male erectile dysfunction, unspecified: Secondary | ICD-10-CM | POA: Diagnosis not present

## 2021-03-11 DIAGNOSIS — R222 Localized swelling, mass and lump, trunk: Secondary | ICD-10-CM | POA: Diagnosis not present

## 2021-03-11 DIAGNOSIS — L723 Sebaceous cyst: Secondary | ICD-10-CM | POA: Diagnosis present

## 2021-03-11 DIAGNOSIS — M722 Plantar fascial fibromatosis: Secondary | ICD-10-CM | POA: Diagnosis not present

## 2021-03-11 DIAGNOSIS — L720 Epidermal cyst: Secondary | ICD-10-CM | POA: Diagnosis not present

## 2021-03-11 HISTORY — DX: Presence of dental prosthetic device (complete) (partial): Z97.2

## 2021-03-11 HISTORY — DX: Headache, unspecified: R51.9

## 2021-03-11 HISTORY — DX: Essential (primary) hypertension: I10

## 2021-03-11 HISTORY — DX: Chronic kidney disease, unspecified: N18.9

## 2021-03-11 HISTORY — PX: MASS EXCISION: SHX2000

## 2021-03-11 HISTORY — DX: Syphilis, unspecified: A53.9

## 2021-03-11 LAB — CBC WITH DIFFERENTIAL/PLATELET
Absolute Monocytes: 428 cells/uL (ref 200–950)
Basophils Absolute: 40 cells/uL (ref 0–200)
Basophils Relative: 1 %
Eosinophils Absolute: 60 cells/uL (ref 15–500)
Eosinophils Relative: 1.5 %
HCT: 43.3 % (ref 38.5–50.0)
Hemoglobin: 14.1 g/dL (ref 13.2–17.1)
Lymphs Abs: 1472 cells/uL (ref 850–3900)
MCH: 29.7 pg (ref 27.0–33.0)
MCHC: 32.6 g/dL (ref 32.0–36.0)
MCV: 91.2 fL (ref 80.0–100.0)
MPV: 10.8 fL (ref 7.5–12.5)
Monocytes Relative: 10.7 %
Neutro Abs: 2000 cells/uL (ref 1500–7800)
Neutrophils Relative %: 50 %
Platelets: 204 10*3/uL (ref 140–400)
RBC: 4.75 10*6/uL (ref 4.20–5.80)
RDW: 13.1 % (ref 11.0–15.0)
Total Lymphocyte: 36.8 %
WBC: 4 10*3/uL (ref 3.8–10.8)

## 2021-03-11 LAB — COMPLETE METABOLIC PANEL WITH GFR
AG Ratio: 2 (calc) (ref 1.0–2.5)
ALT: 16 U/L (ref 9–46)
AST: 19 U/L (ref 10–40)
Albumin: 4.9 g/dL (ref 3.6–5.1)
Alkaline phosphatase (APISO): 73 U/L (ref 36–130)
BUN/Creatinine Ratio: 8 (calc) (ref 6–22)
BUN: 11 mg/dL (ref 7–25)
CO2: 31 mmol/L (ref 20–32)
Calcium: 10 mg/dL (ref 8.6–10.3)
Chloride: 103 mmol/L (ref 98–110)
Creat: 1.37 mg/dL — ABNORMAL HIGH (ref 0.60–1.29)
Globulin: 2.5 g/dL (calc) (ref 1.9–3.7)
Glucose, Bld: 96 mg/dL (ref 65–99)
Potassium: 4.8 mmol/L (ref 3.5–5.3)
Sodium: 139 mmol/L (ref 135–146)
Total Bilirubin: 0.5 mg/dL (ref 0.2–1.2)
Total Protein: 7.4 g/dL (ref 6.1–8.1)
eGFR: 66 mL/min/{1.73_m2} (ref >=60)

## 2021-03-11 LAB — T-HELPER CELLS (CD4) COUNT (NOT AT ARMC)
Absolute CD4: 519 cells/uL (ref 490–1740)
CD4 T Helper %: 39 % (ref 30–61)
Total lymphocyte count: 1342 cells/uL (ref 850–3900)

## 2021-03-11 LAB — RPR TITER: RPR Titer: 1:16 {titer} — ABNORMAL HIGH

## 2021-03-11 LAB — POCT I-STAT, CHEM 8
BUN: 11 mg/dL (ref 6–20)
Calcium, Ion: 1.31 mmol/L (ref 1.15–1.40)
Chloride: 100 mmol/L (ref 98–111)
Creatinine, Ser: 1.3 mg/dL — ABNORMAL HIGH (ref 0.61–1.24)
Glucose, Bld: 92 mg/dL (ref 70–99)
HCT: 46 % (ref 39.0–52.0)
Hemoglobin: 15.6 g/dL (ref 13.0–17.0)
Potassium: 3.3 mmol/L — ABNORMAL LOW (ref 3.5–5.1)
Sodium: 144 mmol/L (ref 135–145)
TCO2: 29 mmol/L (ref 22–32)

## 2021-03-11 LAB — RPR: RPR Ser Ql: REACTIVE — AB

## 2021-03-11 LAB — FLUORESCENT TREPONEMAL AB(FTA)-IGG-BLD: Fluorescent Treponemal ABS: REACTIVE — AB

## 2021-03-11 LAB — HIV-1 RNA QUANT-NO REFLEX-BLD
HIV 1 RNA Quant: <20 Copies/mL — ABNORMAL HIGH
HIV-1 RNA Quant, Log: <1.3 Log cps/mL — ABNORMAL HIGH

## 2021-03-11 SURGERY — EXCISION MASS
Anesthesia: Monitor Anesthesia Care | Site: Buttocks | Laterality: Left

## 2021-03-11 MED ORDER — CEFAZOLIN SODIUM-DEXTROSE 2-4 GM/100ML-% IV SOLN
2.0000 g | INTRAVENOUS | Status: AC
  Administered 2021-03-11: 2 g via INTRAVENOUS

## 2021-03-11 MED ORDER — SODIUM CHLORIDE 0.9 % IV SOLN: 250.0000 mL | INTRAVENOUS | Status: DC | PRN

## 2021-03-11 MED ORDER — PROMETHAZINE HCL 25 MG/ML IJ SOLN: 6.2500 mg | INTRAMUSCULAR | Status: DC | PRN

## 2021-03-11 MED ORDER — GABAPENTIN 300 MG PO CAPS
ORAL_CAPSULE | ORAL | Status: AC
  Filled 2021-03-11: qty 1

## 2021-03-11 MED ORDER — OXYCODONE HCL 5 MG PO TABS: 5.0000 mg | ORAL_TABLET | Freq: Once | ORAL | Status: DC | PRN

## 2021-03-11 MED ORDER — LIDOCAINE HCL (CARDIAC) PF 100 MG/5ML IV SOSY
PREFILLED_SYRINGE | INTRAVENOUS | Status: DC | PRN
  Administered 2021-03-11: 40 mg via INTRAVENOUS

## 2021-03-11 MED ORDER — FENTANYL CITRATE (PF) 100 MCG/2ML IJ SOLN: 25.0000 ug | INTRAMUSCULAR | Status: DC | PRN

## 2021-03-11 MED ORDER — CHLORHEXIDINE GLUCONATE 4 % EX LIQD: 60.0000 mL | Freq: Once | CUTANEOUS | Status: DC

## 2021-03-11 MED ORDER — PROPOFOL 500 MG/50ML IV EMUL
INTRAVENOUS | Status: AC
  Filled 2021-03-11: qty 50

## 2021-03-11 MED ORDER — ONDANSETRON HCL 4 MG/2ML IJ SOLN
INTRAMUSCULAR | Status: DC | PRN
  Administered 2021-03-11: 4 mg via INTRAVENOUS

## 2021-03-11 MED ORDER — SODIUM CHLORIDE 0.9% FLUSH: 3.0000 mL | Freq: Two times a day (BID) | INTRAVENOUS | Status: DC

## 2021-03-11 MED ORDER — MEPERIDINE HCL 25 MG/ML IJ SOLN: 6.2500 mg | INTRAMUSCULAR | Status: DC | PRN

## 2021-03-11 MED ORDER — ACETAMINOPHEN 500 MG PO TABS
ORAL_TABLET | ORAL | Status: AC
  Filled 2021-03-11: qty 2

## 2021-03-11 MED ORDER — ACETAMINOPHEN 325 MG RE SUPP: 650.0000 mg | RECTAL | Status: DC | PRN

## 2021-03-11 MED ORDER — ONDANSETRON HCL 4 MG/2ML IJ SOLN
INTRAMUSCULAR | Status: AC
  Filled 2021-03-11: qty 2

## 2021-03-11 MED ORDER — FENTANYL CITRATE (PF) 100 MCG/2ML IJ SOLN
INTRAMUSCULAR | Status: DC | PRN
  Administered 2021-03-11: 12.5 ug via INTRAVENOUS
  Administered 2021-03-11: 25 ug via INTRAVENOUS
  Administered 2021-03-11: 12.5 ug via INTRAVENOUS

## 2021-03-11 MED ORDER — FENTANYL CITRATE (PF) 100 MCG/2ML IJ SOLN
INTRAMUSCULAR | Status: AC
  Filled 2021-03-11: qty 2

## 2021-03-11 MED ORDER — CEFAZOLIN SODIUM-DEXTROSE 2-4 GM/100ML-% IV SOLN
INTRAVENOUS | Status: AC
  Filled 2021-03-11: qty 100

## 2021-03-11 MED ORDER — OXYCODONE HCL 5 MG/5ML PO SOLN: 5.0000 mg | Freq: Once | ORAL | Status: DC | PRN

## 2021-03-11 MED ORDER — PHENYLEPHRINE HCL (PRESSORS) 10 MG/ML IV SOLN
INTRAVENOUS | Status: DC | PRN
  Administered 2021-03-11 (×2): 40 ug via INTRAVENOUS

## 2021-03-11 MED ORDER — TRAMADOL HCL 50 MG PO TABS: 50.0000 mg | ORAL_TABLET | Freq: Four times a day (QID) | ORAL | 0 refills | Status: AC | PRN

## 2021-03-11 MED ORDER — GABAPENTIN 300 MG PO CAPS: 300.0000 mg | ORAL_CAPSULE | ORAL | Status: DC

## 2021-03-11 MED ORDER — MIDAZOLAM HCL 2 MG/2ML IJ SOLN: 0.5000 mg | Freq: Once | INTRAMUSCULAR | Status: DC | PRN

## 2021-03-11 MED ORDER — LACTATED RINGERS IV SOLN: INTRAVENOUS | Status: DC

## 2021-03-11 MED ORDER — BUPIVACAINE-EPINEPHRINE 0.25% -1:200000 IJ SOLN
INTRAMUSCULAR | Status: DC | PRN
  Administered 2021-03-11: 6 mL

## 2021-03-11 MED ORDER — PROPOFOL 500 MG/50ML IV EMUL
INTRAVENOUS | Status: DC | PRN
  Administered 2021-03-11: 75 ug/kg/min via INTRAVENOUS

## 2021-03-11 MED ORDER — PROPOFOL 10 MG/ML IV BOLUS
INTRAVENOUS | Status: DC | PRN
  Administered 2021-03-11 (×4): 20 mg via INTRAVENOUS

## 2021-03-11 MED ORDER — ACETAMINOPHEN 500 MG PO TABS: 1000.0000 mg | ORAL_TABLET | ORAL | Status: DC

## 2021-03-11 MED ORDER — ACETAMINOPHEN 325 MG PO TABS: 650.0000 mg | ORAL_TABLET | ORAL | Status: DC | PRN

## 2021-03-11 MED ORDER — LIDOCAINE HCL (PF) 2 % IJ SOLN
INTRAMUSCULAR | Status: AC
  Filled 2021-03-11: qty 5

## 2021-03-11 MED ORDER — 0.9 % SODIUM CHLORIDE (POUR BTL) OPTIME
TOPICAL | Status: DC | PRN
  Administered 2021-03-11: 500 mL

## 2021-03-11 MED ORDER — SODIUM CHLORIDE 0.9% FLUSH: 3.0000 mL | INTRAVENOUS | Status: DC | PRN

## 2021-03-11 MED ORDER — OXYCODONE HCL 5 MG PO TABS: 5.0000 mg | ORAL_TABLET | ORAL | Status: DC | PRN

## 2021-03-11 MED ORDER — BUPIVACAINE LIPOSOME 1.3 % IJ SUSP: 20.0000 mL | Freq: Once | INTRAMUSCULAR | Status: DC

## 2021-03-11 MED ORDER — KETOROLAC TROMETHAMINE 30 MG/ML IJ SOLN
INTRAMUSCULAR | Status: AC
  Filled 2021-03-11: qty 1

## 2021-03-11 SURGICAL SUPPLY — 33 items
BLADE SURG 15 STRL LF DISP TIS (BLADE) ×1 IMPLANT
BLADE SURG 15 STRL SS (BLADE) ×1
CHLORAPREP W/TINT 26 (MISCELLANEOUS) ×2 IMPLANT
COVER BACK TABLE 60X90IN (DRAPES) ×2 IMPLANT
COVER MAYO STAND STRL (DRAPES) ×2 IMPLANT
DERMABOND ADVANCED (GAUZE/BANDAGES/DRESSINGS) ×1
DERMABOND ADVANCED .7 DNX12 (GAUZE/BANDAGES/DRESSINGS) ×1 IMPLANT
DRAPE LAPAROTOMY 100X72 PEDS (DRAPES) ×2 IMPLANT
DRAPE UTILITY XL STRL (DRAPES) ×2 IMPLANT
ELECT REM PT RETURN 9FT ADLT (ELECTROSURGICAL) ×2
ELECTRODE REM PT RTRN 9FT ADLT (ELECTROSURGICAL) ×1 IMPLANT
GAUZE 4X4 16PLY ~~LOC~~+RFID DBL (SPONGE) ×2 IMPLANT
GAUZE SPONGE 4X4 12PLY STRL (GAUZE/BANDAGES/DRESSINGS) IMPLANT
GLOVE SURG ENC MOIS LTX SZ6 (GLOVE) ×2 IMPLANT
GLOVE SURG POLYISO LF SZ6 (GLOVE) ×2 IMPLANT
GLOVE SURG UNDER LTX SZ6.5 (GLOVE) ×2 IMPLANT
GLOVE SURG UNDER POLY LF SZ6 (GLOVE) ×2 IMPLANT
GLOVE SURG UNDER POLY LF SZ7 (GLOVE) ×2 IMPLANT
GOWN STRL REUS W/TWL LRG LVL3 (GOWN DISPOSABLE) ×4 IMPLANT
KIT TURNOVER CYSTO (KITS) ×2 IMPLANT
NEEDLE HYPO 25X1 1.5 SAFETY (NEEDLE) ×2 IMPLANT
NS IRRIG 500ML POUR BTL (IV SOLUTION) ×2 IMPLANT
PACK BASIN DAY SURGERY FS (CUSTOM PROCEDURE TRAY) ×2 IMPLANT
PENCIL SMOKE EVACUATOR (MISCELLANEOUS) ×2 IMPLANT
SPONGE T-LAP 18X18 ~~LOC~~+RFID (SPONGE) ×2 IMPLANT
SUT MNCRL AB 4-0 PS2 18 (SUTURE) ×2 IMPLANT
SUT VIC AB 3-0 SH 27 (SUTURE) ×1
SUT VIC AB 3-0 SH 27X BRD (SUTURE) ×1 IMPLANT
SYR BULB IRRIG 60ML STRL (SYRINGE) ×2 IMPLANT
SYR CONTROL 10ML LL (SYRINGE) ×2 IMPLANT
TOWEL OR 17X26 10 PK STRL BLUE (TOWEL DISPOSABLE) ×2 IMPLANT
TUBE CONNECTING 12X1/4 (SUCTIONS) ×2 IMPLANT
YANKAUER SUCT BULB TIP NO VENT (SUCTIONS) ×2 IMPLANT

## 2021-03-11 NOTE — Progress Notes (Signed)
Spoke with pt by phone and reviewed pre op instructions

## 2021-03-11 NOTE — Interval H&P Note (Signed)
History and Physical Interval Note:  03/11/2021 1:45 PM  Perry Norton  has presented today for surgery, with the diagnosis of subcutaneous mass.  The various methods of treatment have been discussed with the patient and family. After consideration of risks, benefits and other options for treatment, the patient has consented to  Procedure(s): EXCISION subcutaneous MASS left buttock (Left) as a surgical intervention.  The patient's history has been reviewed, patient examined, no change in status, stable for surgery.  I have reviewed the patient's chart and labs.  Questions were answered to the patient's satisfaction.     Lise Pincus Lollie Sails

## 2021-03-11 NOTE — Op Note (Signed)
Operative Note  Kizer Nobbe  [id number]  [id number]  03/11/2021   Surgeon: Phylliss Blakes MD   Procedure performed: Excision of 3 cm x 2 cm subcutaneous mass, left buttock   Preop diagnosis: Subcutaneous mass Post-op diagnosis/intraop findings: Same   Specimens: Left buttock mass Retained items: No EBL: Minimal cc Complications: none   Description of procedure: After obtaining informed consent the patient was taken to the operating room and placed in the right lateral decubitus position on the operating room table where monitored anesthesia care was initiated, preoperative antibiotics administered and a formal timeout performed.  The left buttock was prepped and draped in usual sterile fashion.  After infiltration with quarter percent Marcaine with epinephrine, a transverse elliptical incision was made to include the previous scar over the area of the mass on the left buttock.  Cautery and blunt dissection were then used to dissect through the soft tissues, freeing the mass from the surrounding attachments to the soft tissues.  The mass was excised intact.  This was handed off for pathology.  Hemostasis was ensured within the wound.  The incision was then closed with interrupted deep dermal 3-0 Vicryl, running subcuticular 4-0 Monocryl and Dermabond.  The patient was then returned to the supine position, awakened and brought to the recovery room in stable condition.   All counts were correct at the completion of the case.

## 2021-03-11 NOTE — Progress Notes (Signed)
Pt ride was unable to arrive until 5pm today pt refused to remain at surgical center we made every attempt to have pt remain here  Until ride arrived we offered to sit with pt outside we offered to provide food nothing was able to change his mind  Pt was given a very detailed explanation regarding post op safety and the need for an adult to accompany him home DR Jean Rosenthal also spoke with pt pt continued to refuse to stay signed AMA  form and walked out towards bus stop pt a&o x4 walking and talking without problem

## 2021-03-11 NOTE — Anesthesia Procedure Notes (Signed)
Procedure Name: MAC Date/Time: 03/11/2021 2:09 PM Performed by: Justice Rocher, CRNA Pre-anesthesia Checklist: Timeout performed, Patient being monitored, Suction available, Emergency Drugs available and Patient identified Patient Re-evaluated:Patient Re-evaluated prior to induction Oxygen Delivery Method: Simple face mask Preoxygenation: Pre-oxygenation with 100% oxygen Induction Type: IV induction Placement Confirmation: positive ETCO2 and breath sounds checked- equal and bilateral

## 2021-03-11 NOTE — Anesthesia Postprocedure Evaluation (Signed)
Anesthesia Post Note  Patient: Perry Norton  Procedure(s) Performed: EXCISION subcutaneous MASS left buttock (Left: Buttocks)     Patient location during evaluation: PACU Anesthesia Type: MAC Level of consciousness: awake and alert, patient cooperative and oriented Pain management: pain level controlled Vital Signs Assessment: post-procedure vital signs reviewed and stable Respiratory status: nonlabored ventilation, spontaneous breathing and respiratory function stable Cardiovascular status: blood pressure returned to baseline and stable Postop Assessment: no apparent nausea or vomiting, able to ambulate and adequate PO intake Anesthetic complications: no   No notable events documented.  Last Vitals:  Vitals:   03/11/21 1445 03/11/21 1459  BP: 108/83 115/86  Pulse:    Resp:  12  Temp: 36.9 C   SpO2:  100%    Last Pain:  Vitals:   03/11/21 1459  TempSrc:   PainSc: 0-No pain                 Liesl Simons,E. Cardelia Sassano

## 2021-03-11 NOTE — Discharge Instructions (Addendum)
GENERAL SURGERY: POST OP INSTRUCTIONS  EAT Gradually transition to a high fiber diet with a fiber supplement over the next few weeks after discharge.  Start with a pureed / full liquid diet (see below)  WALK Walk an hour a day (cumulative, not all at once).  Control your pain to do that.    CONTROL PAIN Control pain so that you can walk, sleep, tolerate sneezing/coughing, go up/down stairs.  HAVE A BOWEL MOVEMENT DAILY Keep your bowels regular to avoid problems.  OK to try a laxative to override constipation.  OK to use an antidairrheal to slow down diarrhea.  Call if not better after 2 tries  CALL IF YOU HAVE PROBLEMS/CONCERNS Call if you are still struggling despite following these instructions. Call if you have concerns not answered by these instructions    DIET: Follow a light bland diet & liquids the first 24 hours after arrival home, such as soup, liquids, starches, etc.  Be sure to drink plenty of fluids.  Quickly advance to a usual solid diet within a few days.  Avoid fast food or heavy meals as your are more likely to get nauseated or have irregular bowels.  A low-sugar, high-fiber diet for the rest of your life is ideal.   Take your usually prescribed home medications unless otherwise directed. PAIN CONTROL: Pain is best controlled by a usual combination of three different methods TOGETHER: Ice/Heat Over the counter pain medication Prescription pain medication Most patients will experience some swelling and bruising around the incisions.  Ice packs or heating pads (30-60 minutes up to 6 times a day) will help. Use ice for the first few days to help decrease swelling and bruising, then switch to heat to help relax tight/sore spots and speed recovery.  Some people prefer to use ice alone, heat alone, alternating between ice & heat.  Experiment to what works for you.  Swelling and bruising can take several weeks to resolve.   It is helpful to take an over-the-counter pain  medication regularly for the first few weeks.  Choose one of the following that works best for you: Naproxen (Aleve, etc)  Two 220mg  tabs twice a day OR Ibuprofen (Advil, etc) Three 200mg  tabs four times a day (every meal & bedtime) AND Acetaminophen (Tylenol, etc) 500-650mg  four times a day (every meal & bedtime) A  prescription for pain medication (such as oxycodone, hydrocodone, etc) should be given to you upon discharge.  Take your pain medication as prescribed.  If you are having problems/concerns with the prescription medicine (does not control pain, nausea, vomiting, rash, itching, etc), please call us [phone] to see if we need to switch you to a different pain medicine that will work better for you and/or control your side effect better. If you need a refill on your pain medication, please contact your pharmacy.  They will contact our office to request authorization. Prescriptions will not be filled after 5 pm or on week-ends. Avoid getting constipated.  Between the surgery and the pain medications, it is common to experience some constipation.  Increasing fluid intake and taking a fiber supplement (such as Metamucil, Citrucel, FiberCon, MiraLax, etc) 1-2 times a day regularly will usually help prevent this problem from occurring.  A mild laxative (prune juice, Milk of Magnesia, MiraLax, etc) should be taken according to package directions if there are no bowel movements after 48 hours.   Wash / shower every day, starting 2 days after surgery.  You may shower over the  skin glue which is waterproof.  Continue to shower over incision(s) after the dressing is off. No rubbing, scrubbing, lotions or ointments to incision. Do not submerge or soak incision.  Glue will flake off after 1-2 weeks.  You may leave the incision open to air.  You may replace a dressing/Band-Aid to cover the incision for comfort if you wish.   ACTIVITIES as tolerated:   You may resume regular (light) daily activities  beginning the next day--such as daily self-care, walking, climbing stairs--gradually increasing activities as tolerated.  If you can walk 30 minutes without difficulty, it is safe to try more intense activity such as jogging, treadmill, bicycling, low-impact aerobics, swimming, etc. Save the most intensive and strenuous activity for last such as sit-ups, heavy lifting, contact sports, etc  Refrain from any heavy lifting or straining until you are off narcotics for pain control.   DO NOT PUSH THROUGH PAIN.  Let pain be your guide: If it hurts to do something, don't do it.  Pain is your body warning you to avoid that activity for another week until the pain goes down. You may drive when you are no longer taking prescription pain medication, you can comfortably wear a seatbelt, and you can safely maneuver your car and apply brakes. You may have sexual intercourse when it is comfortable.  FOLLOW UP in our office Please call [facility] at [phone] to set up an appointment to see your surgeon in the office for a follow-up appointment approximately 2-3 weeks after your surgery. Make sure that you call for this appointment the day you arrive home to insure a convenient appointment time. 9. IF YOU HAVE DISABILITY OR FAMILY LEAVE FORMS, BRING THEM TO THE OFFICE FOR PROCESSING.  DO NOT GIVE THEM TO YOUR DOCTOR.   WHEN TO CALL us [phone]:     Post Anesthesia Home Care Instructions  Activity: Get plenty of rest for the remainder of the day. A responsible adult should stay with you for 24 hours following the procedure.  For the next 24 hours, DO NOT: -Drive a car -Advertising copywriter -Drink alcoholic beverages -Take any medication unless instructed by your physician -Make any legal decisions or sign important papers.  Meals: Start with liquid foods such as gelatin or soup. Progress to regular foods as tolerated. Avoid greasy, spicy, heavy foods. If nausea and/or vomiting occur, drink only clear  liquids until the nausea and/or vomiting subsides. Call your physician if vomiting continues.  Special Instructions/Symptoms: Your throat may feel dry or sore from the anesthesia or the breathing tube placed in your throat during surgery. If this causes discomfort, gargle with warm salt water. The discomfort should disappear within 24 hours.  If you had a scopolamine patch placed behind your ear for the management of post- operative nausea and/or vomiting:  1. The medication in the patch is effective for 72 hours, after which it should be removed.  Wrap patch in a tissue and discard in the trash. Wash hands thoroughly with soap and water. 2. You may remove the patch earlier than 72 hours if you experience unpleasant side effects which may include dry mouth, dizziness or visual disturbances. 3. Avoid touching the patch. Wash your hands with soap and water after contact with the patch.    Poor pain control Reactions / problems with new medications (rash/itching, nausea, etc)  Fever over 101.5 F (38.5 C) Worsening swelling or bruising Continued bleeding from incision. Increased pain, redness, or drainage from the incision Difficulty breathing /  swallowing   The clinic staff is available to answer your questions during regular business hours (8:30am-5pm).  Please don't hesitate to call and ask to speak to one of our nurses for clinical concerns.   If you have a medical emergency, go to the nearest emergency room or call 911.  A surgeon from [facility] is always on call at the [facility], [place] [address], [place], [place]  [address] ? MAIN: [phone] ? TOLL FREE: [phone] ?  FAX [phone] [email or web address]

## 2021-03-11 NOTE — Transfer of Care (Signed)
Immediate Anesthesia Transfer of Care Note  Patient: Perry Norton  Procedure(s) Performed: Procedure(s) (LRB): EXCISION subcutaneous MASS left buttock (Left)  Patient Location: PACU  Anesthesia Type: MAC  Level of Consciousness: awake, sedated, patient cooperative and responds to stimulation  Airway & Oxygen Therapy: Patient Spontanous Breathing and Patient on RA with soft FM  Post-op Assessment: Report given to PACU RN, Post -op Vital signs reviewed and stable and Patient moving all extremities  Post vital signs: Reviewed and stable  Complications: No apparent anesthesia complications

## 2021-03-11 NOTE — Anesthesia Preprocedure Evaluation (Addendum)
Anesthesia Evaluation  Patient identified by MRN, date of birth, ID band Patient awake    Reviewed: Allergy & Precautions, NPO status , Patient's Chart, lab work & pertinent test results  History of Anesthesia Complications Negative for: history of anesthetic complications  Airway Mallampati: I  TM Distance: >3 FB Neck ROM: Full    Dental  (+) Edentulous Upper, Edentulous Lower   Pulmonary Current SmokerPatient did not abstain from smoking.,    breath sounds clear to auscultation       Cardiovascular hypertension (not on medication),  Rhythm:Regular Rate:Normal     Neuro/Psych Seizures - (no longer takes meds), Poorly Controlled,  Depression    GI/Hepatic negative GI ROS, (+)     substance abuse  marijuana use,   Endo/Other  negative endocrine ROS  Renal/GU Renal InsufficiencyRenal disease (creat 1.37)     Musculoskeletal   Abdominal   Peds  Hematology  (+) HIV,   Anesthesia Other Findings   Reproductive/Obstetrics                            Anesthesia Physical Anesthesia Plan  ASA: 3  Anesthesia Plan: MAC   Post-op Pain Management:    Induction:   PONV Risk Score and Plan: 0 and Ondansetron  Airway Management Planned: Natural Airway and Simple Face Mask  Additional Equipment: None  Intra-op Plan:   Post-operative Plan:   Informed Consent: I have reviewed the patients History and Physical, chart, labs and discussed the procedure including the risks, benefits and alternatives for the proposed anesthesia with the patient or authorized representative who has indicated his/her understanding and acceptance.       Plan Discussed with: CRNA and Surgeon  Anesthesia Plan Comments:        Anesthesia Quick Evaluation

## 2021-03-12 ENCOUNTER — Encounter (HOSPITAL_BASED_OUTPATIENT_CLINIC_OR_DEPARTMENT_OTHER): Payer: Self-pay | Admitting: Surgery

## 2021-03-13 LAB — SURGICAL PATHOLOGY

## 2021-04-21 DIAGNOSIS — M5412 Radiculopathy, cervical region: Secondary | ICD-10-CM | POA: Diagnosis not present

## 2021-04-21 DIAGNOSIS — G40309 Generalized idiopathic epilepsy and epileptic syndromes, not intractable, without status epilepticus: Secondary | ICD-10-CM | POA: Diagnosis not present

## 2021-04-21 DIAGNOSIS — B20 Human immunodeficiency virus [HIV] disease: Secondary | ICD-10-CM | POA: Diagnosis not present

## 2021-06-03 ENCOUNTER — Other Ambulatory Visit: Payer: Self-pay

## 2021-06-03 ENCOUNTER — Emergency Department (HOSPITAL_COMMUNITY): Payer: Medicaid Other

## 2021-06-03 ENCOUNTER — Emergency Department (HOSPITAL_COMMUNITY)
Admission: EM | Admit: 2021-06-03 | Discharge: 2021-06-03 | Discharge status: Against Medical Advice | Payer: Medicaid Other | Attending: Emergency Medicine | Admitting: Emergency Medicine

## 2021-06-03 ENCOUNTER — Encounter (HOSPITAL_COMMUNITY): Payer: Self-pay | Admitting: Oncology

## 2021-06-03 DIAGNOSIS — I129 Hypertensive chronic kidney disease with stage 1 through stage 4 chronic kidney disease, or unspecified chronic kidney disease: Secondary | ICD-10-CM | POA: Insufficient documentation

## 2021-06-03 DIAGNOSIS — R569 Unspecified convulsions: Secondary | ICD-10-CM | POA: Diagnosis not present

## 2021-06-03 DIAGNOSIS — Z79899 Other long term (current) drug therapy: Secondary | ICD-10-CM | POA: Diagnosis not present

## 2021-06-03 DIAGNOSIS — M542 Cervicalgia: Secondary | ICD-10-CM | POA: Insufficient documentation

## 2021-06-03 DIAGNOSIS — W01198A Fall on same level from slipping, tripping and stumbling with subsequent striking against other object, initial encounter: Secondary | ICD-10-CM | POA: Diagnosis not present

## 2021-06-03 DIAGNOSIS — G8929 Other chronic pain: Secondary | ICD-10-CM | POA: Diagnosis not present

## 2021-06-03 DIAGNOSIS — S0083XA Contusion of other part of head, initial encounter: Secondary | ICD-10-CM | POA: Diagnosis not present

## 2021-06-03 DIAGNOSIS — R410 Disorientation, unspecified: Secondary | ICD-10-CM | POA: Diagnosis not present

## 2021-06-03 DIAGNOSIS — R0902 Hypoxemia: Secondary | ICD-10-CM | POA: Diagnosis not present

## 2021-06-03 DIAGNOSIS — F17210 Nicotine dependence, cigarettes, uncomplicated: Secondary | ICD-10-CM | POA: Insufficient documentation

## 2021-06-03 DIAGNOSIS — I10 Essential (primary) hypertension: Secondary | ICD-10-CM | POA: Diagnosis not present

## 2021-06-03 DIAGNOSIS — Y900 Blood alcohol level of less than 20 mg/100 ml: Secondary | ICD-10-CM | POA: Insufficient documentation

## 2021-06-03 DIAGNOSIS — G40909 Epilepsy, unspecified, not intractable, without status epilepticus: Secondary | ICD-10-CM | POA: Diagnosis not present

## 2021-06-03 DIAGNOSIS — N189 Chronic kidney disease, unspecified: Secondary | ICD-10-CM | POA: Diagnosis not present

## 2021-06-03 DIAGNOSIS — S0990XA Unspecified injury of head, initial encounter: Secondary | ICD-10-CM | POA: Diagnosis not present

## 2021-06-03 DIAGNOSIS — R404 Transient alteration of awareness: Secondary | ICD-10-CM | POA: Diagnosis not present

## 2021-06-03 DIAGNOSIS — Z5321 Procedure and treatment not carried out due to patient leaving prior to being seen by health care provider: Secondary | ICD-10-CM | POA: Diagnosis not present

## 2021-06-03 LAB — CBC WITH DIFFERENTIAL/PLATELET
Abs Immature Granulocytes: 0.02 10*3/uL (ref 0.00–0.07)
Basophils Absolute: 0 10*3/uL (ref 0.0–0.1)
Basophils Relative: 1 %
Eosinophils Absolute: 0 10*3/uL (ref 0.0–0.5)
Eosinophils Relative: 0 %
HCT: 42.5 % (ref 39.0–52.0)
Hemoglobin: 13.9 g/dL (ref 13.0–17.0)
Immature Granulocytes: 0 %
Lymphocytes Relative: 12 %
Lymphs Abs: 1 10*3/uL (ref 0.7–4.0)
MCH: 30.3 pg (ref 26.0–34.0)
MCHC: 32.7 g/dL (ref 30.0–36.0)
MCV: 92.6 fL (ref 80.0–100.0)
Monocytes Absolute: 0.5 10*3/uL (ref 0.1–1.0)
Monocytes Relative: 6 %
Neutro Abs: 7 10*3/uL (ref 1.7–7.7)
Neutrophils Relative %: 81 %
Platelets: 216 10*3/uL (ref 150–400)
RBC: 4.59 MIL/uL (ref 4.22–5.81)
RDW: 13.1 % (ref 11.5–15.5)
WBC: 8.6 10*3/uL (ref 4.0–10.5)
nRBC: 0 % (ref 0.0–0.2)

## 2021-06-03 LAB — COMPREHENSIVE METABOLIC PANEL
ALT: 20 U/L (ref 0–44)
AST: 26 U/L (ref 15–41)
Albumin: 4.6 g/dL (ref 3.5–5.0)
Alkaline Phosphatase: 70 U/L (ref 38–126)
Anion gap: 10 (ref 5–15)
BUN: 8 mg/dL (ref 6–20)
CO2: 28 mmol/L (ref 22–32)
Calcium: 9.4 mg/dL (ref 8.9–10.3)
Chloride: 103 mmol/L (ref 98–111)
Creatinine, Ser: 1.21 mg/dL (ref 0.61–1.24)
GFR, Estimated: >60 mL/min (ref >60)
Glucose, Bld: 135 mg/dL — ABNORMAL HIGH (ref 70–99)
Potassium: 3.9 mmol/L (ref 3.5–5.1)
Sodium: 141 mmol/L (ref 135–145)
Total Bilirubin: 0.3 mg/dL (ref 0.3–1.2)
Total Protein: 7.7 g/dL (ref 6.5–8.1)

## 2021-06-03 LAB — ETHANOL: Alcohol, Ethyl (B): <10 mg/dL (ref <10)

## 2021-06-03 MED ORDER — LEVETIRACETAM 500 MG PO TABS: 500.0000 mg | ORAL_TABLET | Freq: Two times a day (BID) | ORAL | 0 refills | Status: DC

## 2021-06-03 MED ORDER — LEVETIRACETAM IN NACL 1500 MG/100ML IV SOLN
1500.0000 mg | Freq: Once | INTRAVENOUS | Status: DC
  Filled 2021-06-03: qty 100

## 2021-06-03 MED ORDER — SODIUM CHLORIDE 0.9 % IV SOLN
2500.0000 mg | Freq: Once | INTRAVENOUS | Status: AC
  Administered 2021-06-03: 2500 mg via INTRAVENOUS
  Filled 2021-06-03: qty 25

## 2021-06-03 NOTE — ED Notes (Addendum)
18 G IV established to right AC, blood drawn however pt refused to let IV stay in place despite being told rationale of why it is needed. Removed IV.  Adelina Mings, [place] is aware.

## 2021-06-03 NOTE — ED Triage Notes (Signed)
Pt is belligerent, walking outside to smoke after being asked not to.  Pt is not cooperating w/ standard care.

## 2021-06-03 NOTE — Progress Notes (Signed)
ON CALL PHONE CONSULT  Call from [facility]  Discussion Pt with h/o seizures, on Vimpat 200 twice daily, [facility], previously dismissed from Dr. Laurier Nancy practice, coming in with breakthrough seizure.  Had another seizure in the ER.  Reports compliance to medications.  No alcohol or drug use reported.  ED wanted recommendations on AEDs.   Recs: -Utox -Load Keppra 2500 mg IV x1  -Start Keppra 500 twice daily after the load -Continue Vimpat 200 twice daily -If has more seizures or does not return back to baseline, will need to be transferred to [facility] for further evaluation and continuous EEG monitoring-which is not available at [facility] hospital. -Seizure precautions to be discussed with patient and family upon discharge. (Below) Please call the on-call neurologist with any further questions if needed during the course of the night.  -- Milon Dikes, MD Neurologist [facility] Pager: [phone]   SEIZURE PRECAUTIONS Per [facility] statutes, patients with seizures are not allowed to drive until they have been seizure-free for six months.   Use caution when using heavy equipment or power tools. Avoid working on ladders or at heights. Take showers instead of baths. Ensure the water temperature is not too high on the home water heater. Do not go swimming alone. Do not lock yourself in a room alone (i.e. bathroom). When caring for infants or small children, sit down when holding, feeding, or changing them to minimize risk of injury to the child in the event you have a seizure. Maintain good sleep hygiene. Avoid alcohol.    If patient has another seizure, call 911 and bring them back to the ED if: A.  The seizure lasts longer than 5 minutes.      B.  The patient doesn't wake shortly after the seizure or has new problems such as difficulty seeing, speaking or moving following the seizure C.  The patient was injured during the  seizure D.  The patient has a temperature over 102 F (39C) E.  The patient vomited during the seizure and now is having trouble breathing
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
## 2021-06-03 NOTE — ED Notes (Signed)
Entered room to find pt fully dressed, all monitoring equipment on the floor and pt actively having a seizure.  IV placed, [place], [place] notified.  Pt remains post ictal at this time.
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
## 2021-06-04 ENCOUNTER — Telehealth: Payer: Self-pay

## 2021-06-04 NOTE — Telephone Encounter (Signed)
Transition Care Management Unsuccessful Follow-up Telephone Call  Date of discharge and from where:  12/13/202-[place]  Attempts:  1st Attempt  Reason for unsuccessful TCM follow-up call:  Left voice message-No PCP-Possible referral needed

## 2021-06-05 NOTE — Telephone Encounter (Signed)
Transition Care Management Follow-up Telephone Call Date of discharge and from where: 06/03/2021 from [facility] How have you been since you were released from the hospital? Pt stated that he is feeling okay. Pt still has a headache and dizzy.  Any questions or concerns? No  Items Reviewed: Did the pt receive and understand the discharge instructions provided? Yes  Medications obtained and verified? Yes  Other? No  Any new allergies since your discharge? No  Dietary orders reviewed? No Do you have support at home? Yes   Functional Questionnaire: (I = Independent and D = Dependent) ADLs: I Bathing/Dressing- I Meal Prep- I Eating- I Maintaining continence- I Transferring/Ambulation- I Managing Meds- I   Follow up appointments reviewed: PCP Hospital f/u appt confirmed? No  Specialist Hospital f/u appt confirmed? No   Are transportation arrangements needed? No  If their condition worsens, is the pt aware to call PCP or go to the Emergency Dept.? Yes Was the patient provided with contact information for the PCP's office or ED? Yes Was to pt encouraged to call back with questions or concerns? Yes

## 2021-06-11 DIAGNOSIS — M5412 Radiculopathy, cervical region: Secondary | ICD-10-CM | POA: Diagnosis not present

## 2021-06-13 DIAGNOSIS — M542 Cervicalgia: Secondary | ICD-10-CM | POA: Diagnosis not present

## 2021-06-14 IMAGING — CT CT HEAD W/O CM
4 series · 17 of 47 positions shown, 19 images · non-contrast
Comparison: 06/26/2018

CLINICAL DATA: Fall with syncope six days ago. Headache, difficulty
walking and confusion since then.

EXAM:
CT HEAD WITHOUT CONTRAST
TECHNIQUE: Contiguous axial images were obtained from the base of the skull
through the vertex without intravenous contrast.

[Series 3: head wo · axial · 0.40mm/px · z∈[-108,+16]mm · 7 of 35 slices shown, 9 images]
[im 5/35  brain]
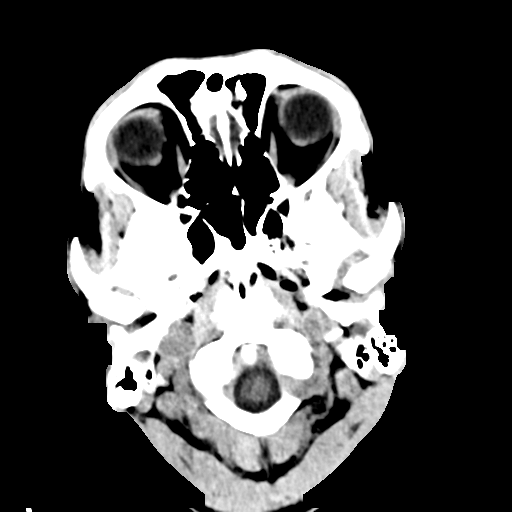
[im 5/35  bone]
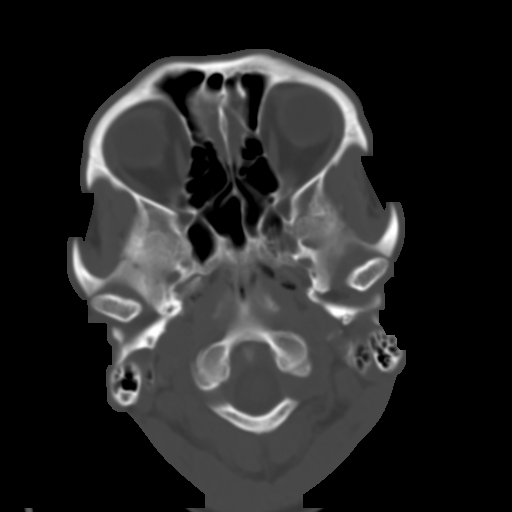
[im 9/35  brain]
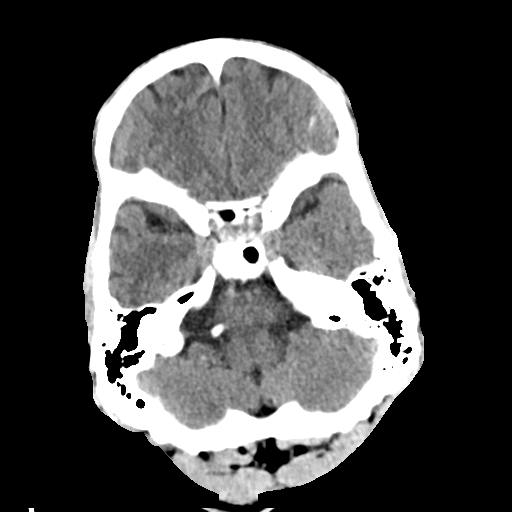
[im 13/35  brain]
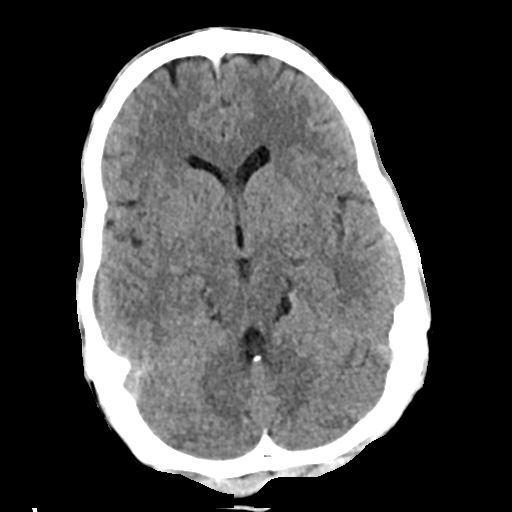
[im 18/35  brain]
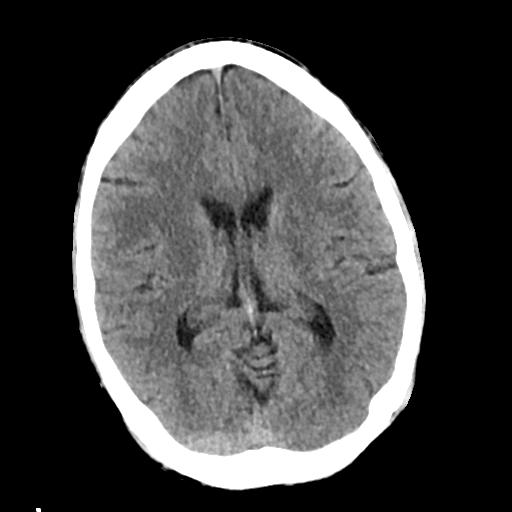
[im 22/35  brain]
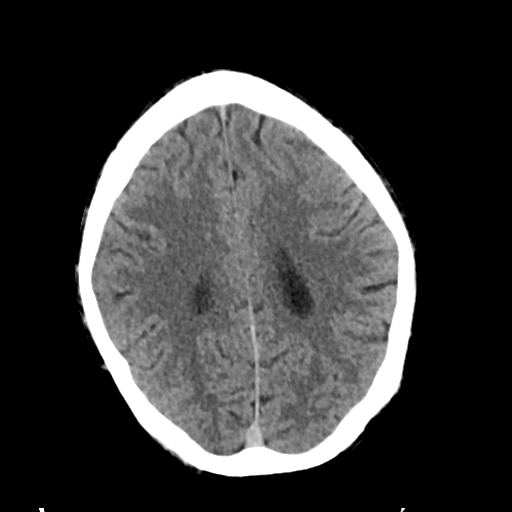
[im 22/35  bone]
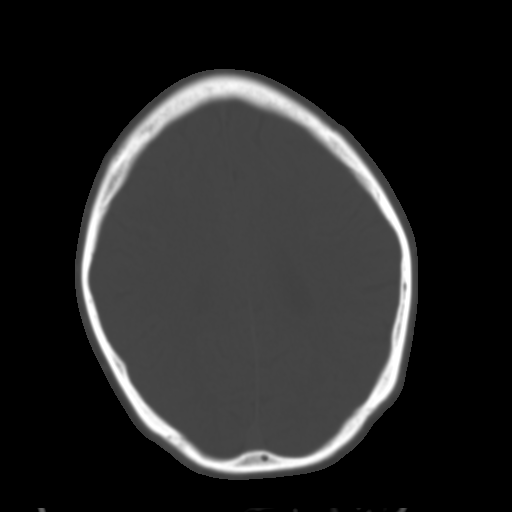
[im 26/35  brain]
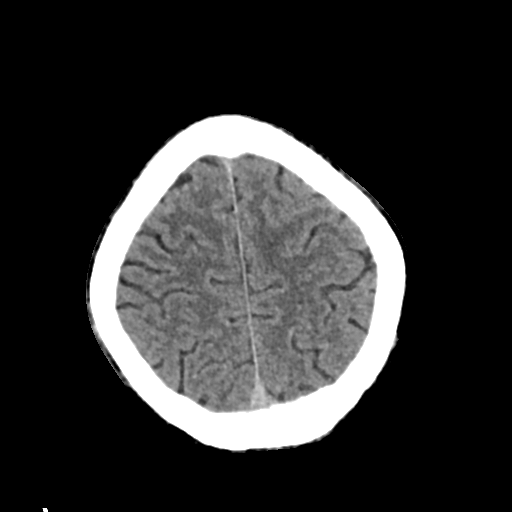
[im 30/35  brain]
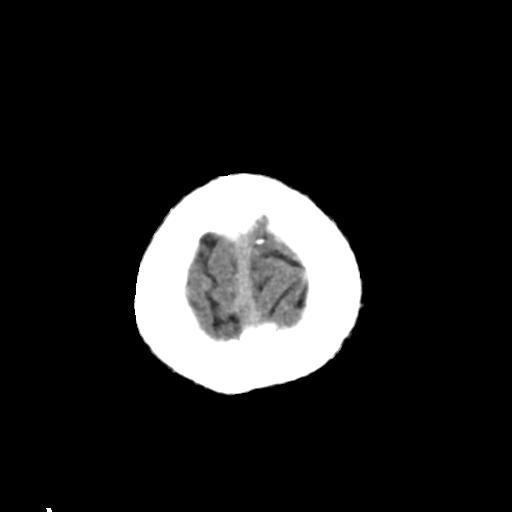

[Series 4: head bone · axial · 0.40mm/px · z∈[-112,-52]mm · 4 of 87 slices shown]
[im 9/87  bone]
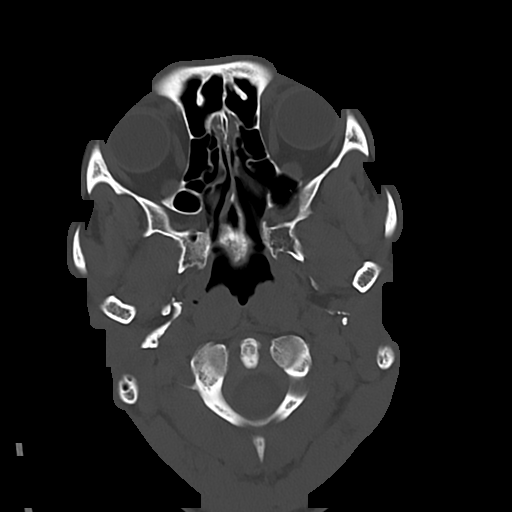
[im 18/87  bone]
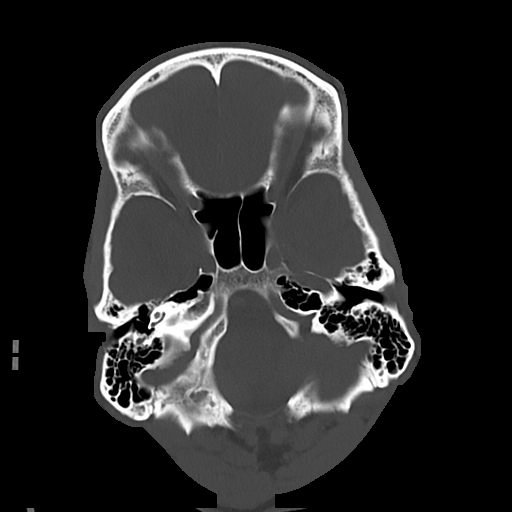
[im 26/87  bone]
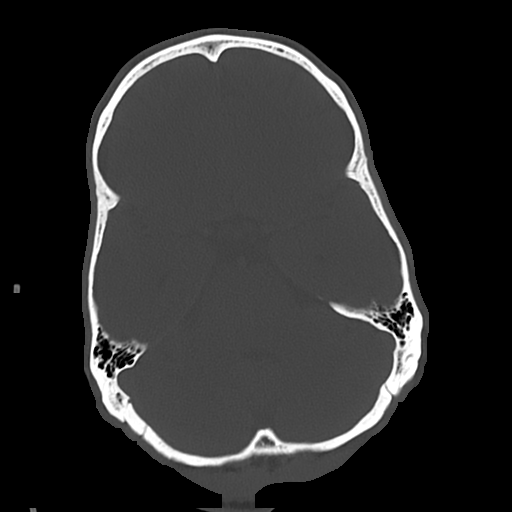
[im 39/87  bone]
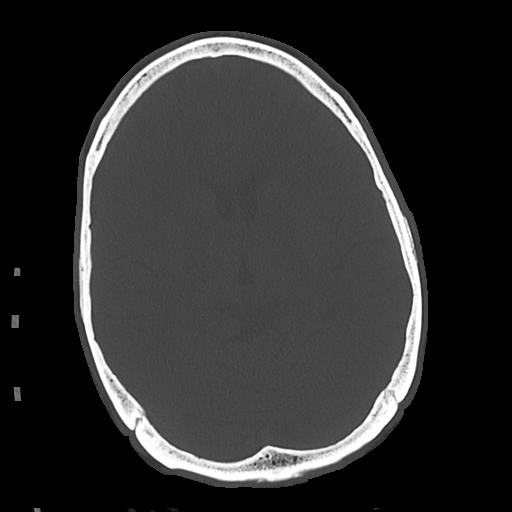

[Series 5: cor soft · coronal · 0.34mm/px · 3 of 71 slices shown]
[im 25/71  brain]
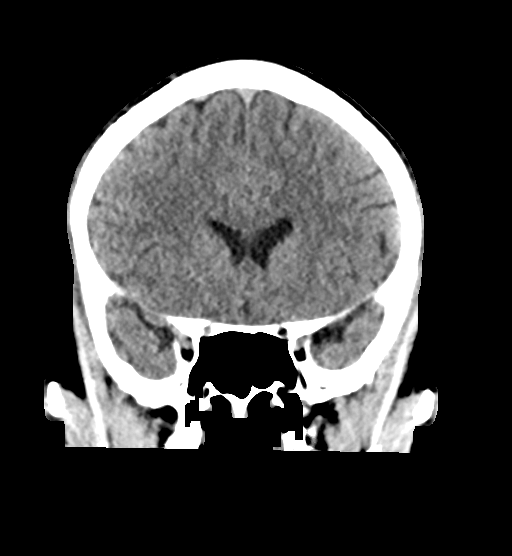
[im 32/71  brain]
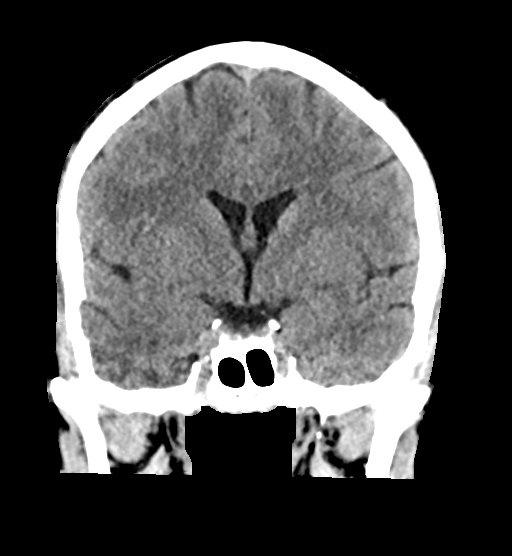
[im 39/71  brain]
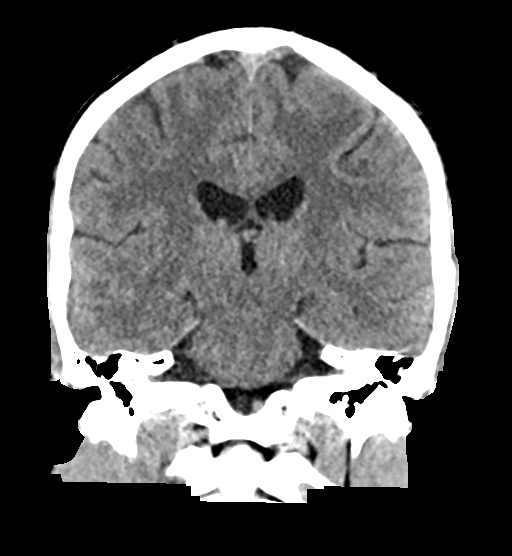

[Series 6: sag soft · sagittal · 0.37mm/px · 3 of 59 slices shown]
[im 20/59  brain]
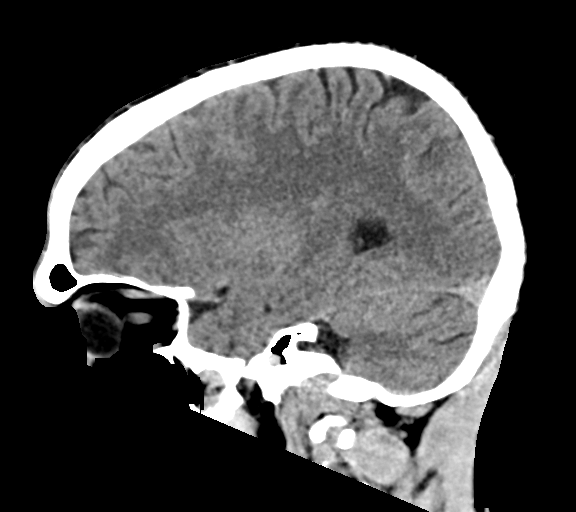
[im 30/59  brain]
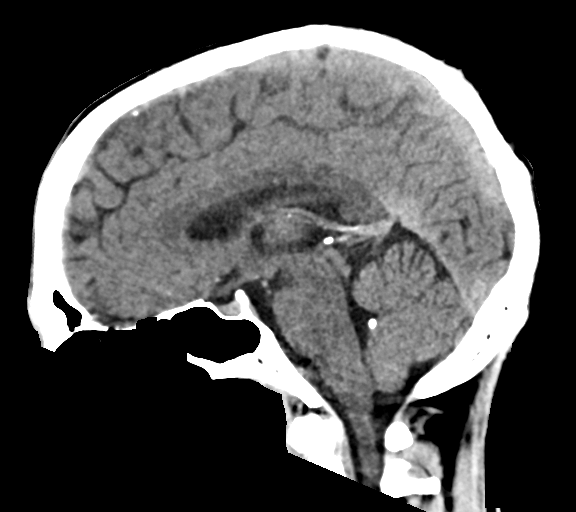
[im 39/59  brain]
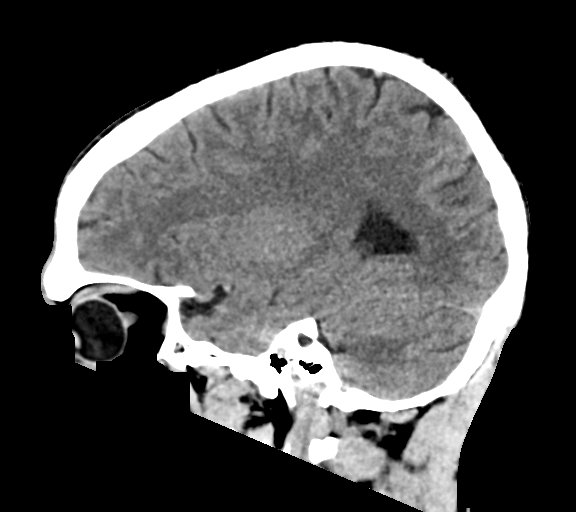

[17 of 47 positions shown; findings below may reference images not displayed]

FINDINGS: Brain: The brain shows a normal appearance without evidence of
malformation, atrophy, old or acute small or large vessel
infarction, mass lesion, hemorrhage, hydrocephalus or extra-axial
collection.

Vascular: No hyperdense vessel. No evidence of atherosclerotic
calcification.

Skull: Normal.  No traumatic finding.  No focal bone lesion.

Sinuses/Orbits: Sinuses are clear. Orbits appear normal. Mastoids
are clear.

Other: None significant
IMPRESSION: Normal head CT.

## 2021-06-18 DIAGNOSIS — R4689 Other symptoms and signs involving appearance and behavior: Secondary | ICD-10-CM | POA: Diagnosis not present

## 2021-07-07 DIAGNOSIS — M5412 Radiculopathy, cervical region: Secondary | ICD-10-CM | POA: Diagnosis not present

## 2021-07-10 DIAGNOSIS — M50021 Cervical disc disorder at C4-C5 level with myelopathy: Secondary | ICD-10-CM | POA: Diagnosis not present

## 2021-07-10 DIAGNOSIS — M4712 Other spondylosis with myelopathy, cervical region: Secondary | ICD-10-CM | POA: Diagnosis not present

## 2021-07-10 DIAGNOSIS — M4802 Spinal stenosis, cervical region: Secondary | ICD-10-CM | POA: Diagnosis not present

## 2021-07-11 ENCOUNTER — Ambulatory Visit: Payer: Medicaid Other | Admitting: Infectious Disease

## 2021-07-11 DIAGNOSIS — M25511 Pain in right shoulder: Secondary | ICD-10-CM

## 2021-07-11 DIAGNOSIS — M5020 Other cervical disc displacement, unspecified cervical region: Secondary | ICD-10-CM | POA: Diagnosis not present

## 2021-07-11 DIAGNOSIS — G9520 Unspecified cord compression: Secondary | ICD-10-CM | POA: Diagnosis not present

## 2021-07-11 DIAGNOSIS — M4802 Spinal stenosis, cervical region: Secondary | ICD-10-CM | POA: Diagnosis not present

## 2021-07-11 DIAGNOSIS — M5412 Radiculopathy, cervical region: Secondary | ICD-10-CM | POA: Diagnosis not present

## 2021-07-11 HISTORY — DX: Pain in right shoulder: M25.511

## 2021-07-14 ENCOUNTER — Ambulatory Visit: Payer: Medicaid Other | Admitting: Infectious Disease

## 2021-07-15 ENCOUNTER — Ambulatory Visit: Payer: Medicaid Other | Admitting: Infectious Disease

## 2021-07-17 DIAGNOSIS — G9520 Unspecified cord compression: Secondary | ICD-10-CM

## 2021-07-17 HISTORY — DX: Unspecified cord compression: G95.20
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
## 2021-07-28 ENCOUNTER — Ambulatory Visit (INDEPENDENT_AMBULATORY_CARE_PROVIDER_SITE_OTHER): Payer: Medicaid Other

## 2021-07-28 ENCOUNTER — Encounter: Payer: Self-pay | Admitting: Infectious Disease

## 2021-07-28 ENCOUNTER — Other Ambulatory Visit (HOSPITAL_COMMUNITY): Payer: Self-pay

## 2021-07-28 ENCOUNTER — Ambulatory Visit: Payer: Medicaid Other | Admitting: Infectious Disease

## 2021-07-28 ENCOUNTER — Other Ambulatory Visit: Payer: Self-pay

## 2021-07-28 VITALS — BP 126/81 | HR 111 | Temp 98.1°F | Wt 173.2 lb

## 2021-07-28 DIAGNOSIS — G40309 Generalized idiopathic epilepsy and epileptic syndromes, not intractable, without status epilepticus: Secondary | ICD-10-CM

## 2021-07-28 DIAGNOSIS — G9520 Unspecified cord compression: Secondary | ICD-10-CM | POA: Diagnosis not present

## 2021-07-28 DIAGNOSIS — E559 Vitamin D deficiency, unspecified: Secondary | ICD-10-CM | POA: Diagnosis not present

## 2021-07-28 DIAGNOSIS — L089 Local infection of the skin and subcutaneous tissue, unspecified: Secondary | ICD-10-CM | POA: Diagnosis not present

## 2021-07-28 DIAGNOSIS — A539 Syphilis, unspecified: Secondary | ICD-10-CM | POA: Diagnosis not present

## 2021-07-28 DIAGNOSIS — Z23 Encounter for immunization: Secondary | ICD-10-CM

## 2021-07-28 DIAGNOSIS — L720 Epidermal cyst: Secondary | ICD-10-CM | POA: Diagnosis not present

## 2021-07-28 DIAGNOSIS — Z113 Encounter for screening for infections with a predominantly sexual mode of transmission: Secondary | ICD-10-CM | POA: Diagnosis not present

## 2021-07-28 DIAGNOSIS — I10 Essential (primary) hypertension: Secondary | ICD-10-CM

## 2021-07-28 DIAGNOSIS — B20 Human immunodeficiency virus [HIV] disease: Secondary | ICD-10-CM | POA: Diagnosis not present

## 2021-07-28 HISTORY — DX: Vitamin D deficiency, unspecified: E55.9

## 2021-07-28 MED ORDER — BIKTARVY 50-200-25 MG PO TABS
1.0000 | ORAL_TABLET | Freq: Every day | ORAL | 11 refills | Status: DC
  Filled 2021-07-28 (×2): qty 30, 30d supply, fill #0
  Filled 2021-08-20: qty 30, 30d supply, fill #1
  Filled 2021-08-25: qty 30, 30d supply, fill #0
  Filled 2021-09-15 – 2021-09-22 (×2): qty 30, 30d supply, fill #1

## 2021-07-28 NOTE — Progress Notes (Signed)
° °  Covid-19 Vaccination Clinic  Name:  Perry Norton    MRN: [id number] DOB: December 26, 1975  07/28/2021  [name] was observed post Covid-19 immunization for 15 minutes without incident. He was provided with Vaccine Information Sheet and instruction to access the V-Safe system.   [name] was instructed to call 911 with any severe reactions post vaccine: Difficulty breathing  Swelling of face and throat  A fast heartbeat  A bad rash all over body  Dizziness and weakness   Immunizations Administered     Name Date Dose VIS Date Route   Pfizer Covid-19 Vaccine Bivalent Booster 07/28/2021 11:44 AM 0.3 mL 02/19/2021 Intramuscular   Manufacturer: [employer], [employer]   Lot: [id number]   NDC: [phone]      Shawana Knoch Lesli Albee, CMA

## 2021-07-29 DIAGNOSIS — G9520 Unspecified cord compression: Secondary | ICD-10-CM | POA: Diagnosis not present

## 2021-07-29 LAB — C. TRACHOMATIS/N. GONORRHOEAE RNA
C. trachomatis RNA, TMA: NOT DETECTED
N. gonorrhoeae RNA, TMA: NOT DETECTED

## 2021-07-30 DIAGNOSIS — B20 Human immunodeficiency virus [HIV] disease: Secondary | ICD-10-CM | POA: Diagnosis not present

## 2021-07-30 DIAGNOSIS — F17210 Nicotine dependence, cigarettes, uncomplicated: Secondary | ICD-10-CM | POA: Diagnosis not present

## 2021-07-30 DIAGNOSIS — G43909 Migraine, unspecified, not intractable, without status migrainosus: Secondary | ICD-10-CM | POA: Diagnosis not present

## 2021-07-30 DIAGNOSIS — R569 Unspecified convulsions: Secondary | ICD-10-CM | POA: Diagnosis not present

## 2021-07-30 DIAGNOSIS — M50021 Cervical disc disorder at C4-C5 level with myelopathy: Secondary | ICD-10-CM | POA: Diagnosis not present

## 2021-07-30 DIAGNOSIS — G9520 Unspecified cord compression: Secondary | ICD-10-CM | POA: Diagnosis not present

## 2021-07-30 LAB — COMPLETE METABOLIC PANEL WITH GFR
AG Ratio: 1.9 (calc) (ref 1.0–2.5)
ALT: 16 U/L (ref 9–46)
AST: 20 U/L (ref 10–40)
Albumin: 4.6 g/dL (ref 3.6–5.1)
Alkaline phosphatase (APISO): 67 U/L (ref 36–130)
BUN/Creatinine Ratio: 7 (calc) (ref 6–22)
BUN: 9 mg/dL (ref 7–25)
CO2: 32 mmol/L (ref 20–32)
Calcium: 9.5 mg/dL (ref 8.6–10.3)
Chloride: 102 mmol/L (ref 98–110)
Creat: 1.36 mg/dL — ABNORMAL HIGH (ref 0.60–1.29)
Globulin: 2.4 g/dL (calc) (ref 1.9–3.7)
Glucose, Bld: 117 mg/dL — ABNORMAL HIGH (ref 65–99)
Potassium: 4.1 mmol/L (ref 3.5–5.3)
Sodium: 139 mmol/L (ref 135–146)
Total Bilirubin: 0.4 mg/dL (ref 0.2–1.2)
Total Protein: 7 g/dL (ref 6.1–8.1)
eGFR: 66 mL/min/{1.73_m2} (ref >=60)

## 2021-07-30 LAB — CBC WITH DIFFERENTIAL/PLATELET
Absolute Monocytes: 515 cells/uL (ref 200–950)
Basophils Absolute: 49 cells/uL (ref 0–200)
Basophils Relative: 1 %
Eosinophils Absolute: 78 cells/uL (ref 15–500)
Eosinophils Relative: 1.6 %
HCT: 41.2 % (ref 38.5–50.0)
Hemoglobin: 13.9 g/dL (ref 13.2–17.1)
Lymphs Abs: 1676 cells/uL (ref 850–3900)
MCH: 30.2 pg (ref 27.0–33.0)
MCHC: 33.7 g/dL (ref 32.0–36.0)
MCV: 89.6 fL (ref 80.0–100.0)
MPV: 10.8 fL (ref 7.5–12.5)
Monocytes Relative: 10.5 %
Neutro Abs: 2582 cells/uL (ref 1500–7800)
Neutrophils Relative %: 52.7 %
Platelets: 221 10*3/uL (ref 140–400)
RBC: 4.6 10*6/uL (ref 4.20–5.80)
RDW: 12.8 % (ref 11.0–15.0)
Total Lymphocyte: 34.2 %
WBC: 4.9 10*3/uL (ref 3.8–10.8)

## 2021-07-30 LAB — LIPID PANEL
Cholesterol: 186 mg/dL (ref <200)
HDL: 48 mg/dL (ref >=40)
LDL Cholesterol (Calc): 113 mg/dL (calc) — ABNORMAL HIGH
Non-HDL Cholesterol (Calc): 138 mg/dL (calc) — ABNORMAL HIGH (ref <130)
Total CHOL/HDL Ratio: 3.9 (calc) (ref <5.0)
Triglycerides: 136 mg/dL (ref <150)

## 2021-07-30 LAB — RPR: RPR Ser Ql: REACTIVE — AB

## 2021-07-30 LAB — T-HELPER CELLS (CD4) COUNT (NOT AT ARMC)
Absolute CD4: 609 cells/uL (ref 490–1740)
CD4 T Helper %: 36 % (ref 30–61)
Total lymphocyte count: 1712 cells/uL (ref 850–3900)

## 2021-07-30 LAB — GC/CHLAMYDIA PROBE, AMP (THROAT)
Chlamydia trachomatis RNA: NOT DETECTED
Neisseria gonorrhoeae RNA: NOT DETECTED

## 2021-07-30 LAB — HIV-1 RNA QUANT-NO REFLEX-BLD
HIV 1 RNA Quant: 21 Copies/mL — ABNORMAL HIGH
HIV-1 RNA Quant, Log: 1.31 Log cps/mL — ABNORMAL HIGH

## 2021-07-30 LAB — FLUORESCENT TREPONEMAL AB(FTA)-IGG-BLD: Fluorescent Treponemal ABS: REACTIVE — AB

## 2021-07-30 LAB — RPR TITER: RPR Titer: 1:8 {titer} — ABNORMAL HIGH

## 2021-07-31 ENCOUNTER — Other Ambulatory Visit (HOSPITAL_COMMUNITY): Payer: Self-pay

## 2021-08-20 ENCOUNTER — Other Ambulatory Visit (HOSPITAL_COMMUNITY): Payer: Self-pay

## 2021-08-25 ENCOUNTER — Other Ambulatory Visit (HOSPITAL_COMMUNITY): Payer: Self-pay

## 2021-08-25 ENCOUNTER — Other Ambulatory Visit: Payer: Self-pay

## 2021-08-26 ENCOUNTER — Other Ambulatory Visit (HOSPITAL_COMMUNITY): Payer: Self-pay

## 2021-08-27 DIAGNOSIS — R4689 Other symptoms and signs involving appearance and behavior: Secondary | ICD-10-CM | POA: Diagnosis not present

## 2021-08-29 DIAGNOSIS — Z4789 Encounter for other orthopedic aftercare: Secondary | ICD-10-CM | POA: Diagnosis not present

## 2021-08-29 DIAGNOSIS — Z981 Arthrodesis status: Secondary | ICD-10-CM | POA: Diagnosis not present

## 2021-09-15 ENCOUNTER — Other Ambulatory Visit (HOSPITAL_COMMUNITY): Payer: Self-pay

## 2021-09-16 DIAGNOSIS — R079 Chest pain, unspecified: Secondary | ICD-10-CM | POA: Diagnosis not present

## 2021-09-16 DIAGNOSIS — R569 Unspecified convulsions: Secondary | ICD-10-CM | POA: Diagnosis not present

## 2021-09-16 DIAGNOSIS — I499 Cardiac arrhythmia, unspecified: Secondary | ICD-10-CM | POA: Diagnosis not present

## 2021-09-16 DIAGNOSIS — R0789 Other chest pain: Secondary | ICD-10-CM | POA: Diagnosis not present

## 2021-09-16 DIAGNOSIS — G40901 Epilepsy, unspecified, not intractable, with status epilepticus: Secondary | ICD-10-CM | POA: Diagnosis not present

## 2021-09-22 ENCOUNTER — Other Ambulatory Visit (HOSPITAL_COMMUNITY): Payer: Self-pay

## 2021-09-22 DIAGNOSIS — G40309 Generalized idiopathic epilepsy and epileptic syndromes, not intractable, without status epilepticus: Secondary | ICD-10-CM | POA: Diagnosis not present

## 2021-09-22 DIAGNOSIS — F1010 Alcohol abuse, uncomplicated: Secondary | ICD-10-CM | POA: Insufficient documentation

## 2021-09-22 DIAGNOSIS — B20 Human immunodeficiency virus [HIV] disease: Secondary | ICD-10-CM | POA: Diagnosis not present

## 2021-09-22 DIAGNOSIS — R079 Chest pain, unspecified: Secondary | ICD-10-CM | POA: Diagnosis not present

## 2021-09-22 DIAGNOSIS — G40009 Localization-related (focal) (partial) idiopathic epilepsy and epileptic syndromes with seizures of localized onset, not intractable, without status epilepticus: Secondary | ICD-10-CM | POA: Diagnosis not present

## 2021-09-22 DIAGNOSIS — F39 Unspecified mood [affective] disorder: Secondary | ICD-10-CM | POA: Diagnosis not present

## 2021-09-22 DIAGNOSIS — F5101 Primary insomnia: Secondary | ICD-10-CM | POA: Diagnosis not present

## 2021-09-22 HISTORY — DX: Alcohol abuse, uncomplicated: F10.10

## 2021-09-23 ENCOUNTER — Telehealth (INDEPENDENT_AMBULATORY_CARE_PROVIDER_SITE_OTHER): Payer: Medicaid Other | Admitting: Infectious Disease

## 2021-09-23 ENCOUNTER — Encounter: Payer: Self-pay | Admitting: Infectious Disease

## 2021-09-23 ENCOUNTER — Other Ambulatory Visit: Payer: Self-pay

## 2021-09-23 ENCOUNTER — Other Ambulatory Visit (HOSPITAL_COMMUNITY): Payer: Self-pay

## 2021-09-23 DIAGNOSIS — Z7721 Contact with and (suspected) exposure to potentially hazardous body fluids: Secondary | ICD-10-CM

## 2021-09-23 DIAGNOSIS — R569 Unspecified convulsions: Secondary | ICD-10-CM

## 2021-09-23 DIAGNOSIS — I455 Other specified heart block: Secondary | ICD-10-CM | POA: Diagnosis not present

## 2021-09-23 DIAGNOSIS — A539 Syphilis, unspecified: Secondary | ICD-10-CM | POA: Diagnosis not present

## 2021-09-23 DIAGNOSIS — M4802 Spinal stenosis, cervical region: Secondary | ICD-10-CM

## 2021-09-23 DIAGNOSIS — Z113 Encounter for screening for infections with a predominantly sexual mode of transmission: Secondary | ICD-10-CM

## 2021-09-23 DIAGNOSIS — B20 Human immunodeficiency virus [HIV] disease: Secondary | ICD-10-CM | POA: Diagnosis not present

## 2021-09-23 DIAGNOSIS — W461XXA Contact with contaminated hypodermic needle, initial encounter: Secondary | ICD-10-CM

## 2021-09-23 DIAGNOSIS — Z599 Problem related to housing and economic circumstances, unspecified: Secondary | ICD-10-CM | POA: Diagnosis not present

## 2021-09-23 HISTORY — DX: Contact with contaminated hypodermic needle, initial encounter: W46.1XXA

## 2021-09-23 HISTORY — DX: Other specified heart block: I45.5

## 2021-09-23 MED ORDER — BIKTARVY 50-200-25 MG PO TABS
1.0000 | ORAL_TABLET | Freq: Every day | ORAL | 11 refills | Status: DC
  Filled 2021-09-23 – 2021-10-15 (×2): qty 30, 30d supply, fill #0
  Filled 2021-11-11: qty 30, 30d supply, fill #1
  Filled 2021-12-05: qty 30, 30d supply, fill #2
  Filled 2022-01-19: qty 30, 30d supply, fill #3
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
## 2021-09-24 ENCOUNTER — Other Ambulatory Visit: Payer: Self-pay

## 2021-09-24 ENCOUNTER — Other Ambulatory Visit: Payer: Medicaid Other

## 2021-09-24 ENCOUNTER — Other Ambulatory Visit (HOSPITAL_COMMUNITY)
Admission: RE | Admit: 2021-09-24 | Discharge: 2021-09-24 | Disposition: A | Discharge status: Home / Self Care | Payer: Medicaid Other | Source: Ambulatory Visit | Attending: Infectious Disease | Admitting: Infectious Disease

## 2021-09-24 DIAGNOSIS — B20 Human immunodeficiency virus [HIV] disease: Secondary | ICD-10-CM | POA: Diagnosis not present

## 2021-09-24 DIAGNOSIS — Z113 Encounter for screening for infections with a predominantly sexual mode of transmission: Secondary | ICD-10-CM | POA: Diagnosis not present

## 2021-09-25 DIAGNOSIS — I10 Essential (primary) hypertension: Secondary | ICD-10-CM | POA: Diagnosis not present

## 2021-09-25 DIAGNOSIS — B20 Human immunodeficiency virus [HIV] disease: Secondary | ICD-10-CM | POA: Diagnosis not present

## 2021-09-25 NOTE — Addendum Note (Signed)
Addended by: Marcell Anger on: 09/25/2021 10:36 AM ? ? Modules accepted: Orders ? ?

## 2021-09-26 LAB — CYTOLOGY, (ORAL, ANAL, URETHRAL) ANCILLARY ONLY
Chlamydia: NEGATIVE
Chlamydia: NEGATIVE
Comment: NEGATIVE
Comment: NEGATIVE
Comment: NORMAL
Comment: NORMAL
Neisseria Gonorrhea: NEGATIVE
Neisseria Gonorrhea: NEGATIVE

## 2021-09-26 LAB — URINE CYTOLOGY ANCILLARY ONLY
Chlamydia: NEGATIVE
Comment: NEGATIVE
Comment: NORMAL
Neisseria Gonorrhea: NEGATIVE

## 2021-09-29 ENCOUNTER — Telehealth: Payer: Self-pay

## 2021-09-29 ENCOUNTER — Other Ambulatory Visit: Payer: Self-pay | Admitting: Infectious Disease

## 2021-09-29 DIAGNOSIS — B20 Human immunodeficiency virus [HIV] disease: Secondary | ICD-10-CM | POA: Diagnosis not present

## 2021-09-29 DIAGNOSIS — A539 Syphilis, unspecified: Secondary | ICD-10-CM | POA: Diagnosis not present

## 2021-09-29 DIAGNOSIS — I10 Essential (primary) hypertension: Secondary | ICD-10-CM | POA: Diagnosis not present

## 2021-09-29 DIAGNOSIS — Z5329 Procedure and treatment not carried out because of patient's decision for other reasons: Secondary | ICD-10-CM | POA: Diagnosis not present

## 2021-09-29 DIAGNOSIS — G40219 Localization-related (focal) (partial) symptomatic epilepsy and epileptic syndromes with complex partial seizures, intractable, without status epilepticus: Secondary | ICD-10-CM | POA: Diagnosis not present

## 2021-09-29 LAB — RPR TITER: RPR Titer: 1:32 {titer} — ABNORMAL HIGH

## 2021-09-29 LAB — HIV-1 RNA QUANT-NO REFLEX-BLD
HIV 1 RNA Quant: <20 copies/mL — AB
HIV-1 RNA Quant, Log: <1.3 Log copies/mL — AB

## 2021-09-29 LAB — FLUORESCENT TREPONEMAL AB(FTA)-IGG-BLD: Fluorescent Treponemal ABS: REACTIVE — AB

## 2021-09-29 LAB — RPR: RPR Ser Ql: REACTIVE — AB

## 2021-09-29 MED ORDER — DOXYCYCLINE HYCLATE 100 MG PO TABS: ORAL_TABLET | ORAL | 2 refills | Status: DC

## 2021-09-29 NOTE — Telephone Encounter (Signed)
Spoke with patient and relayed positive syphilis results and need for treatment. Patient states he is doing a sleep study all week and will not be able to come in for treatment. He is also not wanting to receive 2 shots of penicillin and is asking if provider can just send in a prescription to his pharmacy. Discussed the need to refrain from sex until treatment is complete. Patient verbalized understanding and has no further questions.  ? ?Sandie Ano, RN ? ?

## 2021-09-29 NOTE — Telephone Encounter (Signed)
Spoke with patient and relayed that Dr. Daiva Eves has sent in doxycycline for him to take (14 days for treatment and additional pills to take after sex for prevention of syphilis/chlamydia). Patient wants to make sure that this will completely take care of the infection. Advised him that Dr. Daiva Eves will perform follow up lab work to make sure his titers have come down appropriately to indicate successful treatment.  ? ?Patient became upset when discussing a decrease in titer and states he does not just want it to decrease, but he wants it "completely gone." Attempted to explain serofast state and that antibody would likely remain reactive, but that this is not indicative of active infection, but rather a "memory" of previous infection. Attempted to assure patient that he will be treated adequately and that his provider will continue to track his response to treatment. He became silent and disconnected the call.  ? ?He called back wanting to discuss further, but again disconnected the call.  ? ?Sandie Ano, RN ? ?

## 2021-09-29 NOTE — Telephone Encounter (Signed)
-----   Message from Randall Hiss, MD sent at 09/28/2021  8:58 PM EDT ----- ?Needs 2.4 MU PCN x 1 and partners tested an d treated and he could use doxy for PEP after this too if he so wishes ?----- Message ----- ?From: Interface, [facility] Lab Results In ?Sent: 09/25/2021   4:08 PM EDT ?To: Randall Hiss, MD ? ? ?

## 2021-09-30 DIAGNOSIS — G40219 Localization-related (focal) (partial) symptomatic epilepsy and epileptic syndromes with complex partial seizures, intractable, without status epilepticus: Secondary | ICD-10-CM | POA: Diagnosis not present

## 2021-10-01 DIAGNOSIS — G40219 Localization-related (focal) (partial) symptomatic epilepsy and epileptic syndromes with complex partial seizures, intractable, without status epilepticus: Secondary | ICD-10-CM | POA: Diagnosis not present

## 2021-10-06 DIAGNOSIS — I10 Essential (primary) hypertension: Secondary | ICD-10-CM | POA: Diagnosis not present

## 2021-10-06 DIAGNOSIS — H6122 Impacted cerumen, left ear: Secondary | ICD-10-CM | POA: Diagnosis not present

## 2021-10-06 DIAGNOSIS — Z09 Encounter for follow-up examination after completed treatment for conditions other than malignant neoplasm: Secondary | ICD-10-CM | POA: Diagnosis not present

## 2021-10-13 ENCOUNTER — Other Ambulatory Visit (HOSPITAL_COMMUNITY): Payer: Self-pay

## 2021-10-13 DIAGNOSIS — B20 Human immunodeficiency virus [HIV] disease: Secondary | ICD-10-CM | POA: Diagnosis not present

## 2021-10-13 DIAGNOSIS — I10 Essential (primary) hypertension: Secondary | ICD-10-CM | POA: Diagnosis not present

## 2021-10-15 ENCOUNTER — Other Ambulatory Visit (HOSPITAL_COMMUNITY): Payer: Self-pay

## 2021-10-20 ENCOUNTER — Other Ambulatory Visit (HOSPITAL_COMMUNITY): Payer: Self-pay

## 2021-10-29 ENCOUNTER — Emergency Department (HOSPITAL_COMMUNITY)
Admission: EM | Admit: 2021-10-29 | Discharge: 2021-10-29 | Disposition: A | Discharge status: Home / Self Care | Payer: Medicaid Other | Attending: Emergency Medicine | Admitting: Emergency Medicine

## 2021-10-29 DIAGNOSIS — H9192 Unspecified hearing loss, left ear: Secondary | ICD-10-CM | POA: Diagnosis not present

## 2021-10-29 MED ORDER — CIPROFLOXACIN-DEXAMETHASONE 0.3-0.1 % OT SUSP: 4.0000 [drp] | Freq: Two times a day (BID) | OTIC | 0 refills | Status: AC

## 2021-10-29 NOTE — Discharge Instructions (Addendum)
Please apply antibiotic ear drops twice daily to left ear for 1 week. Call and follow up closely with ENT specialist for further care.  ?

## 2021-10-29 NOTE — ED Triage Notes (Signed)
Pt. Stated, had blood in my left ear last night and I cant hear out of it now ?

## 2021-10-29 NOTE — ED Provider Notes (Signed)
?[facility] EMERGENCY DEPARTMENT ?Provider Note ? ? ?CSN: [id number] ?Arrival date & time: 10/29/21  0931 ? ?  ? ?History ? ?Chief Complaint  ?Patient presents with  ? blood in ear  ? Hearing Loss  ? ? ?Perry Norton is a 46 y.o. male. ? ?The history is provided by the patient and medical records. No language interpreter was used.  ? ?46 year old male significant history of seizure presenting with complaints of hearing loss.  Patient report he has had trouble with both pain and decreased hearing to his left ear for the past several weeks.  States his PCP prescribed him some antibiotic eardrops in which she has been using with minimal improvement.  Last night he also noticed blood draining from his left ear with decreased hearing.  No fever.  Did had a seizure several days ago.  No runny nose sneezing coughing or sore throat ? ?Home Medications ?Prior to Admission medications   ?Medication Sig Start Date End Date Taking? Authorizing Provider  ?ciprofloxacin-dexamethasone (CIPRODEX) OTIC suspension Place 4 drops into the left ear 2 (two) times daily for 7 days. 10/29/21 11/05/21 Yes Fayrene Helper, PA-C  ?bictegravir-emtricitabine-tenofovir AF (BIKTARVY) 50-200-25 MG TABS tablet Take 1 tablet by mouth daily. 09/23/21   Randall Hiss, MD  ?doxycycline (VIBRA-TABS) 100 MG tablet Take 1 tablet twice daily for 14 days then take 2 tablets after unprotected sex to prevent STI 09/29/21   Daiva Eves, Lisette Grinder, MD  ?lacosamide (VIMPAT) 200 MG TABS tablet Take 1 tablet (200 mg total) by mouth 2 (two) times daily. ?Patient not taking: Reported on 06/03/2021 07/21/17   Van Clines, MD  ?levETIRAcetam (KEPPRA) 500 MG tablet Take 1 tablet (500 mg total) by mouth 2 (two) times daily. ?Patient not taking: Reported on 07/28/2021 06/03/21   Dartha Lodge, PA-C  ?Vitamin D, Ergocalciferol, (DRISDOL) 1.25 MG (50000 UNIT) CAPS capsule Take 50,000 Units by mouth See admin instructions. Takes 3 times a week , Monday,  Wednesday and Friday ?Patient not taking: Reported on 07/28/2021    [provider]  ?   ? ?Allergies    ?Onion   ? ?Review of Systems   ?Review of Systems  ?All other systems reviewed and are negative. ? ?Physical Exam ?Updated Vital Signs ?BP (!) 112/93 (BP Location: Right Arm)   Pulse 87   Temp 97.8 ?F (36.6 ?C) (Oral)   Resp 14   Ht 6' (1.829 m)   Wt 78 kg   SpO2 100%   BMI 23.33 kg/m?  ?Physical Exam ?Vitals and nursing note reviewed.  ?Constitutional:   ?   General: He is not in acute distress. ?   Appearance: He is well-developed.  ?HENT:  ?   Head: Normocephalic and atraumatic.  ?   Ears:  ?   Comments: Left ear: Unable to visualize TM, unsure if patient has TM perforation but there are evidence of cerumen and mild ear canal erythema.  Tenderness to palpation of the earlobe. ?Right ear: Normal TM, nontender to palpation. ? ?Decreased hearing to left ear compared to right ?Eyes:  ?   Conjunctiva/sclera: Conjunctivae normal.  ?Musculoskeletal:  ?   Cervical back: Neck supple.  ?Skin: ?   Findings: No rash.  ?Neurological:  ?   Mental Status: He is alert.  ? ? ?ED Results / Procedures / Treatments   ?Labs ?(all labs ordered are listed, but only abnormal results are displayed) ?Labs Reviewed - No data to display ? ?EKG ?None ? ?  Radiology ?No results found. ? ?Procedures ?Procedures  ? ? ?Medications Ordered in ED ?Medications - No data to display ? ?ED Course/ Medical Decision Making/ A&P ?  ?                        ?Medical Decision Making ?Risk ?Prescription drug management. ? ? ?BP (!) 112/93 (BP Location: Right Arm)   Pulse 87   Temp 97.8 ?F (36.6 ?C) (Oral)   Resp 14   Ht 6' (1.829 m)   Wt 78 kg   SpO2 100%   BMI 23.33 kg/m?  ? ?11:11 AM ?Patient report progressive decreased hearing and pain to his left ear ongoing for the past several weeks worse since last night with evidence of blood coming out from his left ear.  On exam no blood noted, unable to visualize left TM and unsure if  patient has a total TM perforation versus some cerumen impaction.  His ear is tender to palpation therefore will prescribe Ciprodex eardrops but encourage patient to call and follow-up closely with ENT for further evaluation.  Patient mention having seizures several days prior and a fall.  However, finding not consistent with traumatic head injury or barotrauma. ? ? ? ? ? ? ? ?Final Clinical Impression(s) / ED Diagnoses ?Final diagnoses:  ?Hearing loss of left ear, unspecified hearing loss type  ? ? ?Rx / DC Orders ?ED Discharge Orders   ? ?      Ordered  ?  ciprofloxacin-dexamethasone (CIPRODEX) OTIC suspension  2 times daily       ? 10/29/21 1107  ? ?  ?  ? ?  ? ? ?  ?Fayrene Helper, PA-C ?10/29/21 1112 ? ?  ?Tegeler, Canary Brim, MD ?10/29/21 1250 ? ?

## 2021-10-29 NOTE — ED Notes (Signed)
Pt. Stated, I fall due to seizures . Last seizure was Sunday night. Sees  ?Dr. On 24 for the problem ?

## 2021-11-10 DIAGNOSIS — M5412 Radiculopathy, cervical region: Secondary | ICD-10-CM | POA: Diagnosis not present

## 2021-11-10 DIAGNOSIS — Z09 Encounter for follow-up examination after completed treatment for conditions other than malignant neoplasm: Secondary | ICD-10-CM | POA: Diagnosis not present

## 2021-11-10 DIAGNOSIS — G9520 Unspecified cord compression: Secondary | ICD-10-CM | POA: Diagnosis not present

## 2021-11-11 ENCOUNTER — Other Ambulatory Visit (HOSPITAL_COMMUNITY): Payer: Self-pay

## 2021-11-12 DIAGNOSIS — G40219 Localization-related (focal) (partial) symptomatic epilepsy and epileptic syndromes with complex partial seizures, intractable, without status epilepticus: Secondary | ICD-10-CM | POA: Diagnosis not present

## 2021-11-13 ENCOUNTER — Other Ambulatory Visit (HOSPITAL_COMMUNITY): Payer: Self-pay

## 2021-12-05 ENCOUNTER — Other Ambulatory Visit (HOSPITAL_COMMUNITY): Payer: Self-pay

## 2021-12-08 DIAGNOSIS — Z130 Encounter for screening for diseases of the blood and blood-forming organs and certain disorders involving the immune mechanism: Secondary | ICD-10-CM | POA: Diagnosis not present

## 2021-12-08 DIAGNOSIS — Z0000 Encounter for general adult medical examination without abnormal findings: Secondary | ICD-10-CM | POA: Diagnosis not present

## 2021-12-08 DIAGNOSIS — R61 Generalized hyperhidrosis: Secondary | ICD-10-CM | POA: Diagnosis not present

## 2021-12-08 DIAGNOSIS — G40909 Epilepsy, unspecified, not intractable, without status epilepticus: Secondary | ICD-10-CM | POA: Diagnosis not present

## 2021-12-08 DIAGNOSIS — B20 Human immunodeficiency virus [HIV] disease: Secondary | ICD-10-CM | POA: Diagnosis not present

## 2021-12-08 DIAGNOSIS — I10 Essential (primary) hypertension: Secondary | ICD-10-CM | POA: Diagnosis not present

## 2021-12-08 DIAGNOSIS — Z13220 Encounter for screening for lipoid disorders: Secondary | ICD-10-CM | POA: Diagnosis not present

## 2021-12-08 DIAGNOSIS — F4323 Adjustment disorder with mixed anxiety and depressed mood: Secondary | ICD-10-CM | POA: Diagnosis not present

## 2021-12-08 DIAGNOSIS — Z1329 Encounter for screening for other suspected endocrine disorder: Secondary | ICD-10-CM | POA: Diagnosis not present

## 2021-12-08 DIAGNOSIS — M5412 Radiculopathy, cervical region: Secondary | ICD-10-CM | POA: Diagnosis not present

## 2021-12-17 ENCOUNTER — Other Ambulatory Visit (HOSPITAL_COMMUNITY): Payer: Self-pay

## 2021-12-17 DIAGNOSIS — G40219 Localization-related (focal) (partial) symptomatic epilepsy and epileptic syndromes with complex partial seizures, intractable, without status epilepticus: Secondary | ICD-10-CM | POA: Diagnosis not present

## 2021-12-24 DIAGNOSIS — G40219 Localization-related (focal) (partial) symptomatic epilepsy and epileptic syndromes with complex partial seizures, intractable, without status epilepticus: Secondary | ICD-10-CM | POA: Diagnosis not present

## 2021-12-24 DIAGNOSIS — G40909 Epilepsy, unspecified, not intractable, without status epilepticus: Secondary | ICD-10-CM | POA: Diagnosis not present

## 2021-12-24 DIAGNOSIS — Z01818 Encounter for other preprocedural examination: Secondary | ICD-10-CM | POA: Diagnosis not present

## 2022-01-06 ENCOUNTER — Other Ambulatory Visit (HOSPITAL_COMMUNITY): Payer: Self-pay

## 2022-01-08 ENCOUNTER — Other Ambulatory Visit (HOSPITAL_COMMUNITY): Payer: Self-pay

## 2022-01-09 DIAGNOSIS — Z981 Arthrodesis status: Secondary | ICD-10-CM | POA: Diagnosis not present

## 2022-01-09 DIAGNOSIS — Z4789 Encounter for other orthopedic aftercare: Secondary | ICD-10-CM | POA: Diagnosis not present

## 2022-01-15 ENCOUNTER — Ambulatory Visit: Payer: Medicaid Other | Admitting: Podiatry

## 2022-01-19 ENCOUNTER — Other Ambulatory Visit (HOSPITAL_COMMUNITY): Payer: Self-pay

## 2022-01-20 ENCOUNTER — Other Ambulatory Visit (HOSPITAL_COMMUNITY): Payer: Self-pay

## 2022-01-26 ENCOUNTER — Ambulatory Visit: Payer: Medicaid Other | Admitting: Infectious Disease

## 2022-01-29 ENCOUNTER — Ambulatory Visit (INDEPENDENT_AMBULATORY_CARE_PROVIDER_SITE_OTHER): Payer: Medicaid Other | Admitting: Podiatry

## 2022-01-29 ENCOUNTER — Ambulatory Visit (INDEPENDENT_AMBULATORY_CARE_PROVIDER_SITE_OTHER): Payer: Medicaid Other

## 2022-01-29 DIAGNOSIS — M722 Plantar fascial fibromatosis: Secondary | ICD-10-CM

## 2022-01-29 DIAGNOSIS — M216X2 Other acquired deformities of left foot: Secondary | ICD-10-CM

## 2022-01-29 DIAGNOSIS — M216X1 Other acquired deformities of right foot: Secondary | ICD-10-CM
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
## 2022-02-02 DIAGNOSIS — M4802 Spinal stenosis, cervical region: Secondary | ICD-10-CM | POA: Diagnosis not present

## 2022-02-02 DIAGNOSIS — G9520 Unspecified cord compression: Secondary | ICD-10-CM | POA: Diagnosis not present

## 2022-02-02 DIAGNOSIS — M5412 Radiculopathy, cervical region: Secondary | ICD-10-CM | POA: Diagnosis not present

## 2022-02-09 ENCOUNTER — Other Ambulatory Visit (HOSPITAL_COMMUNITY): Payer: Self-pay

## 2022-02-11 ENCOUNTER — Other Ambulatory Visit (HOSPITAL_COMMUNITY): Payer: Self-pay

## 2022-02-11 ENCOUNTER — Ambulatory Visit (INDEPENDENT_AMBULATORY_CARE_PROVIDER_SITE_OTHER): Payer: Medicaid Other | Admitting: Infectious Disease

## 2022-02-11 ENCOUNTER — Other Ambulatory Visit (HOSPITAL_COMMUNITY)
Admission: RE | Admit: 2022-02-11 | Discharge: 2022-02-11 | Disposition: A | Discharge status: Home / Self Care | Payer: Medicaid Other | Source: Ambulatory Visit | Attending: Infectious Disease | Admitting: Infectious Disease

## 2022-02-11 ENCOUNTER — Encounter: Payer: Self-pay | Admitting: Infectious Disease

## 2022-02-11 ENCOUNTER — Other Ambulatory Visit: Payer: Self-pay

## 2022-02-11 VITALS — BP 135/92 | HR 103 | Temp 97.4°F | Ht 72.0 in | Wt 172.0 lb

## 2022-02-11 DIAGNOSIS — B20 Human immunodeficiency virus [HIV] disease: Secondary | ICD-10-CM | POA: Insufficient documentation

## 2022-02-11 DIAGNOSIS — I455 Other specified heart block: Secondary | ICD-10-CM | POA: Diagnosis not present

## 2022-02-11 DIAGNOSIS — F1290 Cannabis use, unspecified, uncomplicated: Secondary | ICD-10-CM | POA: Diagnosis not present

## 2022-02-11 DIAGNOSIS — M4802 Spinal stenosis, cervical region: Secondary | ICD-10-CM

## 2022-02-11 DIAGNOSIS — Z113 Encounter for screening for infections with a predominantly sexual mode of transmission: Secondary | ICD-10-CM

## 2022-02-11 MED ORDER — BIKTARVY 50-200-25 MG PO TABS
1.0000 | ORAL_TABLET | Freq: Every day | ORAL | 11 refills | Status: DC
  Filled 2022-02-11 – 2022-02-13 (×2): qty 30, 30d supply, fill #0
  Filled 2022-03-24: qty 30, 30d supply, fill #1

## 2022-02-11 MED ORDER — ROSUVASTATIN CALCIUM 10 MG PO TABS
10.0000 mg | ORAL_TABLET | Freq: Every day | ORAL | 11 refills | Status: DC
Start: 2022-02-11 — End: 2022-04-08
  Filled 2022-02-11: qty 30, 30d supply, fill #0
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
## 2022-02-12 DIAGNOSIS — G40219 Localization-related (focal) (partial) symptomatic epilepsy and epileptic syndromes with complex partial seizures, intractable, without status epilepticus: Secondary | ICD-10-CM | POA: Diagnosis not present

## 2022-02-12 LAB — URINE CYTOLOGY ANCILLARY ONLY
Chlamydia: NEGATIVE
Comment: NEGATIVE
Comment: NORMAL
Neisseria Gonorrhea: NEGATIVE

## 2022-02-12 LAB — CYTOLOGY, (ORAL, ANAL, URETHRAL) ANCILLARY ONLY
Chlamydia: NEGATIVE
Comment: NEGATIVE
Comment: NORMAL
Neisseria Gonorrhea: NEGATIVE

## 2022-02-12 LAB — T-HELPER CELLS (CD4) COUNT (NOT AT ARMC)
CD4 % Helper T Cell: 36 % (ref 33–65)
CD4 T Cell Abs: 653 /uL (ref 400–1790)

## 2022-02-13 ENCOUNTER — Other Ambulatory Visit (HOSPITAL_COMMUNITY): Payer: Self-pay

## 2022-02-14 LAB — COMPLETE METABOLIC PANEL WITH GFR
AG Ratio: 2 (calc) (ref 1.0–2.5)
ALT: 15 U/L (ref 9–46)
AST: 20 U/L (ref 10–40)
Albumin: 4.9 g/dL (ref 3.6–5.1)
Alkaline phosphatase (APISO): 79 U/L (ref 36–130)
BUN/Creatinine Ratio: 8 (calc) (ref 6–22)
BUN: 12 mg/dL (ref 7–25)
CO2: 27 mmol/L (ref 20–32)
Calcium: 9.4 mg/dL (ref 8.6–10.3)
Chloride: 103 mmol/L (ref 98–110)
Creat: 1.48 mg/dL — ABNORMAL HIGH (ref 0.60–1.29)
Globulin: 2.4 g/dL (calc) (ref 1.9–3.7)
Glucose, Bld: 104 mg/dL — ABNORMAL HIGH (ref 65–99)
Potassium: 3.7 mmol/L (ref 3.5–5.3)
Sodium: 140 mmol/L (ref 135–146)
Total Bilirubin: 0.4 mg/dL (ref 0.2–1.2)
Total Protein: 7.3 g/dL (ref 6.1–8.1)
eGFR: 59 mL/min/{1.73_m2} — ABNORMAL LOW (ref >=60)

## 2022-02-14 LAB — CBC WITH DIFFERENTIAL/PLATELET
Absolute Monocytes: 412 cells/uL (ref 200–950)
Basophils Absolute: 41 cells/uL (ref 0–200)
Basophils Relative: 0.7 %
Eosinophils Absolute: 70 cells/uL (ref 15–500)
Eosinophils Relative: 1.2 %
HCT: 42 % (ref 38.5–50.0)
Hemoglobin: 14.3 g/dL (ref 13.2–17.1)
Lymphs Abs: 1868 cells/uL (ref 850–3900)
MCH: 30.1 pg (ref 27.0–33.0)
MCHC: 34 g/dL (ref 32.0–36.0)
MCV: 88.4 fL (ref 80.0–100.0)
MPV: 10.9 fL (ref 7.5–12.5)
Monocytes Relative: 7.1 %
Neutro Abs: 3410 cells/uL (ref 1500–7800)
Neutrophils Relative %: 58.8 %
Platelets: 207 10*3/uL (ref 140–400)
RBC: 4.75 10*6/uL (ref 4.20–5.80)
RDW: 13.3 % (ref 11.0–15.0)
Total Lymphocyte: 32.2 %
WBC: 5.8 10*3/uL (ref 3.8–10.8)

## 2022-02-14 LAB — LIPID PANEL
Cholesterol: 189 mg/dL (ref <200)
HDL: 44 mg/dL (ref >=40)
LDL Cholesterol (Calc): 123 mg/dL (calc) — ABNORMAL HIGH
Non-HDL Cholesterol (Calc): 145 mg/dL (calc) — ABNORMAL HIGH (ref <130)
Total CHOL/HDL Ratio: 4.3 (calc) (ref <5.0)
Triglycerides: 116 mg/dL (ref <150)

## 2022-02-14 LAB — HIV-1 RNA QUANT-NO REFLEX-BLD
HIV 1 RNA Quant: <20 Copies/mL — ABNORMAL HIGH
HIV-1 RNA Quant, Log: <1.3 Log cps/mL — ABNORMAL HIGH

## 2022-02-14 LAB — RPR TITER: RPR Titer: 1:16 {titer} — ABNORMAL HIGH

## 2022-02-14 LAB — FLUORESCENT TREPONEMAL AB(FTA)-IGG-BLD: Fluorescent Treponemal ABS: REACTIVE — AB

## 2022-02-14 LAB — RPR: RPR Ser Ql: REACTIVE — AB

## 2022-02-16 ENCOUNTER — Other Ambulatory Visit (HOSPITAL_COMMUNITY): Payer: Self-pay

## 2022-02-16 ENCOUNTER — Telehealth: Payer: Self-pay

## 2022-02-16 NOTE — Telephone Encounter (Signed)
Patient called requesting lab results. Patient advised his VL is undetectable, STI testing was negative and he RPR titer is decreasing as well which is a good sign. Patient still requested to speak with Dr. Daiva Eves, but would not state what he wanted to discuss. I offered patient your next available appointment and he refused to do a phone visit to discuss what he needs.  Kearstyn Avitia T Pricilla Loveless

## 2022-03-03 DIAGNOSIS — G40219 Localization-related (focal) (partial) symptomatic epilepsy and epileptic syndromes with complex partial seizures, intractable, without status epilepticus: Secondary | ICD-10-CM | POA: Diagnosis not present

## 2022-03-11 ENCOUNTER — Other Ambulatory Visit (HOSPITAL_COMMUNITY): Payer: Self-pay

## 2022-03-13 ENCOUNTER — Other Ambulatory Visit (HOSPITAL_COMMUNITY): Payer: Self-pay

## 2022-03-16 ENCOUNTER — Other Ambulatory Visit (HOSPITAL_COMMUNITY): Payer: Self-pay

## 2022-03-16 DIAGNOSIS — M5412 Radiculopathy, cervical region: Secondary | ICD-10-CM | POA: Diagnosis not present

## 2022-03-16 DIAGNOSIS — M47812 Spondylosis without myelopathy or radiculopathy, cervical region: Secondary | ICD-10-CM | POA: Diagnosis not present

## 2022-03-18 DIAGNOSIS — Z4789 Encounter for other orthopedic aftercare: Secondary | ICD-10-CM | POA: Diagnosis not present

## 2022-03-24 ENCOUNTER — Other Ambulatory Visit (HOSPITAL_COMMUNITY): Payer: Self-pay

## 2022-03-25 ENCOUNTER — Other Ambulatory Visit (HOSPITAL_COMMUNITY): Payer: Self-pay

## 2022-04-06 ENCOUNTER — Inpatient Hospital Stay (HOSPITAL_COMMUNITY)
Admission: EM | Admit: 2022-04-06 | Discharge: 2022-04-08 | DRG: 101 | Disposition: A | Discharge status: Home / Self Care | Payer: Medicaid Other | Attending: Internal Medicine | Admitting: Internal Medicine

## 2022-04-06 ENCOUNTER — Emergency Department (HOSPITAL_COMMUNITY): Payer: Medicaid Other

## 2022-04-06 ENCOUNTER — Encounter (HOSPITAL_COMMUNITY): Payer: Self-pay

## 2022-04-06 ENCOUNTER — Other Ambulatory Visit: Payer: Self-pay

## 2022-04-06 DIAGNOSIS — Z91018 Allergy to other foods: Secondary | ICD-10-CM | POA: Diagnosis not present

## 2022-04-06 DIAGNOSIS — E559 Vitamin D deficiency, unspecified: Secondary | ICD-10-CM | POA: Diagnosis present

## 2022-04-06 DIAGNOSIS — Z91148 Patient's other noncompliance with medication regimen for other reason: Secondary | ICD-10-CM | POA: Diagnosis not present

## 2022-04-06 DIAGNOSIS — M5412 Radiculopathy, cervical region: Secondary | ICD-10-CM | POA: Diagnosis not present

## 2022-04-06 DIAGNOSIS — R9431 Abnormal electrocardiogram [ECG] [EKG]: Secondary | ICD-10-CM | POA: Diagnosis not present

## 2022-04-06 DIAGNOSIS — G4489 Other headache syndrome: Secondary | ICD-10-CM | POA: Diagnosis not present

## 2022-04-06 DIAGNOSIS — R569 Unspecified convulsions: Principal | ICD-10-CM

## 2022-04-06 DIAGNOSIS — R109 Unspecified abdominal pain: Secondary | ICD-10-CM

## 2022-04-06 DIAGNOSIS — R410 Disorientation, unspecified: Secondary | ICD-10-CM | POA: Diagnosis not present

## 2022-04-06 DIAGNOSIS — R079 Chest pain, unspecified: Secondary | ICD-10-CM | POA: Diagnosis not present

## 2022-04-06 DIAGNOSIS — G40909 Epilepsy, unspecified, not intractable, without status epilepticus: Secondary | ICD-10-CM | POA: Diagnosis not present

## 2022-04-06 DIAGNOSIS — M6282 Rhabdomyolysis: Secondary | ICD-10-CM | POA: Diagnosis present

## 2022-04-06 DIAGNOSIS — R0789 Other chest pain: Secondary | ICD-10-CM | POA: Diagnosis not present

## 2022-04-06 DIAGNOSIS — N1831 Chronic kidney disease, stage 3a: Secondary | ICD-10-CM | POA: Diagnosis present

## 2022-04-06 DIAGNOSIS — Z79899 Other long term (current) drug therapy: Secondary | ICD-10-CM | POA: Diagnosis not present

## 2022-04-06 DIAGNOSIS — I129 Hypertensive chronic kidney disease with stage 1 through stage 4 chronic kidney disease, or unspecified chronic kidney disease: Secondary | ICD-10-CM | POA: Diagnosis present

## 2022-04-06 DIAGNOSIS — F17210 Nicotine dependence, cigarettes, uncomplicated: Secondary | ICD-10-CM | POA: Diagnosis not present

## 2022-04-06 DIAGNOSIS — G40219 Localization-related (focal) (partial) symptomatic epilepsy and epileptic syndromes with complex partial seizures, intractable, without status epilepticus: Principal | ICD-10-CM | POA: Diagnosis present

## 2022-04-06 DIAGNOSIS — N179 Acute kidney failure, unspecified: Secondary | ICD-10-CM | POA: Diagnosis present

## 2022-04-06 DIAGNOSIS — B20 Human immunodeficiency virus [HIV] disease: Secondary | ICD-10-CM

## 2022-04-06 LAB — COMPREHENSIVE METABOLIC PANEL
ALT: 26 U/L (ref 0–44)
AST: 55 U/L — ABNORMAL HIGH (ref 15–41)
Albumin: 4.2 g/dL (ref 3.5–5.0)
Alkaline Phosphatase: 66 U/L (ref 38–126)
Anion gap: 10 (ref 5–15)
BUN: 14 mg/dL (ref 6–20)
CO2: 25 mmol/L (ref 22–32)
Calcium: 8.3 mg/dL — ABNORMAL LOW (ref 8.9–10.3)
Chloride: 103 mmol/L (ref 98–111)
Creatinine, Ser: 1.66 mg/dL — ABNORMAL HIGH (ref 0.61–1.24)
GFR, Estimated: 52 mL/min — ABNORMAL LOW (ref >60)
Glucose, Bld: 105 mg/dL — ABNORMAL HIGH (ref 70–99)
Potassium: 3.6 mmol/L (ref 3.5–5.1)
Sodium: 138 mmol/L (ref 135–145)
Total Bilirubin: 0.6 mg/dL (ref 0.3–1.2)
Total Protein: 7.3 g/dL (ref 6.5–8.1)

## 2022-04-06 LAB — CBC WITH DIFFERENTIAL/PLATELET
Abs Immature Granulocytes: 0.01 10*3/uL (ref 0.00–0.07)
Basophils Absolute: 0 10*3/uL (ref 0.0–0.1)
Basophils Relative: 1 %
Eosinophils Absolute: 0 10*3/uL (ref 0.0–0.5)
Eosinophils Relative: 0 %
HCT: 47.6 % (ref 39.0–52.0)
Hemoglobin: 15.8 g/dL (ref 13.0–17.0)
Immature Granulocytes: 0 %
Lymphocytes Relative: 34 %
Lymphs Abs: 1.1 10*3/uL (ref 0.7–4.0)
MCH: 29.7 pg (ref 26.0–34.0)
MCHC: 33.2 g/dL (ref 30.0–36.0)
MCV: 89.5 fL (ref 80.0–100.0)
Monocytes Absolute: 0.5 10*3/uL (ref 0.1–1.0)
Monocytes Relative: 16 %
Neutro Abs: 1.6 10*3/uL — ABNORMAL LOW (ref 1.7–7.7)
Neutrophils Relative %: 49 %
Platelets: 161 10*3/uL (ref 150–400)
RBC: 5.32 MIL/uL (ref 4.22–5.81)
RDW: 13.2 % (ref 11.5–15.5)
WBC: 3.3 10*3/uL — ABNORMAL LOW (ref 4.0–10.5)
nRBC: 0 % (ref 0.0–0.2)

## 2022-04-06 LAB — I-STAT CHEM 8, ED
BUN: 15 mg/dL (ref 6–20)
Calcium, Ion: 1.03 mmol/L — ABNORMAL LOW (ref 1.15–1.40)
Chloride: 101 mmol/L (ref 98–111)
Creatinine, Ser: 1.5 mg/dL — ABNORMAL HIGH (ref 0.61–1.24)
Glucose, Bld: 98 mg/dL (ref 70–99)
HCT: 46 % (ref 39.0–52.0)
Hemoglobin: 15.6 g/dL (ref 13.0–17.0)
Potassium: 4 mmol/L (ref 3.5–5.1)
Sodium: 140 mmol/L (ref 135–145)
TCO2: 28 mmol/L (ref 22–32)

## 2022-04-06 LAB — LIPASE, BLOOD: Lipase: 27 U/L (ref 11–51)

## 2022-04-06 LAB — MAGNESIUM: Magnesium: 2.2 mg/dL (ref 1.7–2.4)

## 2022-04-06 MED ORDER — IOHEXOL 350 MG/ML SOLN
75.0000 mL | Freq: Once | INTRAVENOUS | Status: AC | PRN
  Administered 2022-04-06: 75 mL via INTRAVENOUS

## 2022-04-06 MED ORDER — ZONISAMIDE 100 MG PO CAPS
300.0000 mg | ORAL_CAPSULE | Freq: Once | ORAL | Status: DC
  Filled 2022-04-06: qty 3

## 2022-04-06 MED ORDER — LORAZEPAM 2 MG/ML IJ SOLN
2.0000 mg | Freq: Once | INTRAMUSCULAR | Status: AC
  Administered 2022-04-06: 2 mg via INTRAVENOUS
  Filled 2022-04-06: qty 1

## 2022-04-06 MED ORDER — LACTATED RINGERS IV BOLUS
1000.0000 mL | Freq: Once | INTRAVENOUS | Status: AC
  Administered 2022-04-06: 1000 mL via INTRAVENOUS

## 2022-04-06 MED ORDER — HALOPERIDOL LACTATE 5 MG/ML IJ SOLN
5.0000 mg | Freq: Once | INTRAMUSCULAR | Status: DC
  Filled 2022-04-06: qty 1

## 2022-04-06 MED ORDER — SODIUM CHLORIDE 0.9 % IV BOLUS
1000.0000 mL | Freq: Once | INTRAVENOUS | Status: AC
  Administered 2022-04-06: 1000 mL via INTRAVENOUS

## 2022-04-06 MED ORDER — SODIUM CHLORIDE 0.9 % IV SOLN
1500.0000 mg | Freq: Once | INTRAVENOUS | Status: AC
  Administered 2022-04-06: 1500 mg via INTRAVENOUS
  Filled 2022-04-06: qty 30

## 2022-04-06 MED ORDER — SODIUM CHLORIDE 0.9 % IV SOLN
2000.0000 mg | Freq: Once | INTRAVENOUS | Status: AC
  Administered 2022-04-06: 2000 mg via INTRAVENOUS
  Filled 2022-04-06: qty 20

## 2022-04-06 MED ORDER — LEVETIRACETAM IN NACL 1000 MG/100ML IV SOLN
1000.0000 mg | Freq: Once | INTRAVENOUS | Status: AC
  Administered 2022-04-06: 1000 mg via INTRAVENOUS
  Filled 2022-04-06: qty 100

## 2022-04-06 MED ORDER — MIDAZOLAM HCL 2 MG/2ML IJ SOLN
2.0000 mg | Freq: Once | INTRAMUSCULAR | Status: AC
  Administered 2022-04-06: 2 mg via INTRAVENOUS
  Filled 2022-04-06: qty 2

## 2022-04-06 MED ORDER — SODIUM CHLORIDE 0.9 % IV SOLN: 100.0000 mg | Freq: Three times a day (TID) | INTRAVENOUS | Status: DC

## 2022-04-06 MED ORDER — LORAZEPAM 2 MG/ML IJ SOLN
2.0000 mg | Freq: Once | INTRAMUSCULAR | Status: AC
  Administered 2022-04-06: 1 mg via INTRAVENOUS
  Filled 2022-04-06: qty 1

## 2022-04-06 MED ORDER — HALOPERIDOL LACTATE 5 MG/ML IJ SOLN
5.0000 mg | Freq: Once | INTRAMUSCULAR | Status: AC
  Administered 2022-04-06: 5 mg via INTRAMUSCULAR

## 2022-04-06 NOTE — ED Notes (Signed)
Pt awake and alert sitting straight up in bed. Pt states call my wife and tell her to come get me now. Advised patient of plan for blood work, urine and CT of abd. Pt states he has had blood in urine for 2 days and abd pain for several days. Pt denies missing any doses of seizure medication. Pt states he doesn't want to stay but did allow this nurse to hand LR bolus. EDP made aware that pt doesn't want to be here and will come speak to pt.

## 2022-04-06 NOTE — ED Triage Notes (Signed)
Pt to er room number 13 via ems, per ems pt has been has a hx of seizures, states that he is here today because he had some seizures yesterday and today.  States that the placed an iv in his L ac, 5mg  of versed IM.

## 2022-04-06 NOTE — ED Provider Notes (Signed)
[facility] DEPT Provider Note   CSN: [id number] Arrival date & time: 04/06/22  1605     History  Chief Complaint  Patient presents with   Seizures    Perry Norton is a 46 y.o. male.  46 yo M with a chief complaints of multiple seizures over the past couple days.  Patient initially not wanting to discuss much of his case with me.  He did intervene that he thinks he had 10 seizures in the past couple days.  Denies fevers.  States compliance with his home antiepileptic medications.   Seizures      Home Medications Prior to Admission medications   Medication Sig Start Date End Date Taking? Authorizing Provider  amLODipine (NORVASC) 5 MG tablet Take 5 mg by mouth daily. 09/25/21  Yes [provider]  bictegravir-emtricitabine-tenofovir AF (BIKTARVY) 50-200-25 MG TABS tablet Take 1 tablet by mouth daily. Patient taking differently: Take 1 tablet by mouth at bedtime. 02/11/22  Yes [name], Lavell Islam, MD  levETIRAcetam (KEPPRA) 750 MG tablet Take 1,500 mg by mouth 2 (two) times daily. 01/16/22  Yes [provider]  zonisamide (ZONEGRAN) 100 MG capsule Take 300 mg by mouth at bedtime. 11/12/21  Yes [provider]  lacosamide (VIMPAT) 200 MG TABS tablet Take 1 tablet (200 mg total) by mouth 2 (two) times daily. Patient not taking: Reported on 06/03/2021 07/21/17   Cameron Sprang, MD  rosuvastatin (CRESTOR) 10 MG tablet Take 1 tablet (10 mg total) by mouth daily. Patient not taking: Reported on 04/06/2022 02/11/22   [name], Lavell Islam, MD      Allergies    Onion    Review of Systems   Review of Systems  Neurological:  Positive for seizures.    Physical Exam Updated Vital Signs BP 120/75 (BP Location: Right Arm)   Pulse 96   Temp 98.5 F (36.9 C) (Oral)   Resp 13   Ht 6' (1.829 m)   Wt 83.9 kg   SpO2 100%   BMI 25.09 kg/m  Physical Exam Vitals and nursing note reviewed.  Constitutional:      Appearance: He is  well-developed.  HENT:     Head: Normocephalic and atraumatic.  Eyes:     Pupils: Pupils are equal, round, and reactive to light.  Neck:     Vascular: No JVD.  Cardiovascular:     Rate and Rhythm: Normal rate and regular rhythm.     Heart sounds: No murmur heard.    No friction rub. No gallop.  Pulmonary:     Effort: No respiratory distress.     Breath sounds: No wheezing.  Abdominal:     General: There is no distension.     Tenderness: There is no abdominal tenderness. There is no guarding or rebound.  Musculoskeletal:        General: Normal range of motion.     Cervical back: Normal range of motion and neck supple.  Skin:    Coloration: Skin is not pale.     Findings: No rash.  Neurological:     Mental Status: He is alert.     Comments: A bit sleepy on exam.  Was able to tell me that he had 6 seizures yesterday and 4 today.  Psychiatric:        Behavior: Behavior normal.     ED Results / Procedures / Treatments   Labs (all labs ordered are listed, but only abnormal results are displayed) Labs Reviewed  CBC WITH DIFFERENTIAL/PLATELET - Abnormal; Notable for the following components:      Result Value   WBC 3.3 (*)    Neutro Abs 1.6 (*)    All other components within normal limits  COMPREHENSIVE METABOLIC PANEL - Abnormal; Notable for the following components:   Glucose, Bld 105 (*)    Creatinine, Ser 1.66 (*)    Calcium 8.3 (*)    AST 55 (*)    GFR, Estimated 52 (*)    All other components within normal limits  I-STAT CHEM 8, ED - Abnormal; Notable for the following components:   Creatinine, Ser 1.50 (*)    Calcium, Ion 1.03 (*)    All other components within normal limits  MAGNESIUM  LACOSAMIDE  LEVETIRACETAM LEVEL    EKG None  Radiology CT Head Wo Contrast  Result Date: 04/06/2022 CLINICAL DATA:  Possible seizure. EXAM: CT HEAD WITHOUT CONTRAST TECHNIQUE: Contiguous axial images were obtained from the base of the skull through the vertex without  intravenous contrast. RADIATION DOSE REDUCTION: This exam was performed according to the departmental dose-optimization program which includes automated exposure control, adjustment of the mA and/or kV according to patient size and/or use of iterative reconstruction technique. COMPARISON:  06/03/2021 FINDINGS: Brain: Normal appearing cerebral hemispheres and posterior fossa structures. Normal size and position of the ventricles. No intracranial hemorrhage, mass lesion or CT evidence of acute infarction. Vascular: No hyperdense vessel or unexpected calcification. Skull: Normal. Negative for fracture or focal lesion. Sinuses/Orbits: Unremarkable. Other: None. IMPRESSION: Normal examination. Electronically Signed   By: Beckie Salts M.D.   On: 04/06/2022 19:15    Procedures Procedures    Medications Ordered in ED Medications  zonisamide (ZONEGRAN) capsule 300 mg (has no administration in time range)  levETIRAcetam (KEPPRA) 2,000 mg in sodium chloride 0.9 % 250 mL IVPB (has no administration in time range)  sodium chloride 0.9 % bolus 1,000 mL (0 mLs Intravenous Stopped 04/06/22 1836)  levETIRAcetam (KEPPRA) IVPB 1000 mg/100 mL premix (0 mg Intravenous Stopped 04/06/22 1751)  LORazepam (ATIVAN) injection 2 mg (2 mg Intravenous Given 04/06/22 1830)    ED Course/ Medical Decision Making/ A&P                           Medical Decision Making Amount and/or Complexity of Data Reviewed Labs: ordered. Radiology: ordered.  Risk Prescription drug management.   46 yo M with a chief complaints of increased seizure frequency.  Going on for the past couple days.  The patient tells me he is compliant with his home medications.  He is a bit sleepy on exam.  Will observe in the ED.  Give him IV loading dose of Keppra.  He is also on Zonegran on record review.  Unable to check a level of out of this facility.  Will consider giving a one-time dose.  Also on my record review, patient appears to have  well-controlled HIV, last CD4 650   Patient tried to get up and was directed back to the bed by [facility] staff, had improvement of unresponsiveness with eye fluttering.  On my exam the patient appears to avoid light.  He previously would turn his head to the left.  No generalized shaking.  Seem to respond to pain.  Patient continues to have events here.  I discussed case with Dr. Wilford Corner, recommended continuous EEG monitoring.  Hospitalist admission.  Recommended giving another 2 g of Keppra now.  Due to boarding at  [facility] seems to be a prolonged wait.  I discussed the case with the ED provider, Dr. Jodi Mourning we will send the patient ED to ED.   No obvious electrolyte abnormality.  Mag is normal.    CRITICAL CARE Performed by: Rae Roam   Total critical care time: 35 minutes  Critical care time was exclusive of separately billable procedures and treating other patients.  Critical care was necessary to treat or prevent imminent or life-threatening deterioration.  Critical care was time spent personally by me on the following activities: development of treatment plan with patient and/or surrogate as well as nursing, discussions with consultants, evaluation of patient's response to treatment, examination of patient, obtaining history from patient or surrogate, ordering and performing treatments and interventions, ordering and review of laboratory studies, ordering and review of radiographic studies, pulse oximetry and re-evaluation of patient's condition.         Final Clinical Impression(s) / ED Diagnoses Final diagnoses:  Seizures [facility])    Rx / DC Orders ED Discharge Orders     None         Melene Plan, DO 04/06/22 1918

## 2022-04-06 NOTE — ED Notes (Signed)
Pt walked to door with fluids hanging, pulled all cardiac cords off, refused to sit back on bed until he could use phone to call wife. CT tech at bedside and pt refusing to sit down and stated he would walk. Tech advised that pt must sit down and lay back in bed to be transported to CT.

## 2022-04-06 NOTE — ED Notes (Signed)
Pt arrived from [facility] at this time. Pt postictal,nodding head to answer questions. EDP at bedside assessing. Pt grimaced when assessing right lower quad.

## 2022-04-06 NOTE — ED Provider Notes (Signed)
[facility]   CSN: [id number] Arrival date & time: 04/06/22  1605     History  Chief Complaint  Patient presents with   Seizures    Perry Norton is a 46 y.o. male.   Seizures  Patient is arriving as a transfer from [facility] ED due to persistent breakthrough seizure-like activity.  Per review of outside hospital note, patient has had reportedly 10 breakthrough seizures in the past few days despite being compliant with his home medication regimen.  Per the patient's wife, the patient has had seizures so frequently that he would only be awake for about an hour at a time and then he would unfortunately seize again following reportedly.  Patient's wife also endorses that he has been having nausea and abdominal pain over the past few days.  Per the patient, the patient drinks only socially, he has not been compliant with his seizure medication regimen.  Denies any additional drug use.  Patient endorses that he has been urinating blood occasionally      Home Medications Prior to Admission medications   Medication Sig Start Date End Date Taking? Authorizing Provider  amLODipine (NORVASC) 5 MG tablet Take 5 mg by mouth daily. 09/25/21  Yes [provider]  bictegravir-emtricitabine-tenofovir AF (BIKTARVY) 50-200-25 MG TABS tablet Take 1 tablet by mouth daily. Patient taking differently: Take 1 tablet by mouth at bedtime. 02/11/22  Yes Daiva EvesVan Dam, Lisette Grinderornelius N, MD  levETIRAcetam (KEPPRA) 750 MG tablet Take 1,500 mg by mouth 2 (two) times daily. 01/16/22  Yes [provider]  zonisamide (ZONEGRAN) 100 MG capsule Take 300 mg by mouth at bedtime. 11/12/21  Yes [provider]  lacosamide (VIMPAT) 200 MG TABS tablet Take 1 tablet (200 mg total) by mouth 2 (two) times daily. Patient not taking: Reported on 06/03/2021 07/21/17   Van ClinesAquino, Karen M, MD  rosuvastatin (CRESTOR) 10 MG tablet Take 1 tablet (10 mg total) by mouth  daily. Patient not taking: Reported on 04/06/2022 02/11/22   Daiva EvesVan Dam, Lisette Grinderornelius N, MD      Allergies    Onion    Review of Systems   Review of Systems  Neurological:  Positive for seizures.    Physical Exam Updated Vital Signs BP 121/88   Pulse 62   Temp 97.6 F (36.4 C) (Axillary)   Resp 14   Ht 6' (1.829 m)   Wt 83.9 kg   SpO2 99%   BMI 25.09 kg/m  Physical Exam Vitals and nursing note reviewed.  Constitutional:      General: He is not in acute distress.    Appearance: He is well-developed.  HENT:     Head: Normocephalic and atraumatic.  Eyes:     Conjunctiva/sclera: Conjunctivae normal.  Cardiovascular:     Rate and Rhythm: Normal rate and regular rhythm.     Heart sounds: No murmur heard. Pulmonary:     Effort: Pulmonary effort is normal. No respiratory distress.     Breath sounds: Normal breath sounds.  Abdominal:     Palpations: Abdomen is soft.     Tenderness: There is abdominal tenderness.     Comments: Significant tenderness to palpation in the right mid abdomen  Musculoskeletal:        General: No swelling.     Cervical back: Neck supple.  Skin:    General: Skin is warm and dry.     Capillary Refill: Capillary refill takes less than 2 seconds.  Neurological:  Mental Status: He is alert. He is disoriented.     Comments: Not oriented to time place, confused, appears postictal  Psychiatric:        Mood and Affect: Mood normal.     ED Results / Procedures / Treatments   Labs (all labs ordered are listed, but only abnormal results are displayed) Labs Reviewed  CBC WITH DIFFERENTIAL/PLATELET - Abnormal; Notable for the following components:      Result Value   WBC 3.3 (*)    Neutro Abs 1.6 (*)    All other components within normal limits  COMPREHENSIVE METABOLIC PANEL - Abnormal; Notable for the following components:   Glucose, Bld 105 (*)    Creatinine, Ser 1.66 (*)    Calcium 8.3 (*)    AST 55 (*)    GFR, Estimated 52 (*)    All other  components within normal limits  I-STAT CHEM 8, ED - Abnormal; Notable for the following components:   Creatinine, Ser 1.50 (*)    Calcium, Ion 1.03 (*)    All other components within normal limits  MAGNESIUM  LACOSAMIDE  LEVETIRACETAM LEVEL  LIPASE, BLOOD    EKG None  Radiology CT Head Wo Contrast  Result Date: 04/06/2022 CLINICAL DATA:  Possible seizure. EXAM: CT HEAD WITHOUT CONTRAST TECHNIQUE: Contiguous axial images were obtained from the base of the skull through the vertex without intravenous contrast. RADIATION DOSE REDUCTION: This exam was performed according to the departmental dose-optimization program which includes automated exposure control, adjustment of the mA and/or kV according to patient size and/or use of iterative reconstruction technique. COMPARISON:  06/03/2021 FINDINGS: Brain: Normal appearing cerebral hemispheres and posterior fossa structures. Normal size and position of the ventricles. No intracranial hemorrhage, mass lesion or CT evidence of acute infarction. Vascular: No hyperdense vessel or unexpected calcification. Skull: Normal. Negative for fracture or focal lesion. Sinuses/Orbits: Unremarkable. Other: None. IMPRESSION: Normal examination. Electronically Signed   By: Claudie Revering M.D.   On: 04/06/2022 19:15    Procedures Procedures    Medications Ordered in ED Medications  zonisamide (ZONEGRAN) capsule 300 mg (has no administration in time range)  lactated ringers bolus 1,000 mL (has no administration in time range)  sodium chloride 0.9 % bolus 1,000 mL (0 mLs Intravenous Stopped 04/06/22 1836)  levETIRAcetam (KEPPRA) IVPB 1000 mg/100 mL premix (0 mg Intravenous Stopped 04/06/22 1751)  LORazepam (ATIVAN) injection 2 mg (2 mg Intravenous Given 04/06/22 1830)  levETIRAcetam (KEPPRA) 2,000 mg in sodium chloride 0.9 % 250 mL IVPB (2,000 mg Intravenous New Bag/Given 04/06/22 1923)  midazolam (VERSED) injection 2 mg (2 mg Intravenous Given 04/06/22 2011)     ED Course/ Medical Decision Making/ A&P                          Medical Decision Making Amount and/or Complexity of Data Reviewed Independent Historian: spouse and EMS External Data Reviewed: labs, radiology, ECG and notes. Labs: ordered. Decision-making details documented in ED Course. Radiology: ordered. Decision-making details documented in ED Course. ECG/medicine tests:  Decision-making details documented in ED Course.  Risk Prescription drug management. Decision regarding hospitalization.   Medical Decision Making  This patient is Presenting for Evaluation of seizures, patient has a past medical history HIV, epilepsy which complicates their presentation.  Of which does require a range of treatment options, and is a complaint that involves a high risk of morbidity and mortality.  Arrived in ED by:  EMS History obtained from: The  patient  Limitations in history: none    At this time I am most concerned for breakthrough seizures. Also considering metabolic abnormality, intracranial abnormality, intra-abdominal pathology, gastroenteritis, urinary tract infection, obstructive nephrolithiasis, pyelonephritis, medication noncompliance, drug intoxication, alcohol withdrawal seizures, medication side effect. Plan for laboratory and imaging study work-up    EKG: I interpreted the ECG. It reveals a sinus rhythm. The QTc, PR, and QRS are appropriate. There are no signs of acute ischemia or of significant electrical abnormalities. The ECG does not show a STEMI. There are no ST depressions. There are no T wave inversions. There is no evidence of a High-Grade Conduction Block.  Tall R waves noted in lead 5 and 6, could be concerning for that of hypertrophic cardiomyopathy   Laboratory work-up significant for:  -Laboratory work obtained at the outside hospital revealed a mild leukopenia with a WBC of 3.3, neutrophil count of 1.6, lower than prior studies month ago.  CMP revealed evidence  of an AKI with a new creatinine elevation of 1.66, also mild elevation in liver enzymes with an AST of 55, ALT of 26, no change in bilirubin, no additional significant metabolic abnormalities.  Magnesium 2.2 -Additional laboratory work-up obtained on arrival to the ED revealed a lipase 27, coagulation studies unremarkable, ethanol less than 10   Radiologic work-up was significant for: -CT head obtained from the outside hospital unremarkable. -CT abdomen and pelvis revealed no evidence of acute abnormality   Interventions and Interval History: -On arrival to the emergency department, the patient was obviously postictal.  Per EMS report, patient had seizure-like activity just prior to departure from the prior ED and was given Versed.  On arrival to the ED patient was somnolent, disoriented, not oriented to time or place.  Minimally conversive.  While under my observation, patient became more awake and alert but agitated, noncompliant, difficult to redirect.  - Patient then had an episode what appeared to be additional seizure-like activity.  Patient became unresponsive and had upward gaze deviation.  No tonic-clonic seizure-like activity was noted.  Patient seizure-like activity stopped however he had persistent gaze deviation was given 1 mg Ativan, gaze deviation ceased. -Patient was persistently agitated, disoriented, unable to compensate appropriately.  I spoke with the patient's wife and encouraged her to come to the emergency department as I am concerned for the patient's health and that his persistent seizure-like activity is likely contributing to his disorientation.  Patient's wife stated that she would only come to the emergency department for emergency.  Encourage [place] to sedate the patient if needed.  Due to the patient's persistent agitation, patient's lack of orientation, patient's inability to comply with medical testing, patient required Haldol for agitation management. -Spoke with neurology  who recommended continuous EEG monitoring, loading the patient with fosphenytoin and begin the patient on 100 mg fosphenytoin 3 times daily. -Patient will require admission for additional neurology evaluation, EEG monitoring and medication management.    Additional documents reviewed: Outside Hospital record    Disposition: Due to the patients current presenting symptoms, physical exam findings, and the workup stated above, it is thought that the etiology of the patients current presentation is breakthrough seizure-like activity in setting of known epilepsy    ADMIT: Patient is thought to require admission for EEG monitoring, additional  work-up, medication management. Patient will be admitted to hospitalist service. Please see in patient provider note for additional treatment plan details.   The plan for this patient was discussed with Dr. Reather Converse, who voiced agreement and  who oversaw evaluation and treatment of this patient.     Clinical Complexity  A medically appropriate history, review of systems, and physical exam was performed.   I personally reviewed the lab and imaging studies discussed above.   MDM generated using voice dictation software and may contain dictation errors. Please contact me for any clarification or with any questions.             Final Clinical Impression(s) / ED Diagnoses Final diagnoses:  Seizures [facility])    Rx / [place] Orders ED Discharge Orders     None         Inri Sobieski, [place], MD 04/07/22 CR:[id number]    Elnora Morrison, MD 04/11/22 0010
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
## 2022-04-06 NOTE — ED Notes (Signed)
Patient transported to X-ray 

## 2022-04-06 NOTE — ED Notes (Signed)
Pt in CT.

## 2022-04-06 NOTE — Subjective & Objective (Signed)
Patient with known history of partial seizures on Zonegran and Keppra comes in with questionable seizures. Reports multiple seizure events over the past few days initially he did not provide much history but eventually volunteered that he may have had 10 seizures Has severe seizure disorder and has been considered full surgical evaluation given his seizures. His symptoms have gotten worse today. Was given a load of Keppra 2000 mg

## 2022-04-06 NOTE — ED Notes (Signed)
Pt pulling at tubes and wires trying to get out of bed, helped pt back into bed, pt then went unresponsive, eyes fluttering and pt drooling, pt appears to have had a seizure for aprox 10-15 seconds, md at bedside,

## 2022-04-06 NOTE — Assessment & Plan Note (Signed)
Appreciate neurology consult Defer to neurology regarding dosing of antiseizure medications An EEG Order seizure precautions

## 2022-04-06 NOTE — ED Notes (Signed)
Pt was sitting at the bottom of his bed requesting to leave. Pt attempted to stand multiple times, and made an attempt to remove his iv stating "If you will not remove it, I will". Pt redirected several times by staff. Pt then started having a seizure and we were able to get him back in the bed.

## 2022-04-06 NOTE — ED Notes (Signed)
Bed alarm placed.  

## 2022-04-06 NOTE — Assessment & Plan Note (Signed)
Check HIV CD4 count and resume [employer]

## 2022-04-06 NOTE — ED Provider Notes (Signed)
ATTENDING SUPERVISORY NOTE I have personally viewed the imaging studies performed. I have personally seen and examined the patient, and discussed the plan of care with the resident.  I have reviewed the documentation of the resident and agree.  Seizures ([place])  CRITICAL CARE Performed by: Mariea Clonts   Total critical care time: 80 minutes  Critical care time was exclusive of separately billable procedures and treating other patients.  Critical care was necessary to treat or prevent imminent or life-threatening deterioration.  Critical care was time spent personally by me on the following activities: development of treatment plan with patient and/or surrogate as well as nursing, discussions with consultants, evaluation of patient's response to treatment, examination of patient, obtaining history from patient or surrogate, ordering and performing treatments and interventions, ordering and review of laboratory studies, ordering and review of radiographic studies, pulse oximetry and re-evaluation of patient's condition.    Elnora Morrison, MD 04/11/22 0010

## 2022-04-06 NOTE — ED Notes (Signed)
Pt is confused, re oriented pt, pt knows self, doesn't know day of the week or place, bed rails up, call bell within reach, pt can move all extremities, pt is generally weak, no slurred speech, facial droop, pupils are 74mm and reactive to light.

## 2022-04-06 NOTE — ED Notes (Signed)
Radiology called and stated pt refuses to lay down for CT. EDP made aware and walked to CT.

## 2022-04-06 NOTE — ED Notes (Signed)
Patient denies pain and is resting comfortably.  

## 2022-04-06 NOTE — Plan of Care (Signed)
ON CALL PHONE CONSULT  Call from Dr. [email or web address]  [facility]   Discussion Patient with a history of complex partial seizures, on Zonegran at 300 nightly and Keppra 1500 twice daily coming in with multiple events with the ED providers concern for possible real and psychogenic episodes.  Patient is being followed by neurology at [facility] and being considered for surgical evaluation due to intractable nature of his seizures. Given the multiplicity of his events, I would recommend admission for LTM EEG for spell characterization since he has reported multiple episodes in the past week with acute worsening today.  My recommendations: Admit to [facility] for LTM hookup.  If no beds are available, consider sending him to [facility] health where he can get LTM EEG since his neurology team is over at [place]. Continue his current home medications of Keppra 1500 twice daily and Zonegran 300 nightly.  Given additional load of Keppra 2000 mg load now. I will notify my team at [facility], to expect this patient.  Please notify the neurology team of patient arrival    -- [facility], MD Neurologist [facility] Pager: [phone]   No charge note

## 2022-04-06 NOTE — ED Notes (Signed)
[facility] here to get pt 

## 2022-04-07 ENCOUNTER — Inpatient Hospital Stay (HOSPITAL_COMMUNITY): Payer: Medicaid Other

## 2022-04-07 DIAGNOSIS — F17210 Nicotine dependence, cigarettes, uncomplicated: Secondary | ICD-10-CM | POA: Diagnosis not present

## 2022-04-07 DIAGNOSIS — M5412 Radiculopathy, cervical region: Secondary | ICD-10-CM | POA: Diagnosis not present

## 2022-04-07 DIAGNOSIS — M6282 Rhabdomyolysis: Secondary | ICD-10-CM

## 2022-04-07 DIAGNOSIS — G40909 Epilepsy, unspecified, not intractable, without status epilepticus: Secondary | ICD-10-CM

## 2022-04-07 DIAGNOSIS — G40219 Localization-related (focal) (partial) symptomatic epilepsy and epileptic syndromes with complex partial seizures, intractable, without status epilepticus: Secondary | ICD-10-CM | POA: Diagnosis not present

## 2022-04-07 DIAGNOSIS — R569 Unspecified convulsions: Secondary | ICD-10-CM | POA: Diagnosis not present

## 2022-04-07 DIAGNOSIS — Z91018 Allergy to other foods: Secondary | ICD-10-CM | POA: Diagnosis not present

## 2022-04-07 DIAGNOSIS — I129 Hypertensive chronic kidney disease with stage 1 through stage 4 chronic kidney disease, or unspecified chronic kidney disease: Secondary | ICD-10-CM | POA: Diagnosis not present

## 2022-04-07 DIAGNOSIS — R109 Unspecified abdominal pain: Secondary | ICD-10-CM | POA: Diagnosis present

## 2022-04-07 DIAGNOSIS — Z91148 Patient's other noncompliance with medication regimen for other reason: Secondary | ICD-10-CM | POA: Diagnosis not present

## 2022-04-07 DIAGNOSIS — B20 Human immunodeficiency virus [HIV] disease: Secondary | ICD-10-CM | POA: Diagnosis not present

## 2022-04-07 DIAGNOSIS — R9431 Abnormal electrocardiogram [ECG] [EKG]: Secondary | ICD-10-CM | POA: Diagnosis not present

## 2022-04-07 DIAGNOSIS — N179 Acute kidney failure, unspecified: Secondary | ICD-10-CM | POA: Diagnosis not present

## 2022-04-07 DIAGNOSIS — N1831 Chronic kidney disease, stage 3a: Secondary | ICD-10-CM | POA: Diagnosis not present

## 2022-04-07 DIAGNOSIS — E559 Vitamin D deficiency, unspecified: Secondary | ICD-10-CM | POA: Diagnosis not present

## 2022-04-07 DIAGNOSIS — Z79899 Other long term (current) drug therapy: Secondary | ICD-10-CM | POA: Diagnosis not present

## 2022-04-07 HISTORY — DX: Unspecified abdominal pain: R10.9

## 2022-04-07 HISTORY — DX: Epilepsy, unspecified, not intractable, without status epilepticus: G40.909

## 2022-04-07 HISTORY — DX: Rhabdomyolysis: M62.82

## 2022-04-07 LAB — URINALYSIS, ROUTINE W REFLEX MICROSCOPIC
Bilirubin Urine: NEGATIVE
Glucose, UA: NEGATIVE mg/dL
Hgb urine dipstick: NEGATIVE
Ketones, ur: 20 mg/dL — AB
Leukocytes,Ua: NEGATIVE
Nitrite: NEGATIVE
Protein, ur: NEGATIVE mg/dL
Specific Gravity, Urine: 1.021 (ref 1.005–1.030)
pH: 6 (ref 5.0–8.0)

## 2022-04-07 LAB — COMPREHENSIVE METABOLIC PANEL
ALT: 30 U/L (ref 0–44)
AST: 68 U/L — ABNORMAL HIGH (ref 15–41)
Albumin: 3.9 g/dL (ref 3.5–5.0)
Alkaline Phosphatase: 59 U/L (ref 38–126)
Anion gap: 9 (ref 5–15)
BUN: 8 mg/dL (ref 6–20)
CO2: 25 mmol/L (ref 22–32)
Calcium: 8.7 mg/dL — ABNORMAL LOW (ref 8.9–10.3)
Chloride: 105 mmol/L (ref 98–111)
Creatinine, Ser: 1.26 mg/dL — ABNORMAL HIGH (ref 0.61–1.24)
GFR, Estimated: >60 mL/min (ref >60)
Glucose, Bld: 136 mg/dL — ABNORMAL HIGH (ref 70–99)
Potassium: 3.6 mmol/L (ref 3.5–5.1)
Sodium: 139 mmol/L (ref 135–145)
Total Bilirubin: 0.4 mg/dL (ref 0.3–1.2)
Total Protein: 6.5 g/dL (ref 6.5–8.1)

## 2022-04-07 LAB — LEVETIRACETAM LEVEL: Levetiracetam Lvl: 16.8 ug/mL (ref 10.0–40.0)

## 2022-04-07 LAB — RAPID URINE DRUG SCREEN, HOSP PERFORMED
Amphetamines: NOT DETECTED
Barbiturates: NOT DETECTED
Benzodiazepines: POSITIVE — AB
Cocaine: NOT DETECTED
Opiates: NOT DETECTED
Tetrahydrocannabinol: POSITIVE — AB

## 2022-04-07 LAB — CK
Total CK: 6543 U/L — ABNORMAL HIGH (ref 49–397)
Total CK: 7314 U/L — ABNORMAL HIGH (ref 49–397)

## 2022-04-07 LAB — TSH: TSH: 4.382 u[IU]/mL (ref 0.350–4.500)

## 2022-04-07 LAB — LACOSAMIDE: Lacosamide: <0.5 ug/mL — ABNORMAL LOW (ref 5.0–10.0)

## 2022-04-07 LAB — T-HELPER CELLS (CD4) COUNT (NOT AT ARMC)
CD4 % Helper T Cell: 38 % (ref 33–65)
CD4 T Cell Abs: 529 /uL (ref 400–1790)

## 2022-04-07 LAB — MAGNESIUM: Magnesium: 2 mg/dL (ref 1.7–2.4)

## 2022-04-07 LAB — OSMOLALITY: Osmolality: 293 mOsm/kg (ref 275–295)

## 2022-04-07 LAB — PROTIME-INR
INR: 1.1 (ref 0.8–1.2)
Prothrombin Time: 14 seconds (ref 11.4–15.2)

## 2022-04-07 LAB — ETHANOL: Alcohol, Ethyl (B): <10 mg/dL (ref <10)

## 2022-04-07 LAB — PHOSPHORUS
Phosphorus: 2.9 mg/dL (ref 2.5–4.6)
Phosphorus: 2.9 mg/dL (ref 2.5–4.6)

## 2022-04-07 LAB — PREALBUMIN: Prealbumin: 17 mg/dL — ABNORMAL LOW (ref 18–38)

## 2022-04-07 MED ORDER — LEVETIRACETAM IN NACL 1500 MG/100ML IV SOLN
1500.0000 mg | Freq: Two times a day (BID) | INTRAVENOUS | Status: DC
  Administered 2022-04-07: 1500 mg via INTRAVENOUS
  Filled 2022-04-07 (×4): qty 100

## 2022-04-07 MED ORDER — SODIUM CHLORIDE 0.9 % IV BOLUS
1000.0000 mL | Freq: Once | INTRAVENOUS | Status: AC
  Administered 2022-04-07: 1000 mL via INTRAVENOUS

## 2022-04-07 MED ORDER — ACETAMINOPHEN 650 MG RE SUPP: 650.0000 mg | Freq: Four times a day (QID) | RECTAL | Status: DC | PRN

## 2022-04-07 MED ORDER — PHENYTOIN SODIUM 50 MG/ML IJ SOLN
100.0000 mg | Freq: Three times a day (TID) | INTRAMUSCULAR | Status: DC
  Filled 2022-04-07 (×6): qty 2

## 2022-04-07 MED ORDER — BICTEGRAVIR-EMTRICITAB-TENOFOV 50-200-25 MG PO TABS
1.0000 | ORAL_TABLET | Freq: Every day | ORAL | Status: DC
  Filled 2022-04-07 (×2): qty 1

## 2022-04-07 MED ORDER — SODIUM CHLORIDE 0.9 % IV SOLN: INTRAVENOUS | Status: AC

## 2022-04-07 MED ORDER — SODIUM CHLORIDE 0.9 % IV SOLN: INTRAVENOUS | Status: DC | PRN

## 2022-04-07 MED ORDER — ZONISAMIDE 100 MG PO CAPS
300.0000 mg | ORAL_CAPSULE | Freq: Every day | ORAL | Status: DC
  Filled 2022-04-07 (×2): qty 3

## 2022-04-07 MED ORDER — LORAZEPAM 1 MG PO TABS
1.0000 mg | ORAL_TABLET | Freq: Four times a day (QID) | ORAL | Status: DC | PRN
  Filled 2022-04-07: qty 1

## 2022-04-07 MED ORDER — HYDROCODONE-ACETAMINOPHEN 5-325 MG PO TABS: 1.0000 | ORAL_TABLET | ORAL | Status: DC | PRN

## 2022-04-07 MED ORDER — HALOPERIDOL LACTATE 5 MG/ML IJ SOLN
5.0000 mg | Freq: Once | INTRAMUSCULAR | Status: AC
  Administered 2022-04-07: 5 mg via INTRAVENOUS
  Filled 2022-04-07: qty 1

## 2022-04-07 MED ORDER — ACETAMINOPHEN 325 MG PO TABS: 650.0000 mg | ORAL_TABLET | Freq: Four times a day (QID) | ORAL | Status: DC | PRN

## 2022-04-07 NOTE — ED Notes (Signed)
Patient refuses bloodwork, scans, meds, and vitals. Patient asleep in bed at this time. (Laying with eyes closed).

## 2022-04-07 NOTE — Evaluation (Signed)
Physical Therapy Evaluation and Discharge Patient Details Name: Perry Norton MRN: [id number] DOB: [birth year] Today's Date: 04/07/2022  History of Present Illness  Pt is a 46 y/o male admitted for breakthrough seizures. PMH includes HIV and seizures.  Clinical Impression  Pt admitted secondary to problem above with deficits below. Pt agitated throughout and answering with very short responses. Impulsively got off of stretcher and stomped around the room and immediately returned to bed. Did not require physical assist. Refused to perform further mobility. Pt reporting new R hip pain, but did not allow PT to assess and did not want any modalities. Pt overall at a mod I level. No further PT needs at this time. Will sign off. If needs change, please re-consult.        Recommendations for follow up therapy are one component of a multi-disciplinary discharge planning process, led by the attending physician.  Recommendations may be updated based on patient status, additional functional criteria and insurance authorization.  Follow Up Recommendations No PT follow up      Assistance Recommended at Discharge PRN  Patient can return home with the following       Equipment Recommendations None recommended by PT  Recommendations for Other Services       Functional Status Assessment Patient has not had a recent decline in their functional status     Precautions / Restrictions Precautions Precautions: None Restrictions Weight Bearing Restrictions: No      Mobility  Bed Mobility Overal bed mobility: Independent                  Transfers Overall transfer level: Modified independent                      Ambulation/Gait Ambulation/Gait assistance: Modified independent (Device/Increase time) Gait Distance (Feet): 30 Feet Assistive device: None Gait Pattern/deviations: Step-through pattern Gait velocity: Decreased     General Gait Details: Pt agitated with PT and  impulsively got up from stretcher and stomped around the room. Did not require physical assist. Refused to perform further mobility  Stairs            Wheelchair Mobility    Modified Rankin (Stroke Patients Only)       Balance Overall balance assessment: No apparent balance deficits (not formally assessed)                                           Pertinent Vitals/Pain Pain Assessment Pain Assessment: Faces Faces Pain Scale: Hurts little more Pain Location: R hip Pain Descriptors / Indicators: Grimacing, Guarding Pain Intervention(s): Monitored during session, Limited activity within patient's tolerance, Repositioned    Home Living Family/patient expects to be discharged to:: Private residence Living Arrangements: Non-relatives/Friends Available Help at Discharge: Family Type of Home: House Home Access: Level entry     Alternate Level Stairs-Number of Steps: 15 Home Layout: Two level Home Equipment: None      Prior Function Prior Level of Function : Independent/Modified Independent                     Hand Dominance        Extremity/Trunk Assessment   Upper Extremity Assessment Upper Extremity Assessment: Overall WFL for tasks assessed    Lower Extremity Assessment Lower Extremity Assessment: RLE deficits/detail RLE Deficits / Details: Reports R hip pain, but would not allow  PT to assess. Did not limit mobility    Cervical / Trunk Assessment Cervical / Trunk Assessment: Normal  Communication   Communication: No difficulties  Cognition Arousal/Alertness: Awake/alert Behavior During Therapy: Agitated Overall Cognitive Status: No family/caregiver present to determine baseline cognitive functioning                                 General Comments: Pt annoyed with PT presence and providing very short answers. When it came time to move around the room, pt impulsively sat up and stomped around the room and returned  to bed, refusing to do more.        General Comments General comments (skin integrity, edema, etc.): No family present    Exercises     Assessment/Plan    PT Assessment Patient does not need any further PT services  PT Problem List         PT Treatment Interventions      PT Goals (Current goals can be found in the Care Plan section)  Acute Rehab PT Goals Patient Stated Goal: none stated PT Goal Formulation: With patient Time For Goal Achievement: 04/07/22 Potential to Achieve Goals: Good    Frequency       Co-evaluation               AM-PAC PT "6 Clicks" Mobility  Outcome Measure Help needed turning from your back to your side while in a flat bed without using bedrails?: None Help needed moving from lying on your back to sitting on the side of a flat bed without using bedrails?: None Help needed moving to and from a bed to a chair (including a wheelchair)?: None Help needed standing up from a chair using your arms (e.g., wheelchair or bedside chair)?: None Help needed to walk in hospital room?: None Help needed climbing 3-5 steps with a railing? : None 6 Click Score: 24    End of Session   Activity Tolerance: Treatment limited secondary to agitation Patient left: in bed;with call bell/phone within reach (on stretcher in ED) Nurse Communication: Mobility status PT Visit Diagnosis: Other abnormalities of gait and mobility (R26.89)    Time: 1056-1106 PT Time Calculation (min) (ACUTE ONLY): 10 min   Charges:   PT Evaluation $PT Eval Moderate Complexity: 1 Mod          Farley Ly, PT, DPT  Acute Rehabilitation Services  Office: [phone]   Lehman Prom 04/07/2022, 3:59 PM

## 2022-04-07 NOTE — Discharge Instructions (Signed)
Seizure precautions: Per [facility] statutes, patients with seizures are not allowed to drive until they have been seizure-free for six months and cleared by a physician    Use caution when using heavy equipment or power tools. Avoid working on ladders or at heights. Take showers instead of baths. Ensure the water temperature is not too high on the home water heater. Do not go swimming alone. Do not lock yourself in a room alone (i.e. bathroom). When caring for infants or small children, sit down when holding, feeding, or changing them to minimize risk of injury to the child in the event you have a seizure. Maintain good sleep hygiene. Avoid alcohol.    If patient has another seizure, call 911 and bring them back to the ED if: A.  The seizure lasts longer than 5 minutes.      B.  The patient doesn't wake shortly after the seizure or has new problems such as difficulty seeing, speaking or moving following the seizure C.  The patient was injured during the seizure D.  The patient has a temperature over 102 F (39C) E.  The patient vomited during the seizure and now is having trouble breathing    During the Seizure   - First, ensure adequate ventilation and place patients on the floor on their left side  Loosen clothing around the neck and ensure the airway is patent. If the patient is clenching the teeth, do not force the mouth open with any object as this can cause severe damage - Remove all items from the surrounding that can be hazardous. The patient may be oblivious to what's happening and may not even know what he or she is doing. If the patient is confused and wandering, either gently guide him/her away and block access to outside areas - Reassure the individual and be comforting - Call 911. In most cases, the seizure ends before EMS arrives. However, there are cases when seizures may last over 3 to 5 minutes. Or the individual may have developed breathing difficulties or severe  injuries. If a pregnant patient or a person with diabetes develops a seizure, it is prudent to call an ambulance. - Finally, if the patient does not regain full consciousness, then call EMS. Most patients will remain confused for about 45 to 90 minutes after a seizure, so you must use judgment in calling for help. - Avoid restraints but make sure the patient is in a bed with padded side rails - Place the individual in a lateral position with the neck slightly flexed; this will help the saliva drain from the mouth and prevent the tongue from falling backward - Remove all nearby furniture and other hazards from the area - Provide verbal assurance as the individual is regaining consciousness - Provide the patient with privacy if possible - Call for help and start treatment as ordered by the caregiver    After the Seizure (Postictal Stage)   After a seizure, most patients experience confusion, fatigue, muscle pain and/or a headache. Thus, one should permit the individual to sleep. For the next few days, reassurance is essential. Being calm and helping reorient the person is also of importance.   Most seizures are painless and end spontaneously. Seizures are not harmful to others but can lead to complications such as stress on the lungs, brain and the heart. Individuals with prior lung problems may develop labored breathing and respiratory distress.    Additional Discharge Instructions   Please get your medications  reviewed and adjusted by your Primary MD.  Please request your Primary MD to go over all Hospital Tests and Procedure/Radiological results at the follow up, please get all Hospital records sent to your Prim MD by signing hospital release before you go home.  If you had Pneumonia of Lung problems at the Hospital: Please get a 2 view Chest X ray done in approximately 4 weeks after hospital discharge or sooner if instructed by your Primary MD.  If you have Congestive Heart  Failure: Please call your Cardiologist or Primary MD anytime you have any of the following symptoms:  1) 3 pound weight gain in 24 hours or 5 pounds in 1 week  2) shortness of breath, with or without a dry hacking cough  3) swelling in the hands, feet or stomach  4) if you have to sleep on extra pillows at night in order to breathe  Follow cardiac low salt diet and 1.5 lit/day fluid restriction.  If you have diabetes Accuchecks 4 times/day, Once in AM empty stomach and then before each meal. Log in all results and show them to your primary doctor at your next visit. If any glucose reading is under 80 or above 300 call your primary MD immediately.  If you have Seizure/Convulsions/Epilepsy: Please do not drive, operate heavy machinery, participate in activities at heights or participate in high speed sports until you have seen by Primary MD or a Neurologist and advised to do so again. Per [facility] statutes, patients with seizures are not allowed to drive until they have been seizure-free for six months.  Use caution when using heavy equipment or power tools. Avoid working on ladders or at heights. Take showers instead of baths. Ensure the water temperature is not too high on the home water heater. Do not go swimming alone. Do not lock yourself in a room alone (i.e. bathroom). When caring for infants or small children, sit down when holding, feeding, or changing them to minimize risk of injury to the child in the event you have a seizure. Maintain good sleep hygiene. Avoid alcohol.   If you had Gastrointestinal Bleeding: Please ask your Primary MD to check a complete blood count within one week of discharge or at your next visit. Your endoscopic/colonoscopic biopsies that are pending at the time of discharge, will also need to followed by your Primary MD.  Get Medicines reviewed and adjusted. Please take all your medications with you for your next visit with your Primary MD  Please  request your Primary MD to go over all hospital tests and procedure/radiological results at the follow up, please ask your Primary MD to get all Hospital records sent to his/her office.  If you experience worsening of your admission symptoms, develop shortness of breath, life threatening emergency, suicidal or homicidal thoughts you must seek medical attention immediately by calling 911 or calling your MD immediately  if symptoms less severe.  You must read complete instructions/literature along with all the possible adverse reactions/side effects for all the Medicines you take and that have been prescribed to you. Take any new Medicines after you have completely understood and accpet all the possible adverse reactions/side effects.   Do not drive or operate heavy machinery when taking Pain medications.   Do not take more than prescribed Pain, Sleep and Anxiety Medications  Special Instructions: If you have smoked or chewed Tobacco  in the last 2 yrs please stop smoking, stop any regular Alcohol  and or any Recreational drug  use.  Wear Seat belts while driving.  Please note You were cared for by a hospitalist during your hospital stay. If you have any questions about your discharge medications or the care you received while you were in the hospital after you are discharged, you can call the unit and asked to speak with the hospitalist on call if the hospitalist that took care of you is not available. Once you are discharged, your primary care physician will handle any further medical issues. Please note that NO REFILLS for any discharge medications will be authorized once you are discharged, as it is imperative that you return to your primary care physician (or establish a relationship with a primary care physician if you do not have one) for your aftercare needs so that they can reassess your need for medications and monitor your lab values.  You can reach the hospitalist office at phone  [phone] or fax [phone]   If you do not have a primary care physician, you can call [phone] for a physician referral.

## 2022-04-07 NOTE — ED Notes (Signed)
Pt refused to let tech take his temp. Nurse is aware

## 2022-04-07 NOTE — ED Notes (Addendum)
Stands up to close door and blinds at this time despite being educated on falls risk. Call bell is at bedside with urinal and items within reach from the bed

## 2022-04-07 NOTE — ED Notes (Signed)
MD made aware patient seems more cooperative. He stated that he was hungry and just wanted something to eat. Patient was advised that I would work on that if he was staying to be treated and not leaving AMA. I advised him that EEG needed to be done and that if any medications were ordered that I would need another IV. Patient laying in bed and not very talkative.

## 2022-04-07 NOTE — Progress Notes (Signed)
PROGRESS NOTE        PATIENT DETAILS Name: Perry Norton Age: 46 y.o. Sex: male Date of Birth: [birth year] Admit Date: 04/06/2022 Admitting Physician Therisa Doyne, MD PCP:Pcp, No  Brief Summary: Patient is a 46 y.o.  male with HIV, seizure disorder-presented with breakthrough seizures.  Significant events: 10/16>> admit to [facility] for breakthrough seizures.  Significant studies: 10/16>> CT head: No acute intracranial abnormality 10/16>> CT abdomen/pelvis: Normal CT abdomen/pelvis.  Significant microbiology data: None  Procedures: None  Consults: Neurology  Subjective: Was sleeping when I saw him-he opened his eyes-answered simple questions appropriately.  Apparently has been uncooperative with nursing staff and has threatened to sign out AMA.  Objective: Vitals: Blood pressure 116/86, pulse 83, temperature 97.6 F (36.4 C), temperature source Axillary, resp. rate 16, height 6' (1.829 m), weight 83.9 kg, SpO2 100 %.   Exam: Gen Exam:not in any distress HEENT:atraumatic, normocephalic Chest: B/L clear to auscultation anteriorly CVS:S1S2 regular Abdomen:soft non tender, non distended Extremities:no edema Neurology: Non focal Skin: no rash  Pertinent Labs/Radiology:    Latest Ref Rng & Units 04/06/2022    6:37 PM 04/06/2022    5:31 PM 02/11/2022   12:04 PM  CBC  WBC 4.0 - 10.5 K/uL  3.3  5.8   Hemoglobin 13.0 - 17.0 g/dL 19.1  47.8  29.5   Hematocrit 39.0 - 52.0 % 46.0  47.6  42.0   Platelets 150 - 400 K/uL  161  207     Lab Results  Component Value Date   NA 140 04/06/2022   K 4.0 04/06/2022   CL 101 04/06/2022   CO2 25 04/06/2022     Assessment/Plan: Epilepsy with breakthrough seizures Remains on Keppra/Zonegran-has been started on Dilantin. Seizure-free this morning Acknowledges compliance with medications-however levels pending. Has been very uncooperative with the nursing staff-threatening to leave AMA. Discussed with  neurologist-Dr. Ilda Basset that he may have been noncompliant, recommends that if patient leaves AGAINST MEDICAL ADVICE-to continue Keppra/Zonegran-and not to continue Dilantin due to possible interaction with his ART's. If patient remains hospitalized-neurology planning to do LTM EEG. Patient aware of driving restrictions/seizure restrictions.  He is aware that he needs to remain hospitalized to ensure safe discharge.  Rhabdomyolysis Mild Due to breakthrough seizures Gently hydrate if he allows-but has been uncooperative and pulled out his IV lines. Repeat CK levels tomorrow morning.  HIV ED for count 653 (on 8/23)  continue ART  BMI: Estimated body mass index is 25.09 kg/m as calculated from the following:   Height as of this encounter: 6' (1.829 m).   Weight as of this encounter: 83.9 kg.   Code status:   Code Status: Full Code   DVT Prophylaxis: SCDs Start: 04/07/22 0332   Family Communication:  None at bedside   Disposition Plan: Status is: Inpatient Remains inpatient appropriate because: Breakthrough seizures.   Planned Discharge Destination:Home   Diet: Diet Order             Diet regular Room service appropriate? Yes; Fluid consistency: Thin  Diet effective now                     Antimicrobial agents: Anti-infectives (From admission, onward)    Start     Dose/Rate Route Frequency Ordered Stop   04/07/22 2200  bictegravir-emtricitabine-tenofovir AF (BIKTARVY) 50-200-25 MG per tablet 1 tablet  1 tablet Oral Daily at bedtime 04/07/22 0332          MEDICATIONS: Scheduled Meds:  bictegravir-emtricitabine-tenofovir AF  1 tablet Oral QHS   phenytoin (DILANTIN) IV  100 mg Intravenous Q8H   zonisamide  300 mg Oral Once   zonisamide  300 mg Oral QHS   Continuous Infusions:  sodium chloride     sodium chloride     levETIRAcetam Stopped (04/07/22 0833)   PRN Meds:.sodium chloride, acetaminophen **OR** acetaminophen,  HYDROcodone-acetaminophen, LORazepam   I have personally reviewed following labs and imaging studies  LABORATORY DATA: CBC: Recent Labs  Lab 04/06/22 1731 04/06/22 1837  WBC 3.3*  --   NEUTROABS 1.6*  --   HGB 15.8 15.6  HCT 47.6 46.0  MCV 89.5  --   PLT 161  --     Basic Metabolic Panel: Recent Labs  Lab 04/06/22 1830 04/06/22 1837 04/06/22 2338  NA 138 140  --   K 3.6 4.0  --   CL 103 101  --   CO2 25  --   --   GLUCOSE 105* 98  --   BUN 14 15  --   CREATININE 1.66* 1.50*  --   CALCIUM 8.3*  --   --   MG 2.2  --   --   PHOS  --   --  2.9    GFR: Estimated Creatinine Clearance: 69 mL/min (A) (by C-G formula based on SCr of 1.5 mg/dL (H)).  Liver Function Tests: Recent Labs  Lab 04/06/22 1830  AST 55*  ALT 26  ALKPHOS 66  BILITOT 0.6  PROT 7.3  ALBUMIN 4.2   Recent Labs  Lab 04/06/22 2112  LIPASE 27   No results for input(s): "AMMONIA" in the last 168 hours.  Coagulation Profile: Recent Labs  Lab 04/06/22 2338  INR 1.1    Cardiac Enzymes: Recent Labs  Lab 04/06/22 2338  CKTOTAL 6,543*    BNP (last 3 results) No results for input(s): "PROBNP" in the last 8760 hours.  Lipid Profile: No results for input(s): "CHOL", "HDL", "LDLCALC", "TRIG", "CHOLHDL", "LDLDIRECT" in the last 72 hours.  Thyroid Function Tests: Recent Labs    04/06/22 2338  TSH 4.382    Anemia Panel: No results for input(s): "VITAMINB12", "FOLATE", "FERRITIN", "TIBC", "IRON", "RETICCTPCT" in the last 72 hours.  Urine analysis: No results found for: "COLORURINE", "APPEARANCEUR", "LABSPEC", "PHURINE", "GLUCOSEU", "HGBUR", "BILIRUBINUR", "KETONESUR", "PROTEINUR", "UROBILINOGEN", "NITRITE", "LEUKOCYTESUR"  Sepsis Labs: Lactic Acid, Venous    Component Value Date/Time   LATICACIDVEN 0.98 12/15/2017 1647    MICROBIOLOGY: No results found for this or any previous visit (from the past 240 hour(s)).  RADIOLOGY STUDIES/RESULTS: CT ABDOMEN PELVIS W  CONTRAST  Result Date: 04/06/2022 CLINICAL DATA:  Right lower quadrant abdominal pain, multiple seizures EXAM: CT ABDOMEN AND PELVIS WITH CONTRAST TECHNIQUE: Multidetector CT imaging of the abdomen and pelvis was performed using the standard protocol following bolus administration of intravenous contrast. RADIATION DOSE REDUCTION: This exam was performed according to the departmental dose-optimization program which includes automated exposure control, adjustment of the mA and/or kV according to patient size and/or use of iterative reconstruction technique. CONTRAST:  35mL OMNIPAQUE IOHEXOL 350 MG/ML SOLN COMPARISON:  None Available. FINDINGS: Lower chest: Lung bases are clear. Hepatobiliary: Liver is within normal limits. Gallbladder unremarkable. No intrahepatic or extrahepatic duct dilatation. Pancreas: Within normal limits. Spleen: Within normal limits Adrenals/Urinary Tract: Adrenal glands are within normal limits Kidneys are normal limits.  No hydronephrosis. Bladder is within  normal limits. Stomach/Bowel: Stomach is within normal limits. No evidence of bowel obstruction. Normal appendix (series 3/image 64). No colonic wall thickening or inflammatory changes. Vascular/Lymphatic: No evidence of abdominal aortic aneurysm. No suspicious abdominopelvic lymphadenopathy. Reproductive: Prostate is unremarkable. Other: No abdominopelvic ascites. Musculoskeletal: Visualized osseous structures are within normal limits. IMPRESSION: Normal CT abdomen/pelvis. Electronically Signed   By: Charline Bills M.D.   On: 04/06/2022 22:11   CT Head Wo Contrast  Result Date: 04/06/2022 CLINICAL DATA:  Possible seizure. EXAM: CT HEAD WITHOUT CONTRAST TECHNIQUE: Contiguous axial images were obtained from the base of the skull through the vertex without intravenous contrast. RADIATION DOSE REDUCTION: This exam was performed according to the departmental dose-optimization program which includes automated exposure control,  adjustment of the mA and/or kV according to patient size and/or use of iterative reconstruction technique. COMPARISON:  06/03/2021 FINDINGS: Brain: Normal appearing cerebral hemispheres and posterior fossa structures. Normal size and position of the ventricles. No intracranial hemorrhage, mass lesion or CT evidence of acute infarction. Vascular: No hyperdense vessel or unexpected calcification. Skull: Normal. Negative for fracture or focal lesion. Sinuses/Orbits: Unremarkable. Other: None. IMPRESSION: Normal examination. Electronically Signed   By: Beckie Salts M.D.   On: 04/06/2022 19:15     LOS: 0 days   Jeoffrey Massed, MD  [facility]    To contact the attending provider between 7A-7P or the covering provider during after hours 7P-7A, please log into the web site [email or web address] and access using universal [place] password for that web site. If you do not have the password, please call the hospital operator.  04/07/2022, 9:40 AM

## 2022-04-07 NOTE — ED Notes (Addendum)
This paramedic went in patient room to hang keppra. Patient was asleep. Patient was asked to get a BP. Patient did not refuse a BP. Keppra was hung at the same time by gravity due to patient being agitated with the pump.

## 2022-04-07 NOTE — ED Notes (Signed)
MD Bridgett Larsson made aware of patient and his agitation to staff. MD was advised of my fear of patient medication wearing off and patient being more adamant to leave AMA. Patient in bed at this time. MD ordered Haldol for patient.

## 2022-04-07 NOTE — ED Notes (Addendum)
This paramedic went into patient room to get updated vitals and hang some maintenance fluids on patient. Patient had taken himself off the monitor and was laying in the bed. Patient woke up as I walked in the door with little to no stimuli. I left lights off as I entered. I advised patient that I was just going to get some vitals and hang more fluids on him. Patient immediately refused and sat up in bed. Patient began pulling at his IV. Patient stopped when I asked him and was advised that we want to keep that IV only in the instance we need to give medication. Patient stated he wanted his wife called and he was asking where she was. I messaged Dr. Christy Gentles due to patients agitation. Patient slid himself to the bottom of his bed and stated that he wanted to go to the bathroom. Patient was offered a urinal due to the fact he has had numerous seizures and that I personally did not feel comfortable with him walking. Patient was adamant about walking and he grabbed his IV pole and pushed it very fast around the bed to the bathroom. Patient was followed but shut the door to the bathroom and locked it behind him. Once finished patient walked from bathroom pushing his IV pole back into the room. Patient got back into bed and was offered ativan to help him sleep. Patient refused ativan. Patient took the cup of water offered with the ativan. Patient laid back in bed and closed his eyes. Patient was given call bell and advised to let staff know if he needed anything. This paramedic left patient room at this time leaving the door cracked and window open since patient refuses to be placed on cardiac monitor.

## 2022-04-07 NOTE — ED Notes (Signed)
Provided snack and drink at this time patient and family updated on the plan of care and verbalize understanding

## 2022-04-07 NOTE — ED Notes (Signed)
Pt stated that he had to use the restroom , so I explained to him that he wasn't able to get up out the bed due to him begin on EEG . So I offer him an urinal and he threw it in the floor, Nurse was notified

## 2022-04-07 NOTE — Assessment & Plan Note (Signed)
Transient CT ab unremarkable

## 2022-04-07 NOTE — ED Notes (Signed)
Pt refused to have his vitals taking , and nurse is aware

## 2022-04-07 NOTE — Progress Notes (Signed)
LTM EEG running - no initial skin breakdown - push button tested - neuro notified.  

## 2022-04-07 NOTE — ED Notes (Signed)
Pt verbally aggressive with staff. Pt refusing EEG. Pt got out of bed and began pulling off monitor leads. Pt escorted back to bed and reconnected to cardiac monitor. MD notified. MD at bedside.

## 2022-04-07 NOTE — Assessment & Plan Note (Signed)
Rehydrate and follow CK  in the setting of possible seiure

## 2022-04-07 NOTE — ED Notes (Addendum)
Patient offered food again and turns down all options by saying "if I said it is nasty sh** before then I still don't want it".  patient provided with requested sprite

## 2022-04-07 NOTE — ED Notes (Addendum)
This RN asks if the patient would like any of his nighttime medications and explained the risks vs benefits. Patient reports "hel* no". And proceeds to stand up and report that he is going to walk to the restroom. This RN at bedside to see patient is unsteady standing on his feet and unable to walk. Patient continues to try to ambulate out the door despite many others educating the patient on the need to rest while EEG is recording. Patient continues to try and get up. This RN reminds the patient that he is connected to the EEG monitor and is very unsteady on his feet. Urinal at bedside was placed on the patients bed for better reach. Patient reports to staff that he "doesn't want any of that nasty s*it" that the staff are trying to provide him (food). Patients wife at bedside reports that she was going to obtain food for  the patient. Patient resting peacefully and reports no complaints at this time. Call bell in reach and door is open. Bay mates notified that the patient is a falls risk and verbalize understanding. Patient nearest to nurses station, call bell in reach, door open, falls risk precautions in place.

## 2022-04-07 NOTE — ED Notes (Signed)
Laboratory called at this time to ensure that the patient's correct orders were added onto the [facility] sent

## 2022-04-07 NOTE — ED Notes (Signed)
No vitals taken although are due. Patient too easily agitated

## 2022-04-07 NOTE — ED Notes (Signed)
This paramedic tried to go into patient room and start patient maintenance fluids again. Patient was at first easy to understand that I was in the room to start fluids and after a few minutes started to become agitated. This paramedic gave patient a dose of Haldol at this time. Patient let the fluids run for about 5 minutes before demanding I turn the pump off, Pump was turned back off at this time. Patient requesting his IV to be taken out. Patient was advised I could not do that for him until he was about to go home. Patient agitated and asking for his wife to be called to come get him at 7am. Patient fell back asleep.
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
## 2022-04-07 NOTE — ED Notes (Addendum)
Patient wanted IV removed and wanted to walk out. Patients IV was removed before he was about to remove it himself. Patient stated he would walk home. I asked patient to wait on the MD to see him. MD Ghimire aware of patient wanting to leave AMA. Patient was advised MD had been in to see him this morning and patient said he did not remember seeing any doc come in. While discussing AMA paperwork with patient he got up in his bed and fell asleep. MD was made aware of this as well.
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
## 2022-04-07 NOTE — Progress Notes (Signed)
EEG Tech is unable to place LTM EEG at this time. EEG Tech tried earlier, but patient was too uncooperative and agitated. Patient was attempting to leave, despite altered and confused state. ED staff gave patient Haldol between time EEG Tech left the first time and returned the second time.   While waiting for machine to boot up, patient hopped out of bed and told the RN to "take this IV out or I'll take it out." Patient does not seem like he will cooperate and EEG Tech does not feel comfortable forcing the EEG while patient is still experiencing high agitation.   Cheral Marker, MD was notified that EEG Tech could not place LTM EEG. Will attempt again later when patient maybe be more settled.

## 2022-04-07 NOTE — Progress Notes (Signed)
LTM EEG could not be placed overnight. EEG Tech attempted a third time at 5:00 a.a.m, but the RN turned EEG Tech away because patient had been uncooperative and agitated all night. Patient was more than likely asleep and RN did not want patient awakened and riled up. Day shift will have to attempt LTM placement. Cheral Marker, MD notified.

## 2022-04-08 DIAGNOSIS — B20 Human immunodeficiency virus [HIV] disease: Secondary | ICD-10-CM | POA: Diagnosis not present

## 2022-04-08 DIAGNOSIS — R109 Unspecified abdominal pain: Secondary | ICD-10-CM | POA: Diagnosis not present

## 2022-04-08 DIAGNOSIS — G40909 Epilepsy, unspecified, not intractable, without status epilepticus: Secondary | ICD-10-CM | POA: Diagnosis not present

## 2022-04-08 DIAGNOSIS — M6282 Rhabdomyolysis: Secondary | ICD-10-CM | POA: Diagnosis not present

## 2022-04-08 MED ORDER — LEVETIRACETAM 750 MG PO TABS: 1500.0000 mg | ORAL_TABLET | Freq: Two times a day (BID) | ORAL | 0 refills | Status: AC

## 2022-04-08 MED ORDER — LEVETIRACETAM 500 MG PO TABS
1500.0000 mg | ORAL_TABLET | Freq: Once | ORAL | Status: AC
  Administered 2022-04-08: 1500 mg via ORAL
  Filled 2022-04-08: qty 3

## 2022-04-08 MED ORDER — LACTATED RINGERS IV SOLN: INTRAVENOUS | Status: DC

## 2022-04-08 MED ORDER — ZONISAMIDE 100 MG PO CAPS: 300.0000 mg | ORAL_CAPSULE | Freq: Every day | ORAL | Status: DC

## 2022-04-08 MED ORDER — ZONISAMIDE 100 MG PO CAPS: 400.0000 mg | ORAL_CAPSULE | Freq: Every day | ORAL | 0 refills | Status: DC

## 2022-04-08 MED ORDER — ZONISAMIDE 100 MG PO CAPS
400.0000 mg | ORAL_CAPSULE | Freq: Every day | ORAL | Status: DC
  Filled 2022-04-08: qty 4

## 2022-04-08 MED ORDER — METOCLOPRAMIDE HCL 5 MG/ML IJ SOLN
10.0000 mg | Freq: Once | INTRAMUSCULAR | Status: DC
  Filled 2022-04-08: qty 2

## 2022-04-08 MED ORDER — DIPHENHYDRAMINE HCL 25 MG PO CAPS: 25.0000 mg | ORAL_CAPSULE | Freq: Once | ORAL | Status: DC

## 2022-04-08 MED ORDER — LACOSAMIDE 200 MG PO TABS: 200.0000 mg | ORAL_TABLET | Freq: Two times a day (BID) | ORAL | 0 refills | Status: DC

## 2022-04-08 MED ORDER — LACOSAMIDE 50 MG PO TABS: 200.0000 mg | ORAL_TABLET | Freq: Two times a day (BID) | ORAL | Status: DC

## 2022-04-08 NOTE — Progress Notes (Signed)
OT Cancellation Note  Patient Details Name: Perry Norton MRN: [id number] DOB: 1976/05/26   Cancelled Treatment:    Reason Eval/Treat Not Completed: OT screened, no needs identified, will sign off.  Advised by evaluating PT, no OT needs exist.    Ivania Teagarden D Jaycub Noorani 04/08/2022, 1:08 PM 04/08/2022  RP, OTR/L  Acute Rehabilitation Services  Office:  [phone]
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
## 2022-04-08 NOTE — ED Notes (Signed)
Patient sitting on the end of the bed clapping his hands. Call bell in reach, door open, items within reach. Patient reports no complaints at this time

## 2022-04-08 NOTE — Progress Notes (Signed)
PROGRESS NOTE        PATIENT DETAILS Name: Perry Norton Age: 46 y.o. Sex: male Date of Birth: 11/02/1975 Admit Date: 04/06/2022 Admitting Physician Therisa Doyne, MD PCP:Pcp, No  Brief Summary: Patient is a 46 y.o.  male with HIV, seizure disorder-presented with breakthrough seizures.  Breakthrough seizure suspected due to degree of noncompliance with meds.  Significant events: 10/16>> admit to [facility] for breakthrough seizures.  Significant studies: 10/16>> CT head: No acute intracranial abnormality 10/16>> CT abdomen/pelvis: Normal CT abdomen/pelvis.  Significant microbiology data: None  Procedures: LTM EEG 10/17 >  Consults: Neurology  Subjective: Not reviewed, overnight events appreciated.  Behavioral issues and uncooperative with several aspects of his care.  When I walked into patient's ED room, flat affect, on asking his name, pointed to the board next to his bed with his name written on it, had to repeat questions and got one-word answers.  Subsequently reported that he had 4 seizures yesterday and 1 this morning (not confirmed by RN).  Asking when the EEG leads will be removed and states that they have been up for more than 24 hours.  Also claims compliance with his meds.  On touching his abdomen, some wincing with pain and reported abdominal pain, nausea and vomiting.  Objective: Vitals: Vitals:   04/07/22 1640 04/07/22 2158 04/08/22 0312 04/08/22 0635  BP: (!) 131/92 129/80 (!) 134/90   Pulse:  70 80   Resp:  14 17   Temp:  98.3 F (36.8 C)  98.5 F (36.9 C)  TempSrc:    Oral  SpO2:  98% 98%   Weight:      Height:         Exam: Gen Exam:not in any distress.  Young male, moderately built and nourished lying comfortably propped up in bed without distress.  Has dreadlocks.  EEG leads on scalp. HEENT:atraumatic, normocephalic Chest: B/L clear to auscultation anteriorly CVS:S1S2 regular.  No murmurs, JVD or pedal edema.  Not on  telemetry. Abdomen:soft non tender, non distended Extremities:no edema Neurology: Non focal.  Alert and appears oriented.  No focal neurological deficits. Skin: no rash Psychiatric: Judgment and insight: Unable to assess because patient will not engage in discussions.  Mood: Flat affect.  Pertinent Labs/Radiology:    Latest Ref Rng & Units 04/06/2022    6:37 PM 04/06/2022    5:31 PM 02/11/2022   12:04 PM  CBC  WBC 4.0 - 10.5 K/uL  3.3  5.8   Hemoglobin 13.0 - 17.0 g/dL 96.2  22.9  79.8   Hematocrit 39.0 - 52.0 % 46.0  47.6  42.0   Platelets 150 - 400 K/uL  161  207     Lab Results  Component Value Date   NA 139 04/07/2022   K 3.6 04/07/2022   CL 105 04/07/2022   CO2 25 04/07/2022     Assessment/Plan: Epilepsy with breakthrough seizures Remains on Keppra/Zonegran-has been started on Dilantin. Seizure-free this morning, as per discussion with RN but patient reports having seizure x1 this morning. Acknowledges compliance with medications.  Keppra level low therapeutic/16.8.  Lacosamide level low, <0.5.  Suspecting some degree of medication noncompliance. Has been very uncooperative with the nursing staff-threatening to leave AMA on 10th/17. Discussed with neurologist-Dr. Ilda Basset that he may have been noncompliant, recommends that if patient leaves AGAINST MEDICAL ADVICE-to continue Keppra/Zonegran-and not to continue Dilantin  due to possible interaction with his ART's. If patient remains hospitalized-neurology planning to do LTM EEG, ongoing. Patient aware of driving restrictions/seizure restrictions.  He is aware that he needs to remain hospitalized to ensure safe discharge. Has not received any of his IV Dilantin doses or Zonegran in the last 24 hours.  Has only received a dose of IV Keppra yesterday morning. Discussed with Dr. Amada Jupiter, will review EEG and patient later today.   Rhabdomyolysis Mild Due to breakthrough seizures Gently hydrate if he allows-but  has been uncooperative and pulled out his IV lines. CK has increased from 6,543 > 7,314.  Reports some bilateral thigh pain.  Recommend IV fluids if he will . check CK in AM.  Acute kidney injury: Presented with creatinine of 1.66 which has gradually improved to 1.26.  Could be related to rhabdomyolysis.  HIV ED for count 653 (on 8/23)  continue ART Outpatient follow-up with ID.  Reported abdominal pain, nausea and vomiting: CT abdomen and pelvis without acute findings.  Supportive treatment and follow.  Substance abuse/THC: Cessation counseled.  BMI: Estimated body mass index is 25.09 kg/m as calculated from the following:   Height as of this encounter: 6' (1.829 m).   Weight as of this encounter: 83.9 kg.   Code status:   Code Status: Full Code   DVT Prophylaxis: SCDs Start: 04/07/22 0332   Family Communication:  None at bedside   Disposition Plan: Status is: Inpatient Remains inpatient appropriate because: Breakthrough seizures.   Planned Discharge Destination:Home   Diet: Diet Order             Diet regular Room service appropriate? Yes; Fluid consistency: Thin  Diet effective now                     Antimicrobial agents: Anti-infectives (From admission, onward)    Start     Dose/Rate Route Frequency Ordered Stop   04/07/22 2200  bictegravir-emtricitabine-tenofovir AF (BIKTARVY) 50-200-25 MG per tablet 1 tablet        1 tablet Oral Daily at bedtime 04/07/22 0332          MEDICATIONS: Scheduled Meds:  bictegravir-emtricitabine-tenofovir AF  1 tablet Oral QHS   phenytoin (DILANTIN) IV  100 mg Intravenous Q8H   zonisamide  300 mg Oral QHS   Continuous Infusions:  levETIRAcetam Stopped (04/07/22 0833)   PRN Meds:.acetaminophen **OR** acetaminophen, HYDROcodone-acetaminophen, LORazepam   I have personally reviewed following labs and imaging studies  LABORATORY DATA: CBC: Recent Labs  Lab 04/06/22 1731 04/06/22 1837  WBC 3.3*  --    NEUTROABS 1.6*  --   HGB 15.8 15.6  HCT 47.6 46.0  MCV 89.5  --   PLT 161  --     Basic Metabolic Panel: Recent Labs  Lab 04/06/22 1830 04/06/22 1837 04/06/22 2338 04/07/22 1710  NA 138 140  --  139  K 3.6 4.0  --  3.6  CL 103 101  --  105  CO2 25  --   --  25  GLUCOSE 105* 98  --  136*  BUN 14 15  --  8  CREATININE 1.66* 1.50*  --  1.26*  CALCIUM 8.3*  --   --  8.7*  MG 2.2  --   --  2.0  PHOS  --   --  2.9 2.9    GFR: Estimated Creatinine Clearance: 82.1 mL/min (A) (by C-G formula based on SCr of 1.26 mg/dL (H)).  Liver Function Tests: Recent  Labs  Lab 04/06/22 1830 04/07/22 1710  AST 55* 68*  ALT 26 30  ALKPHOS 66 59  BILITOT 0.6 0.4  PROT 7.3 6.5  ALBUMIN 4.2 3.9   Recent Labs  Lab 04/06/22 2112  LIPASE 27   No results for input(s): "AMMONIA" in the last 168 hours.  Coagulation Profile: Recent Labs  Lab 04/06/22 2338  INR 1.1    Cardiac Enzymes: Recent Labs  Lab 04/06/22 2338 04/07/22 1710  CKTOTAL 6,543* 7,314*    BNP (last 3 results) No results for input(s): "PROBNP" in the last 8760 hours.  Lipid Profile: No results for input(s): "CHOL", "HDL", "LDLCALC", "TRIG", "CHOLHDL", "LDLDIRECT" in the last 72 hours.  Thyroid Function Tests: Recent Labs    04/06/22 2338  TSH 4.382    Anemia Panel: No results for input(s): "VITAMINB12", "FOLATE", "FERRITIN", "TIBC", "IRON", "RETICCTPCT" in the last 72 hours.  Urine analysis:    Component Value Date/Time   COLORURINE YELLOW 04/06/2022 1611   APPEARANCEUR CLEAR 04/06/2022 1611   LABSPEC 1.021 04/06/2022 1611   PHURINE 6.0 04/06/2022 1611   GLUCOSEU NEGATIVE 04/06/2022 1611   HGBUR NEGATIVE 04/06/2022 1611   BILIRUBINUR NEGATIVE 04/06/2022 1611   KETONESUR 20 (A) 04/06/2022 1611   PROTEINUR NEGATIVE 04/06/2022 1611   NITRITE NEGATIVE 04/06/2022 1611   LEUKOCYTESUR NEGATIVE 04/06/2022 1611    Sepsis Labs: Lactic Acid, Venous    Component Value Date/Time   LATICACIDVEN 0.98  12/15/2017 1647    MICROBIOLOGY: No results found for this or any previous visit (from the past 240 hour(s)).  RADIOLOGY STUDIES/RESULTS: CT ABDOMEN PELVIS W CONTRAST  Result Date: 04/06/2022 CLINICAL DATA:  Right lower quadrant abdominal pain, multiple seizures EXAM: CT ABDOMEN AND PELVIS WITH CONTRAST TECHNIQUE: Multidetector CT imaging of the abdomen and pelvis was performed using the standard protocol following bolus administration of intravenous contrast. RADIATION DOSE REDUCTION: This exam was performed according to the departmental dose-optimization program which includes automated exposure control, adjustment of the mA and/or kV according to patient size and/or use of iterative reconstruction technique. CONTRAST:  25mL OMNIPAQUE IOHEXOL 350 MG/ML SOLN COMPARISON:  None Available. FINDINGS: Lower chest: Lung bases are clear. Hepatobiliary: Liver is within normal limits. Gallbladder unremarkable. No intrahepatic or extrahepatic duct dilatation. Pancreas: Within normal limits. Spleen: Within normal limits Adrenals/Urinary Tract: Adrenal glands are within normal limits Kidneys are normal limits.  No hydronephrosis. Bladder is within normal limits. Stomach/Bowel: Stomach is within normal limits. No evidence of bowel obstruction. Normal appendix (series 3/image 64). No colonic wall thickening or inflammatory changes. Vascular/Lymphatic: No evidence of abdominal aortic aneurysm. No suspicious abdominopelvic lymphadenopathy. Reproductive: Prostate is unremarkable. Other: No abdominopelvic ascites. Musculoskeletal: Visualized osseous structures are within normal limits. IMPRESSION: Normal CT abdomen/pelvis. Electronically Signed   By: Julian Hy M.D.   On: 04/06/2022 22:11   CT Head Wo Contrast  Result Date: 04/06/2022 CLINICAL DATA:  Possible seizure. EXAM: CT HEAD WITHOUT CONTRAST TECHNIQUE: Contiguous axial images were obtained from the base of the skull through the vertex without intravenous  contrast. RADIATION DOSE REDUCTION: This exam was performed according to the departmental dose-optimization program which includes automated exposure control, adjustment of the mA and/or kV according to patient size and/or use of iterative reconstruction technique. COMPARISON:  06/03/2021 FINDINGS: Brain: Normal appearing cerebral hemispheres and posterior fossa structures. Normal size and position of the ventricles. No intracranial hemorrhage, mass lesion or CT evidence of acute infarction. Vascular: No hyperdense vessel or unexpected calcification. Skull: Normal. Negative for fracture or focal lesion. Sinuses/Orbits:  Unremarkable. Other: None. IMPRESSION: Normal examination. Electronically Signed   By: Beckie Salts M.D.   On: 04/06/2022 19:15     LOS: 1 day   Marcellus Scott, MD,  FACP, [facility], [facility], [facility], [facility]   [facility] [place]     To contact the attending provider between 7A-7P or the covering provider during after hours 7P-7A, please log into the web site [email or web address] and access using universal [place] password for that web site. If you do not have the password, please call the hospital operator.    To contact the attending provider between 7A-7P or the covering provider during after hours 7P-7A, please log into the web site [email or web address] and access using universal [place] password for that web site. If you do not have the password, please call the hospital operator.  04/08/2022, 8:25 AM

## 2022-04-08 NOTE — ED Notes (Addendum)
Provider dr Bridgett Larsson notified of the patient's request for Ambien and reports understanding that the patient has refused all of his medications tonight.

## 2022-04-08 NOTE — Progress Notes (Signed)
vLTM discontinued.  No skin breakdown noted at all skin sites 

## 2022-04-08 NOTE — ED Notes (Addendum)
Refused for temp to be checked 

## 2022-04-08 NOTE — ED Notes (Signed)
Despite best efforts to provide education, patient is standing on the side of the bed shaking the bed rails
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
## 2022-04-08 NOTE — ED Notes (Signed)
Pt still refuses to speak or allow any interventions to be done. Doctor was told via secure chat.

## 2022-04-08 NOTE — ED Notes (Signed)
Pt left AMA while waiting for his paperwork. Pt shook my hand while leaving stating "its all good".  I tried to convince him again to stay but he was unwilling to stay due to his wife waiting for him in the parking lot for too long.

## 2022-04-08 NOTE — ED Notes (Signed)
Pt still refuses to speak with me. Neuro removed the eeg from his head

## 2022-04-08 NOTE — ED Notes (Signed)
I spoke with the pt regarding his treatment when I came on shift. Pt states he is frustrated with his care and does not wish to be evaluated. I explained the possible risks of refusing up to and including death if he does not get the proper treatment. Pt states he just wants a few minutes to think about his treatment plan moving forward. Pt still refuses all vital signs/IV/Medications at this time. I will try again to speak with him.

## 2022-04-08 NOTE — ED Notes (Signed)
Patient reports that he would like something for a headache/ pain and refuses all of his PRN medications which he was educated on. Patient reports that he would like Ambien. This RN to notify provider

## 2022-04-08 NOTE — ED Notes (Signed)
Pt refuses to speak with me now regarding his care and picked up a phone to call someone. I tried asking multiple times if he would like care or how I could help moving forward. Pt stated repeatedly in front of an Neuro doctor that he is leaving and wants the cables off his head. Neurology states they will take care of speaking with the doctor regarding this situation.

## 2022-04-08 NOTE — ED Notes (Signed)
Pt states he would like his medication but states he will not take anything IV. I asked if we could continue to treat him going forward and he immediately rolled his eyes and stopped talking to me altogether again and rolled over in bed.

## 2022-04-15 ENCOUNTER — Other Ambulatory Visit (HOSPITAL_COMMUNITY): Payer: Self-pay

## 2022-04-15 DIAGNOSIS — Z09 Encounter for follow-up examination after completed treatment for conditions other than malignant neoplasm: Secondary | ICD-10-CM | POA: Diagnosis not present

## 2022-04-15 DIAGNOSIS — N179 Acute kidney failure, unspecified: Secondary | ICD-10-CM | POA: Diagnosis not present

## 2022-04-15 DIAGNOSIS — M6282 Rhabdomyolysis: Secondary | ICD-10-CM | POA: Diagnosis not present

## 2022-04-15 DIAGNOSIS — R569 Unspecified convulsions: Secondary | ICD-10-CM | POA: Diagnosis not present

## 2022-04-15 HISTORY — DX: Encounter for follow-up examination after completed treatment for conditions other than malignant neoplasm: Z09

## 2022-04-16 DIAGNOSIS — G9520 Unspecified cord compression: Secondary | ICD-10-CM | POA: Diagnosis not present

## 2022-04-16 DIAGNOSIS — G40219 Localization-related (focal) (partial) symptomatic epilepsy and epileptic syndromes with complex partial seizures, intractable, without status epilepticus: Secondary | ICD-10-CM | POA: Diagnosis not present

## 2022-04-27 ENCOUNTER — Other Ambulatory Visit: Payer: Self-pay

## 2022-04-27 DIAGNOSIS — B20 Human immunodeficiency virus [HIV] disease: Secondary | ICD-10-CM

## 2022-04-27 MED ORDER — BIKTARVY 50-200-25 MG PO TABS: 1.0000 | ORAL_TABLET | Freq: Every day | ORAL | 5 refills | Status: DC

## 2022-05-04 ENCOUNTER — Ambulatory Visit (INDEPENDENT_AMBULATORY_CARE_PROVIDER_SITE_OTHER): Payer: Medicaid Other | Admitting: Podiatry

## 2022-05-04 DIAGNOSIS — M216X2 Other acquired deformities of left foot: Secondary | ICD-10-CM | POA: Diagnosis not present

## 2022-05-04 DIAGNOSIS — M216X1 Other acquired deformities of right foot: Secondary | ICD-10-CM | POA: Diagnosis not present

## 2022-05-04 NOTE — Patient Instructions (Signed)
For inserts I like POWERSTEPS, SUPERFEET, AETREX 

## 2022-05-06 NOTE — Progress Notes (Signed)
Subjective:   Patient ID: Perry Norton, male   DOB: 46 y.o.   MRN: [id number]   HPI Chief Complaint  Patient presents with   Foot Pain    Patient came in today for bilateral foot pain, and callus, patient states that he is having the same amount of pain, rate of pain 10 out of 10, while walking, pins and needles     46 year old male presents the office today with above complaints.  States he still describes the pain is mostly to the calluses.  He states that he ran 6 miles in the morning as well as at night.  No parasites inserts were helpful at first but they were out quickly.  It seems the majority of his pain is on the calluses.  No recent injury or changes otherwise since I saw him last.    Review of Systems  All other systems reviewed and are negative.     Objective:  Physical Exam  General: AAO x3, NAD  Dermatological: Hyperkeratotic lesions noted submetatarsal 1, 3, 5 bilaterally.  No underlying ulceration drainage or signs of infection.  No open lesions.  Vascular: Dorsalis Pedis artery and Posterior Tibial artery pedal pulses are 2/4 bilateral with immedate capillary fill time. There is no pain with calf compression, swelling, warmth, erythema.   Neruologic: Grossly intact via light touch bilateral.  Negative Tinel sign.  Musculoskeletal: Prominent metatarsal heads.  Tenderness the hyperkeratotic lesions prior to debridement.  Muscular strength 5/5 in all groups tested bilateral.  Gait: Unassisted, Nonantalgic.       Assessment:   Hyperkeratotic lesions due to prominent metatarsal heads     Plan:  -Treatment options discussed including all alternatives, risks, and complications. -Etiology of symptoms were discussed -As a courtesy I again debrided all the callus without any complications or bleeding.  Recommend moisturizer and offloading.  Discussed shoes and good arch support.  Discussed different types of inserts we can wear to help offload.  I did try to keep him  active and let him do the running although I think that the amount of activities was causing the calluses.  He gets some pins and needle sensation that seems to localize on the callus of the ankle is a systemic neuropathy or entrapment at this time  Vivi Barrack DPM

## 2022-05-11 DIAGNOSIS — H5213 Myopia, bilateral: Secondary | ICD-10-CM | POA: Diagnosis not present

## 2022-05-29 DIAGNOSIS — G40219 Localization-related (focal) (partial) symptomatic epilepsy and epileptic syndromes with complex partial seizures, intractable, without status epilepticus: Secondary | ICD-10-CM | POA: Diagnosis not present

## 2022-06-10 DIAGNOSIS — I10 Essential (primary) hypertension: Secondary | ICD-10-CM | POA: Diagnosis not present

## 2022-06-10 DIAGNOSIS — B20 Human immunodeficiency virus [HIV] disease: Secondary | ICD-10-CM | POA: Diagnosis not present

## 2022-06-10 DIAGNOSIS — M5412 Radiculopathy, cervical region: Secondary | ICD-10-CM | POA: Diagnosis not present

## 2022-06-10 DIAGNOSIS — R569 Unspecified convulsions: Secondary | ICD-10-CM | POA: Diagnosis not present

## 2022-06-10 DIAGNOSIS — F4323 Adjustment disorder with mixed anxiety and depressed mood: Secondary | ICD-10-CM | POA: Diagnosis not present

## 2022-06-29 ENCOUNTER — Ambulatory Visit: Payer: Medicaid Other | Attending: Physical Therapy | Admitting: Physical Therapy

## 2022-06-29 DIAGNOSIS — M6281 Muscle weakness (generalized): Secondary | ICD-10-CM | POA: Insufficient documentation

## 2022-06-29 DIAGNOSIS — M5412 Radiculopathy, cervical region: Secondary | ICD-10-CM | POA: Insufficient documentation
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
## 2022-07-01 DIAGNOSIS — G40219 Localization-related (focal) (partial) symptomatic epilepsy and epileptic syndromes with complex partial seizures, intractable, without status epilepticus: Secondary | ICD-10-CM | POA: Diagnosis not present

## 2022-07-01 DIAGNOSIS — F321 Major depressive disorder, single episode, moderate: Secondary | ICD-10-CM | POA: Diagnosis not present

## 2022-07-03 ENCOUNTER — Other Ambulatory Visit (HOSPITAL_COMMUNITY): Payer: Self-pay

## 2022-07-06 ENCOUNTER — Telehealth: Payer: Self-pay | Admitting: Pharmacist

## 2022-07-06 NOTE — Telephone Encounter (Signed)
error 
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
## 2022-07-09 ENCOUNTER — Ambulatory Visit: Payer: Medicaid Other

## 2022-07-09 ENCOUNTER — Other Ambulatory Visit: Payer: Self-pay

## 2022-07-09 DIAGNOSIS — M6281 Muscle weakness (generalized): Secondary | ICD-10-CM | POA: Diagnosis present

## 2022-07-09 DIAGNOSIS — M5412 Radiculopathy, cervical region: Secondary | ICD-10-CM | POA: Diagnosis not present

## 2022-07-09 NOTE — Patient Instructions (Signed)
Aquatic Therapy at [facility]-  What to Expect! ? ?Where:  ? ?[facility] ?[address] [facility] ?[place], [place] [address] ?Rehab phone [phone] ? ?NOTE:  You will receive an automated phone message reminding you of your appt and it will say the appointment is at the [address] [facility] [address] [facility] clinic. ? ?        ?How to Prepare: ?Please make sure you drink 8 ounces of water about one hour prior to your pool session ?A caregiver may attend if needed with the patient to help assist as needed. A caregiver can sit in the pool room on chair. ?Please arrive IN YOUR SUIT and 15 minutes prior to your appointment - this helps to avoid delays in starting your session. ?Please make sure to attend to any toileting needs prior to entering the pool ?Locker rooms for changing are provided.   There is direct access to the pool deck form the locker room.  You can lock your belongings in a locker with lock provided. ?Once on the pool deck your therapist will ask if you have signed the Patient  Consent and Assignment of Benefits form before beginning treatment ?Your therapist may take your blood pressure prior to, during and after your session if indicated ?We usually try and create a home exercise program based on activities we do in the pool.  Please be thinking about who might be able to assist you in the pool should you need to participate in an aquatic home exercise program at the time of discharge if you need assistance.  Some patients do not want to or do not have the ability to participate in an aquatic home program - this is not a barrier in any way to you participating in aquatic therapy as part of your current therapy plan! ?After Discharge from PT, you can continue using home program at  the [facility]/, there is a drop-in fee for $5 ($45 a month)or for 60 years  or older $4.00 ($40 a month for seniors ) or any local [employer] pool.  Memberships for purchase are  available for gym/pool at [facility] ? ?IT IS VERY IMPORTANT THAT YOUR LAST VISIT BE IN THE CLINIC AT [address] AFTER YOUR LAST AQUATIC VISIT.  PLEASE MAKE SURE THAT YOU HAVE A [address]  APPOINTMENT SCHEDULED. ? ? ?About the pool: ?Pool is located approximately 500 FT from the entrance of the building.  ?Please bring a support person if you need assistance traveling this      distance.  ? ?Your therapist will assist you in entering the water; there are two ways to     ?      enter: stairs with railings, and a mechanical lift. Your therapist will determine the most appropriate way for you. ? ?Water temperature is usually between 88-90 degrees ? ?There may be up to 2 other swimmers in the pool at the same time ? ?The pool deck is tile, please wear shoes with good traction if you prefer not to be barefoot.  ? ? ?Contact Info:  ?For appointment scheduling and cancellations:         Please call the [facility]  PH:[phone]              Aquatic Therapy  ?[facility]       All sessions are 45 minutes ? ? ? ? ?        ? ?                ?                      ?

## 2022-07-17 DIAGNOSIS — F321 Major depressive disorder, single episode, moderate: Secondary | ICD-10-CM | POA: Diagnosis not present

## 2022-07-17 DIAGNOSIS — G40219 Localization-related (focal) (partial) symptomatic epilepsy and epileptic syndromes with complex partial seizures, intractable, without status epilepticus: Secondary | ICD-10-CM | POA: Diagnosis not present

## 2022-07-22 ENCOUNTER — Ambulatory Visit: Payer: Medicaid Other
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
## 2022-07-24 ENCOUNTER — Telehealth: Payer: Self-pay

## 2022-07-24 ENCOUNTER — Ambulatory Visit: Payer: Medicaid Other | Attending: Neurosurgery

## 2022-07-24 DIAGNOSIS — M6281 Muscle weakness (generalized): Secondary | ICD-10-CM | POA: Insufficient documentation

## 2022-07-24 DIAGNOSIS — M5412 Radiculopathy, cervical region: Secondary | ICD-10-CM | POA: Insufficient documentation

## 2022-07-24 NOTE — Telephone Encounter (Signed)
LVM regarding missed aquatic PT session. Confirmed next appointment time on [place] and given attendance policy information.   1st no-show  Margarette [place], [place] 07/24/22 1:38 PM

## 2022-07-28 ENCOUNTER — Ambulatory Visit: Payer: Medicaid Other | Admitting: Physical Therapy

## 2022-07-28 ENCOUNTER — Encounter: Payer: Self-pay | Admitting: Physical Therapy

## 2022-07-28 ENCOUNTER — Other Ambulatory Visit: Payer: Self-pay

## 2022-07-28 ENCOUNTER — Other Ambulatory Visit (HOSPITAL_COMMUNITY)
Admission: RE | Admit: 2022-07-28 | Discharge: 2022-07-28 | Disposition: A | Discharge status: Home / Self Care | Payer: Medicaid Other | Source: Ambulatory Visit | Attending: Infectious Disease | Admitting: Infectious Disease

## 2022-07-28 ENCOUNTER — Other Ambulatory Visit: Payer: Medicaid Other

## 2022-07-28 DIAGNOSIS — Z113 Encounter for screening for infections with a predominantly sexual mode of transmission: Secondary | ICD-10-CM

## 2022-07-28 DIAGNOSIS — M5412 Radiculopathy, cervical region: Secondary | ICD-10-CM | POA: Diagnosis not present

## 2022-07-28 DIAGNOSIS — M6281 Muscle weakness (generalized): Secondary | ICD-10-CM

## 2022-07-28 DIAGNOSIS — B20 Human immunodeficiency virus [HIV] disease: Secondary | ICD-10-CM

## 2022-07-28 NOTE — Patient Instructions (Signed)
Posture Tips DO: - stand tall and erect - keep chin tucked in - keep head and shoulders in alignment - check posture regularly in mirror or large window - pull head back against headrest in car seat;  Change your position often.  Sit with lumbar support. DON'T: - slouch or slump while watching TV or reading - sit, stand or lie in one position  for too long;  Sitting is especially hard on the spine so if you sit at a desk/use the computer, then stand up often!   Copyright  VHI. All rights reserved.  Posture - Standing   Good posture is important. Avoid slouching and forward head thrust. Maintain curve in low back and align ears over shoul- ders, hips over ankles.  Pull your belly button in toward your back bone. Stand with even weight in feet and ribs lifting up like a golden thread to the sky as shown in clinic   Copyright  VHI. All rights reserved.  Posture - Sitting   Sit upright, head facing forward. Try using a roll to support lower back. Keep shoulders relaxed, and avoid rounded back. Keep hips level with knees. Avoid crossing legs for long periods. Don't cross legs and sit on sit bones and not tail bone.  Be aware of posture throughout day.  You tend to sit in flexed posture with neck forward and sitting on tailbone.   Voncille Lo, PT, [place] Certified Exercise Expert for the Aging Adult  07/28/22 8:22 AM Phone: [phone] Fax: [phone]    Copyright  VHI. All rights reserved.
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
## 2022-07-29 ENCOUNTER — Encounter: Payer: Self-pay | Admitting: Physical Therapy

## 2022-07-29 ENCOUNTER — Ambulatory Visit: Payer: Medicaid Other | Admitting: Physical Therapy

## 2022-07-29 DIAGNOSIS — M6281 Muscle weakness (generalized): Secondary | ICD-10-CM | POA: Diagnosis not present

## 2022-07-29 DIAGNOSIS — M5412 Radiculopathy, cervical region: Secondary | ICD-10-CM | POA: Diagnosis not present

## 2022-07-29 LAB — URINE CYTOLOGY ANCILLARY ONLY
Chlamydia: NEGATIVE
Comment: NEGATIVE
Comment: NORMAL
Neisseria Gonorrhea: NEGATIVE

## 2022-07-29 LAB — T-HELPER CELL (CD4) - (RCID CLINIC ONLY)
CD4 % Helper T Cell: 37 % (ref 33–65)
CD4 T Cell Abs: 707 /uL (ref 400–1790)

## 2022-07-30 LAB — CBC WITH DIFFERENTIAL/PLATELET
Absolute Monocytes: 490 cells/uL (ref 200–950)
Basophils Absolute: 51 cells/uL (ref 0–200)
Basophils Relative: 0.9 %
Eosinophils Absolute: 120 cells/uL (ref 15–500)
Eosinophils Relative: 2.1 %
HCT: 42.6 % (ref 38.5–50.0)
Hemoglobin: 14.6 g/dL (ref 13.2–17.1)
Lymphs Abs: 2155 cells/uL (ref 850–3900)
MCH: 30.5 pg (ref 27.0–33.0)
MCHC: 34.3 g/dL (ref 32.0–36.0)
MCV: 88.9 fL (ref 80.0–100.0)
MPV: 11.3 fL (ref 7.5–12.5)
Monocytes Relative: 8.6 %
Neutro Abs: 2884 cells/uL (ref 1500–7800)
Neutrophils Relative %: 50.6 %
Platelets: 219 10*3/uL (ref 140–400)
RBC: 4.79 10*6/uL (ref 4.20–5.80)
RDW: 13.4 % (ref 11.0–15.0)
Total Lymphocyte: 37.8 %
WBC: 5.7 10*3/uL (ref 3.8–10.8)

## 2022-07-30 LAB — COMPLETE METABOLIC PANEL WITH GFR
AG Ratio: 2.1 (calc) (ref 1.0–2.5)
ALT: 15 U/L (ref 9–46)
AST: 16 U/L (ref 10–40)
Albumin: 4.8 g/dL (ref 3.6–5.1)
Alkaline phosphatase (APISO): 75 U/L (ref 36–130)
BUN/Creatinine Ratio: 7 (calc) (ref 6–22)
BUN: 9 mg/dL (ref 7–25)
CO2: 30 mmol/L (ref 20–32)
Calcium: 9.6 mg/dL (ref 8.6–10.3)
Chloride: 103 mmol/L (ref 98–110)
Creat: 1.32 mg/dL — ABNORMAL HIGH (ref 0.60–1.29)
Globulin: 2.3 g/dL (calc) (ref 1.9–3.7)
Glucose, Bld: 107 mg/dL — ABNORMAL HIGH (ref 65–99)
Potassium: 3.5 mmol/L (ref 3.5–5.3)
Sodium: 141 mmol/L (ref 135–146)
Total Bilirubin: 0.8 mg/dL (ref 0.2–1.2)
Total Protein: 7.1 g/dL (ref 6.1–8.1)
eGFR: 68 mL/min/{1.73_m2} (ref >=60)

## 2022-07-30 LAB — HIV-1 RNA QUANT-NO REFLEX-BLD
HIV 1 RNA Quant: 44 Copies/mL — ABNORMAL HIGH
HIV-1 RNA Quant, Log: 1.64 Log cps/mL — ABNORMAL HIGH

## 2022-07-30 LAB — RPR: RPR Ser Ql: REACTIVE — AB

## 2022-07-30 LAB — RPR TITER: RPR Titer: 1:16 {titer} — ABNORMAL HIGH

## 2022-07-30 LAB — T PALLIDUM AB: T Pallidum Abs: POSITIVE — AB

## 2022-07-31 ENCOUNTER — Ambulatory Visit: Payer: Medicaid Other

## 2022-07-31 DIAGNOSIS — M6281 Muscle weakness (generalized): Secondary | ICD-10-CM | POA: Diagnosis not present

## 2022-07-31 DIAGNOSIS — M5412 Radiculopathy, cervical region: Secondary | ICD-10-CM | POA: Diagnosis not present
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
## 2022-08-03 DIAGNOSIS — M47812 Spondylosis without myelopathy or radiculopathy, cervical region: Secondary | ICD-10-CM | POA: Diagnosis not present

## 2022-08-03 DIAGNOSIS — M5030 Other cervical disc degeneration, unspecified cervical region: Secondary | ICD-10-CM | POA: Diagnosis not present

## 2022-08-03 DIAGNOSIS — G9520 Unspecified cord compression: Secondary | ICD-10-CM | POA: Diagnosis not present

## 2022-08-11 ENCOUNTER — Ambulatory Visit: Payer: Medicaid Other | Admitting: Internal Medicine

## 2022-08-11 ENCOUNTER — Ambulatory Visit: Payer: Medicaid Other | Admitting: Infectious Disease

## 2022-08-11 DIAGNOSIS — G90512 Complex regional pain syndrome I of left upper limb: Secondary | ICD-10-CM | POA: Diagnosis not present

## 2022-08-11 DIAGNOSIS — M5412 Radiculopathy, cervical region: Secondary | ICD-10-CM | POA: Diagnosis not present
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
## 2022-08-12 ENCOUNTER — Encounter: Payer: Self-pay | Admitting: Physical Therapy

## 2022-08-12 ENCOUNTER — Ambulatory Visit: Payer: Medicaid Other | Admitting: Physical Therapy

## 2022-08-12 DIAGNOSIS — M6281 Muscle weakness (generalized): Secondary | ICD-10-CM | POA: Diagnosis not present

## 2022-08-12 DIAGNOSIS — M5412 Radiculopathy, cervical region: Secondary | ICD-10-CM
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
## 2022-08-13 ENCOUNTER — Ambulatory Visit: Payer: Medicaid Other | Admitting: Internal Medicine

## 2022-08-14 ENCOUNTER — Ambulatory Visit: Payer: Medicaid Other

## 2022-08-14 DIAGNOSIS — M6281 Muscle weakness (generalized): Secondary | ICD-10-CM

## 2022-08-14 DIAGNOSIS — M5412 Radiculopathy, cervical region: Secondary | ICD-10-CM

## 2022-08-14 NOTE — Therapy (Signed)
  OUTPATIENT PHYSICAL THERAPY TREATMENT   Patient Name: Perry Norton MRN: [id number] DOB:Sep 17, 1975, 47 y.o., male Today's Date: 08/14/2022  Patient arrived to aquatic PT session and before changing he stated his pain was "very bad" today and that he didn't want to "mess around and find out" with aquatic PT today. He stated he wanted to go back out to his wife and not do therapy today. Patient ambulated out of pool area independently.   Arrived - no charge for today's visits  Margarette Canada, PTA 08/14/22 1:07 PM

## 2022-08-19 ENCOUNTER — Ambulatory Visit: Payer: Medicaid Other

## 2022-08-19 ENCOUNTER — Telehealth: Payer: Self-pay

## 2022-08-19 NOTE — Telephone Encounter (Signed)
TC due to missed visit this date.  VM left reminding patient of next scheduled visit and request to call ahead if he cannot attend.

## 2022-08-21 ENCOUNTER — Telehealth: Payer: Self-pay

## 2022-08-21 ENCOUNTER — Ambulatory Visit: Payer: Medicaid Other | Attending: Neurosurgery

## 2022-08-21 NOTE — Telephone Encounter (Signed)
TC due to missed visit, 3rd no show.  Left VM reminding patient of next appointment and plan to cancels future appointments per attendance policy.  Patient may schedule one visit at a time if he attends next scheduled visit
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
## 2022-08-26 ENCOUNTER — Ambulatory Visit: Payer: Medicaid Other | Admitting: Podiatry

## 2022-08-26 ENCOUNTER — Ambulatory Visit: Payer: Medicaid Other

## 2022-08-26 ENCOUNTER — Telehealth: Payer: Self-pay

## 2022-08-26 DIAGNOSIS — L989 Disorder of the skin and subcutaneous tissue, unspecified: Secondary | ICD-10-CM | POA: Diagnosis not present
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
## 2022-08-26 NOTE — Patient Instructions (Signed)
[employer]

## 2022-08-26 NOTE — Telephone Encounter (Signed)
Pt called for earlier appt w/ Dr. [name] and checking if anything earlier is available. Upon review, Dr. [name] does not have another opening until April 3rd. The original scheduled time at 09/17/2022 will not work for pt and he is needing this appt sooner. Pt has expressed that it is an inconvenience to fly back from [place] just for his appt on 03/28. We have offered an appt w/ another provider previously, but has declined.   He agreed to see Marya Amsler 08/27/2022 at [place] and would only like for his lab work reviewed, but proceeded to ask for the nurses to do so. Instructed pt that he would need to see the provider. Pt rudely asked when our doors open because he will have to wait over 70mns anyway. I did instruct that the appt would be at 9am, but our office opens at 8:15am. Pt continued to push on how long it will be, and informed that his appointment length time will be for 30 min, but continue to ask for what if he is only reviewing labs. I stated that he will be picking up where Dr. VTommy Medalleft off at this time and will completing his 6 mo f/u like we currently have him set for Dr. VTommy Medal   Proceeded to confirm the appt for 08/27/2022 at 9am but pt hung up. Will leave him scheduled at this time.

## 2022-08-27 ENCOUNTER — Encounter: Payer: Self-pay | Admitting: Family

## 2022-08-27 ENCOUNTER — Other Ambulatory Visit: Payer: Self-pay

## 2022-08-27 ENCOUNTER — Ambulatory Visit (INDEPENDENT_AMBULATORY_CARE_PROVIDER_SITE_OTHER): Payer: Medicaid Other | Admitting: Family

## 2022-08-27 VITALS — BP 108/73 | HR 106 | Temp 98.3°F | Wt 173.2 lb

## 2022-08-27 DIAGNOSIS — B20 Human immunodeficiency virus [HIV] disease: Secondary | ICD-10-CM | POA: Diagnosis not present

## 2022-08-27 DIAGNOSIS — Z0000 Encounter for general adult medical examination without abnormal findings: Secondary | ICD-10-CM

## 2022-08-27 DIAGNOSIS — A539 Syphilis, unspecified: Secondary | ICD-10-CM

## 2022-08-27 HISTORY — DX: Encounter for general adult medical examination without abnormal findings: Z00.00

## 2022-08-27 MED ORDER — DOXYCYCLINE HYCLATE 100 MG PO TABS: 100.0000 mg | ORAL_TABLET | Freq: Two times a day (BID) | ORAL | 0 refills | Status: DC

## 2022-08-27 MED ORDER — BIKTARVY 50-200-25 MG PO TABS: 1.0000 | ORAL_TABLET | Freq: Every day | ORAL | 5 refills | Status: DC

## 2022-08-27 NOTE — Assessment & Plan Note (Signed)
Perry Norton continues to have well controlled virus with good adherence and tolerance to [employer]. Reviewed lab work and discussed plan of care. Continue current dose of Biktarvy. Plan for follow up in 6 month or sooner if needed with lab work 1-2 weeks prior to the appointment.

## 2022-08-27 NOTE — Assessment & Plan Note (Signed)
Perry Norton was previously treated with doxycycline with titer of 1:32 just under a year ago with improvement to only 1:16. With sustained RPR titer recommend re-treatment with doxycycline 100 mg bid x 14 days. No current symptoms. Recommend rechecking RPR in 3-6 months.
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
## 2022-08-27 NOTE — Assessment & Plan Note (Signed)
Discussed importance of safe sexual practice and condom use. Condoms and STD testing offered.  Declines vaccinations.

## 2022-08-27 NOTE — Patient Instructions (Addendum)
Nice to see you.  Continue to take your medication daily as prescribed.  Refills have been sent to the pharmacy.  Increase your water intake as able.   Plan for follow up with Dr. [name] in 6 months or sooner if needed with lab work 1-2 weeks prior to appointment.   Have a great day and stay safe!

## 2022-08-28 ENCOUNTER — Ambulatory Visit: Payer: Medicaid Other

## 2022-09-07 DIAGNOSIS — M4802 Spinal stenosis, cervical region: Secondary | ICD-10-CM | POA: Diagnosis not present

## 2022-09-07 DIAGNOSIS — M50021 Cervical disc disorder at C4-C5 level with myelopathy: Secondary | ICD-10-CM | POA: Diagnosis not present

## 2022-09-07 DIAGNOSIS — M50123 Cervical disc disorder at C6-C7 level with radiculopathy: Secondary | ICD-10-CM | POA: Diagnosis not present

## 2022-09-07 DIAGNOSIS — Z01818 Encounter for other preprocedural examination: Secondary | ICD-10-CM | POA: Diagnosis not present

## 2022-09-07 DIAGNOSIS — M47814 Spondylosis without myelopathy or radiculopathy, thoracic region: Secondary | ICD-10-CM | POA: Diagnosis not present

## 2022-09-07 DIAGNOSIS — Z981 Arthrodesis status: Secondary | ICD-10-CM | POA: Diagnosis not present

## 2022-09-07 DIAGNOSIS — M4803 Spinal stenosis, cervicothoracic region: Secondary | ICD-10-CM | POA: Diagnosis not present

## 2022-09-07 DIAGNOSIS — M50121 Cervical disc disorder at C4-C5 level with radiculopathy: Secondary | ICD-10-CM | POA: Diagnosis not present

## 2022-09-17 ENCOUNTER — Ambulatory Visit: Payer: Medicaid Other | Admitting: Infectious Disease

## 2022-11-02 DIAGNOSIS — R569 Unspecified convulsions: Secondary | ICD-10-CM

## 2022-11-02 DIAGNOSIS — G40219 Localization-related (focal) (partial) symptomatic epilepsy and epileptic syndromes with complex partial seizures, intractable, without status epilepticus: Secondary | ICD-10-CM | POA: Diagnosis not present

## 2022-11-02 HISTORY — DX: Unspecified convulsions: R56.9

## 2022-11-18 DIAGNOSIS — G90512 Complex regional pain syndrome I of left upper limb: Secondary | ICD-10-CM | POA: Diagnosis not present

## 2022-11-18 DIAGNOSIS — M5412 Radiculopathy, cervical region: Secondary | ICD-10-CM | POA: Diagnosis not present

## 2022-11-26 ENCOUNTER — Ambulatory Visit: Payer: Medicaid Other | Admitting: Podiatry

## 2022-11-30 ENCOUNTER — Telehealth: Payer: Self-pay

## 2022-11-30 NOTE — Telephone Encounter (Signed)
Patient called stating that his wife wanted to be tested for HIV. she apparently works for another healthcare system locally and doesn't want any testing in her chart.   Per Dr.Van Dam - patient could go to the health dept to be tested.  Left voicemail asking patient to return my call to notify she could go to the health dept to be tested.    Iridian Reader Lesli Albee, CMA

## 2022-11-30 NOTE — Telephone Encounter (Signed)
Patient called back and requested they contact Health Department for testing. Verbalized understanding. Provided him with health department number to call and schedule appt Juanita Laster, RMA

## 2022-12-03 ENCOUNTER — Ambulatory Visit (INDEPENDENT_AMBULATORY_CARE_PROVIDER_SITE_OTHER): Payer: Medicaid Other | Admitting: Podiatry

## 2022-12-03 DIAGNOSIS — D492 Neoplasm of unspecified behavior of bone, soft tissue, and skin: Secondary | ICD-10-CM

## 2022-12-03 NOTE — Progress Notes (Signed)
The 10-year ASCVD risk score (Arnett DK, et al., 2019) is: 11.4%   Values used to calculate the score:     Age: 47 years     Sex: Male     Is Non-Hispanic African American: Yes     Diabetic: No     Tobacco smoker: Yes     Systolic Blood Pressure: 126 mmHg     Is BP treated: Yes     HDL Cholesterol: 44 mg/dL     Total Cholesterol: 189 mg/dL  Sandie Ano, RN

## 2022-12-05 NOTE — Progress Notes (Signed)
Subjective:   Patient ID: Perry Norton, male   DOB: 47 y.o.   MRN: [id number]   HPI Chief Complaint  Patient presents with   Callouses    Callus trim to bilateral feet      47 year old male presents the office today with above complaints.  He has been on his feet a lot while in [place] for the last 6 months and the calluses will come back causing pain.  No open lesions.  No new concerns.   Review of Systems  All other systems reviewed and are negative.     Objective:  Physical Exam  General: AAO x3, NAD  Dermatological: Hyperkeratotic lesions noted submetatarsal 1, 3, 5 bilaterally.  No underlying ulceration drainage or signs of infection.  No open lesions.  Vascular: Dorsalis Pedis artery and Posterior Tibial artery pedal pulses are 2/4 bilateral with immedate capillary fill time. There is no pain with calf compression, swelling, warmth, erythema.   Neruologic: Grossly intact via light touch bilateral.  Negative Tinel sign.  Musculoskeletal: Prominent metatarsal heads.  Tenderness the hyperkeratotic lesions prior to debridement.  Muscular strength 5/5 in all groups tested bilateral.  Gait: Unassisted, Nonantalgic.       Assessment:   Hyperkeratotic lesions due to prominent metatarsal heads     Plan:  -Treatment options discussed including all alternatives, risks, and complications. -Etiology of symptoms were discussed -As a courtesy I again debrided all the callus without any complications or bleeding.  Recommend moisturizer and offloading.  Discussed shoes and good arch support.   Vivi Barrack DPM

## 2023-01-15 DIAGNOSIS — G90512 Complex regional pain syndrome I of left upper limb: Secondary | ICD-10-CM | POA: Diagnosis not present

## 2023-01-15 DIAGNOSIS — M5412 Radiculopathy, cervical region: Secondary | ICD-10-CM | POA: Diagnosis not present

## 2023-01-15 HISTORY — DX: Complex regional pain syndrome i of left upper limb: G90.512

## 2023-01-18 DIAGNOSIS — G40219 Localization-related (focal) (partial) symptomatic epilepsy and epileptic syndromes with complex partial seizures, intractable, without status epilepticus: Secondary | ICD-10-CM | POA: Diagnosis not present

## 2023-01-18 DIAGNOSIS — Z59819 Housing instability, housed unspecified: Secondary | ICD-10-CM | POA: Diagnosis not present

## 2023-01-18 DIAGNOSIS — B20 Human immunodeficiency virus [HIV] disease: Secondary | ICD-10-CM | POA: Diagnosis not present

## 2023-01-18 DIAGNOSIS — R569 Unspecified convulsions: Secondary | ICD-10-CM | POA: Diagnosis not present

## 2023-01-18 DIAGNOSIS — Z79899 Other long term (current) drug therapy: Secondary | ICD-10-CM | POA: Diagnosis not present

## 2023-01-26 ENCOUNTER — Encounter: Payer: Self-pay | Admitting: Student

## 2023-01-26 ENCOUNTER — Ambulatory Visit (INDEPENDENT_AMBULATORY_CARE_PROVIDER_SITE_OTHER): Payer: Medicaid Other | Admitting: Student

## 2023-01-26 VITALS — BP 120/93 | HR 128 | Ht 72.0 in | Wt 170.9 lb

## 2023-01-26 DIAGNOSIS — K921 Melena: Secondary | ICD-10-CM

## 2023-01-26 DIAGNOSIS — F17210 Nicotine dependence, cigarettes, uncomplicated: Secondary | ICD-10-CM

## 2023-01-26 DIAGNOSIS — I10 Essential (primary) hypertension: Secondary | ICD-10-CM

## 2023-01-26 DIAGNOSIS — Z0000 Encounter for general adult medical examination without abnormal findings: Secondary | ICD-10-CM

## 2023-01-26 HISTORY — DX: Melena: K92.1

## 2023-01-26 NOTE — Patient Instructions (Addendum)
Thank you, Perry Norton for allowing [place] to provide your care today. Today we discussed your bleeding and chronic health problems to help you establish care.   I have ordered the following labs for you:  Lab Orders         CBC no Diff      Tests ordered today:  None  Referrals ordered today:   Referral Orders         Ambulatory referral to Gastroenterology       I have ordered the following medication/changed the following medications:   Stop the following medications: There are no discontinued medications.   Start the following medications: No orders of the defined types were placed in this encounter.    Follow up:  - 3 months or sooner as needed   Remember:   - You are being referred to a gastroenterologist to help determine if you need endoscopy/colonoscopy and the type of treatment that is best for your bleeding.  - I will call you to go over the results of your complete blood count and next steps if necessary.   Should you have any questions or concerns please call the internal medicine clinic at [phone].      Perry Coyer, MD PGY-1 Internal Medicine Teaching Progam [facility]
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
## 2023-01-26 NOTE — Assessment & Plan Note (Signed)
Patient presenting with about a six week history of bleeding with bowel movements, which patient has 3-4 times a day. Blood is bright red, possibly indicating a lower GI concern. Patient denied infectious symptoms or weight loss, and appeared HDS on exam. Patient appeared reserved and uncomfortable discussing symptoms.On physical exam, patient did not have overt bleeding or visible anal tear. Did appear to have two small skin tags at the anus, which may be external hemorrhoids. Patient denied history of anoreceptive intercourse, trauma, straining with bowel movements, or hemorrhoids. Patient deferred digital rectal exam. Recommend GI referral for further work up to help rule out internal/external hemorrhoids, proctitis, IBD, or structural problem (e.g., polyp).   Plan:  - CBC, looking at Hgb  - GI referral for colonoscopy/endoscopy/further workup

## 2023-01-26 NOTE — Assessment & Plan Note (Signed)
Discussed need for regular labs and follow up. Patient at age where colonoscopy is recommended. Will revisit smoking cessation counseling, as well as annual vaccines like the flu and COVID vaccines that can help keep the patient healthy, especially given his history of HIV, syphilis, and overall immunosuppression.

## 2023-01-26 NOTE — Assessment & Plan Note (Signed)
Attempted to discuss hypertension and blood pressure medications with patient, but patient did not appear as concerned or interested in discussing at this visit. BP 120/93. Patient possibly not taking amlodipine 5 mg daily.  Plan:  - Monitor at follow up visit to determine if we need to resume blood pressure medication or adjust medication and dose

## 2023-02-03 ENCOUNTER — Encounter: Payer: Self-pay | Admitting: Student

## 2023-02-10 DIAGNOSIS — R569 Unspecified convulsions: Secondary | ICD-10-CM | POA: Diagnosis not present

## 2023-02-10 DIAGNOSIS — G8929 Other chronic pain: Secondary | ICD-10-CM | POA: Diagnosis not present

## 2023-02-10 DIAGNOSIS — R519 Headache, unspecified: Secondary | ICD-10-CM | POA: Diagnosis not present

## 2023-02-15 ENCOUNTER — Other Ambulatory Visit: Payer: Self-pay

## 2023-02-15 ENCOUNTER — Other Ambulatory Visit (HOSPITAL_COMMUNITY)
Admission: RE | Admit: 2023-02-15 | Discharge: 2023-02-15 | Disposition: A | Discharge status: Home / Self Care | Payer: Medicaid Other | Source: Ambulatory Visit | Attending: Infectious Disease | Admitting: Infectious Disease

## 2023-02-15 ENCOUNTER — Encounter: Payer: Self-pay | Admitting: Infectious Disease

## 2023-02-15 ENCOUNTER — Other Ambulatory Visit (HOSPITAL_COMMUNITY): Payer: Self-pay

## 2023-02-15 ENCOUNTER — Other Ambulatory Visit: Payer: Medicaid Other

## 2023-02-15 ENCOUNTER — Ambulatory Visit (INDEPENDENT_AMBULATORY_CARE_PROVIDER_SITE_OTHER): Payer: Medicaid Other | Admitting: Infectious Disease

## 2023-02-15 VITALS — BP 108/76 | HR 98 | Temp 98.1°F | Wt 174.0 lb

## 2023-02-15 DIAGNOSIS — K625 Hemorrhage of anus and rectum: Secondary | ICD-10-CM | POA: Diagnosis not present

## 2023-02-15 DIAGNOSIS — M5412 Radiculopathy, cervical region: Secondary | ICD-10-CM | POA: Diagnosis not present

## 2023-02-15 DIAGNOSIS — Z113 Encounter for screening for infections with a predominantly sexual mode of transmission: Secondary | ICD-10-CM

## 2023-02-15 DIAGNOSIS — G40309 Generalized idiopathic epilepsy and epileptic syndromes, not intractable, without status epilepticus: Secondary | ICD-10-CM

## 2023-02-15 DIAGNOSIS — E785 Hyperlipidemia, unspecified: Secondary | ICD-10-CM

## 2023-02-15 DIAGNOSIS — I10 Essential (primary) hypertension: Secondary | ICD-10-CM | POA: Diagnosis not present

## 2023-02-15 DIAGNOSIS — B20 Human immunodeficiency virus [HIV] disease: Secondary | ICD-10-CM | POA: Diagnosis not present

## 2023-02-15 DIAGNOSIS — M5030 Other cervical disc degeneration, unspecified cervical region: Secondary | ICD-10-CM | POA: Diagnosis not present

## 2023-02-15 DIAGNOSIS — G9520 Unspecified cord compression: Secondary | ICD-10-CM | POA: Diagnosis not present

## 2023-02-15 HISTORY — DX: Hyperlipidemia, unspecified: E78.5

## 2023-02-15 MED ORDER — ROSUVASTATIN CALCIUM 10 MG PO TABS
10.0000 mg | ORAL_TABLET | Freq: Every day | ORAL | 11 refills | Status: DC
  Filled 2023-02-15 – 2023-02-17 (×3): qty 30, 30d supply, fill #0

## 2023-02-15 MED ORDER — BIKTARVY 50-200-25 MG PO TABS
1.0000 | ORAL_TABLET | Freq: Every day | ORAL | 5 refills | Status: DC
  Filled 2023-02-15 – 2023-02-17 (×2): qty 30, 30d supply, fill #0
  Filled 2023-05-20 – 2023-05-21 (×2): qty 30, 30d supply, fill #1

## 2023-02-15 NOTE — Progress Notes (Signed)
Internal Medicine Clinic Attending  I was physically present during the key portions of the resident provided service and participated in the medical decision making of patient's management care. I reviewed pertinent patient test results.  The assessment, diagnosis, and plan were formulated together and I agree with the documentation in the resident's note.  Guilloud, Carolyn, MD  
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
## 2023-02-16 ENCOUNTER — Other Ambulatory Visit (HOSPITAL_COMMUNITY): Payer: Self-pay

## 2023-02-16 ENCOUNTER — Encounter: Payer: Self-pay | Admitting: Physician Assistant

## 2023-02-16 ENCOUNTER — Other Ambulatory Visit: Payer: Medicaid Other

## 2023-02-16 LAB — T-HELPER CELLS (CD4) COUNT (NOT AT ARMC)
CD4 % Helper T Cell: 35 % (ref 33–65)
CD4 T Cell Abs: 517 /uL (ref 400–1790)

## 2023-02-17 ENCOUNTER — Other Ambulatory Visit (HOSPITAL_COMMUNITY): Payer: Self-pay

## 2023-02-17 ENCOUNTER — Other Ambulatory Visit: Payer: Self-pay

## 2023-02-17 ENCOUNTER — Telehealth: Payer: Self-pay | Admitting: Pharmacist

## 2023-02-17 LAB — URINE CYTOLOGY ANCILLARY ONLY
Chlamydia: NEGATIVE
Comment: NEGATIVE
Comment: NORMAL
Neisseria Gonorrhea: NEGATIVE

## 2023-02-17 LAB — CYTOLOGY, (ORAL, ANAL, URETHRAL) ANCILLARY ONLY
Chlamydia: NEGATIVE
Comment: NEGATIVE
Comment: NORMAL
Neisseria Gonorrhea: NEGATIVE

## 2023-02-17 MED ORDER — ROSUVASTATIN CALCIUM 10 MG PO TABS: 10.0000 mg | ORAL_TABLET | Freq: Every day | ORAL | 11 refills | Status: DC

## 2023-02-17 NOTE — Telephone Encounter (Signed)
Called patient to verify address for Biktarvy. [facility] will mail it out to him today. He asked that Crestor be sent to [facility] so will send there now. All questions answered.  Zain Lankford L. Rianne Degraaf, PharmD, BCIDP, AAHIVP, CPP Clinical Pharmacist Practitioner Infectious Diseases Clinical Pharmacist [facility] 02/17/2023, 9:20 AM

## 2023-02-24 LAB — COMPLETE METABOLIC PANEL WITH GFR
AG Ratio: 2.5 (calc) (ref 1.0–2.5)
ALT: 17 U/L (ref 9–46)
AST: 18 U/L (ref 10–40)
Albumin: 4.7 g/dL (ref 3.6–5.1)
Alkaline phosphatase (APISO): 76 U/L (ref 36–130)
BUN: 20 mg/dL (ref 7–25)
CO2: 29 mmol/L (ref 20–32)
Calcium: 9.7 mg/dL (ref 8.6–10.3)
Chloride: 103 mmol/L (ref 98–110)
Creat: 1.22 mg/dL (ref 0.60–1.29)
Globulin: 1.9 g/dL (ref 1.9–3.7)
Glucose, Bld: 108 mg/dL — ABNORMAL HIGH (ref 65–99)
Potassium: 4.3 mmol/L (ref 3.5–5.3)
Sodium: 138 mmol/L (ref 135–146)
Total Bilirubin: 0.4 mg/dL (ref 0.2–1.2)
Total Protein: 6.6 g/dL (ref 6.1–8.1)
eGFR: 75 mL/min/{1.73_m2} (ref >=60)

## 2023-02-24 LAB — RPR TITER: RPR Titer: 1:16 {titer} — ABNORMAL HIGH

## 2023-02-24 LAB — LIPID PANEL
Cholesterol: 168 mg/dL (ref <200)
HDL: 42 mg/dL (ref >=40)
LDL Cholesterol (Calc): 99 mg/dL
Non-HDL Cholesterol (Calc): 126 mg/dL (ref <130)
Total CHOL/HDL Ratio: 4 (calc) (ref <5.0)
Triglycerides: 169 mg/dL — ABNORMAL HIGH (ref <150)

## 2023-02-24 LAB — CBC WITH DIFFERENTIAL/PLATELET
Absolute Monocytes: 410 {cells}/uL (ref 200–950)
Basophils Absolute: 60 {cells}/uL (ref 0–200)
Basophils Relative: 1.2 %
Eosinophils Absolute: 50 {cells}/uL (ref 15–500)
Eosinophils Relative: 1 %
HCT: 40.4 % (ref 38.5–50.0)
Hemoglobin: 13.6 g/dL (ref 13.2–17.1)
Lymphs Abs: 1595 {cells}/uL (ref 850–3900)
MCH: 29.8 pg (ref 27.0–33.0)
MCHC: 33.7 g/dL (ref 32.0–36.0)
MCV: 88.6 fL (ref 80.0–100.0)
MPV: 11.4 fL (ref 7.5–12.5)
Monocytes Relative: 8.2 %
Neutro Abs: 2885 {cells}/uL (ref 1500–7800)
Neutrophils Relative %: 57.7 %
Platelets: 191 10*3/uL (ref 140–400)
RBC: 4.56 10*6/uL (ref 4.20–5.80)
RDW: 13.2 % (ref 11.0–15.0)
Total Lymphocyte: 31.9 %
WBC: 5 10*3/uL (ref 3.8–10.8)

## 2023-02-24 LAB — RPR: RPR Ser Ql: REACTIVE — AB

## 2023-02-24 LAB — T PALLIDUM AB: T Pallidum Abs: POSITIVE — AB

## 2023-02-24 LAB — HIV RNA, RTPCR W/R GT (RTI, PI,INT)
HIV 1 RNA Quant: <20 {copies}/mL — AB
HIV-1 RNA Quant, Log: <1.3 {Log_copies}/mL — AB

## 2023-02-25 ENCOUNTER — Ambulatory Visit: Payer: Medicaid Other | Admitting: Internal Medicine

## 2023-03-02 ENCOUNTER — Ambulatory Visit: Payer: Self-pay | Admitting: Infectious Disease

## 2023-03-03 DIAGNOSIS — Z981 Arthrodesis status: Secondary | ICD-10-CM | POA: Diagnosis not present

## 2023-03-03 DIAGNOSIS — M4802 Spinal stenosis, cervical region: Secondary | ICD-10-CM | POA: Diagnosis not present

## 2023-03-03 DIAGNOSIS — M4803 Spinal stenosis, cervicothoracic region: Secondary | ICD-10-CM | POA: Diagnosis not present

## 2023-03-05 ENCOUNTER — Other Ambulatory Visit (HOSPITAL_COMMUNITY): Payer: Self-pay

## 2023-03-08 ENCOUNTER — Other Ambulatory Visit (HOSPITAL_COMMUNITY): Payer: Self-pay

## 2023-03-10 ENCOUNTER — Other Ambulatory Visit (HOSPITAL_COMMUNITY): Payer: Self-pay

## 2023-03-15 DIAGNOSIS — M5412 Radiculopathy, cervical region: Secondary | ICD-10-CM | POA: Diagnosis not present

## 2023-03-15 DIAGNOSIS — M4802 Spinal stenosis, cervical region: Secondary | ICD-10-CM | POA: Diagnosis not present

## 2023-03-15 DIAGNOSIS — M5030 Other cervical disc degeneration, unspecified cervical region: Secondary | ICD-10-CM | POA: Diagnosis not present

## 2023-03-15 DIAGNOSIS — M47812 Spondylosis without myelopathy or radiculopathy, cervical region: Secondary | ICD-10-CM | POA: Diagnosis not present

## 2023-03-17 ENCOUNTER — Encounter: Payer: Self-pay | Admitting: Internal Medicine

## 2023-03-17 ENCOUNTER — Ambulatory Visit (INDEPENDENT_AMBULATORY_CARE_PROVIDER_SITE_OTHER): Payer: Medicaid Other | Admitting: Internal Medicine

## 2023-03-17 VITALS — BP 130/90 | HR 82 | Ht 72.0 in | Wt 178.8 lb

## 2023-03-17 DIAGNOSIS — K625 Hemorrhage of anus and rectum: Secondary | ICD-10-CM

## 2023-03-17 NOTE — Patient Instructions (Signed)
You have been scheduled for a colonoscopy. Please follow written instructions given to you at your visit today.   Please pick up your prep supplies at the pharmacy within the next 1-3 days.  If you use inhalers (even only as needed), please bring them with you on the day of your procedure.  DO NOT TAKE 7 DAYS PRIOR TO TEST- Trulicity (dulaglutide) Ozempic, Wegovy (semaglutide) Mounjaro (tirzepatide) Bydureon Bcise (exanatide extended release)  DO NOT TAKE 1 DAY PRIOR TO YOUR TEST Rybelsus (semaglutide) Adlyxin (lixisenatide) Victoza (liraglutide) Byetta (exanatide) ___________________________________________________________________________  _______________________________________________________  If your blood pressure at your visit was 140/90 or greater, please contact your primary care physician to follow up on this.  _______________________________________________________  If you are age 64 or older, your body mass index should be between 23-30. Your Body mass index is 24.25 kg/m. If this is out of the aforementioned range listed, please consider follow up with your Primary Care Provider.  If you are age 17 or younger, your body mass index should be between 19-25. Your Body mass index is 24.25 kg/m. If this is out of the aformentioned range listed, please consider follow up with your Primary Care Provider.   ________________________________________________________  The [facility] providers would like to encourage you to use [facility] to communicate with providers for non-urgent requests or questions.  Due to long hold times on the telephone, sending your provider a message by [facility] may be a faster and more efficient way to get a response.  Please allow 48 business hours for a response.  Please remember that this is for non-urgent requests.  _______________________________________________________
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
## 2023-03-19 ENCOUNTER — Other Ambulatory Visit: Payer: Self-pay

## 2023-03-19 ENCOUNTER — Telehealth: Payer: Self-pay | Admitting: Internal Medicine

## 2023-03-19 MED ORDER — NA SULFATE-K SULFATE-MG SULF 17.5-3.13-1.6 GM/177ML PO SOLN: 1.0000 | Freq: Once | ORAL | 0 refills | Status: AC

## 2023-03-19 NOTE — Telephone Encounter (Signed)
Prep resent to [address]

## 2023-03-19 NOTE — Telephone Encounter (Signed)
Patient wanting to update pharmacy to [facility] on [address] for further refills/medications.

## 2023-03-19 NOTE — Telephone Encounter (Signed)
Inbound call from patient needing prep medication sent into the pharmacy. Please advise.   Thank you

## 2023-03-22 ENCOUNTER — Ambulatory Visit: Payer: Medicaid Other | Admitting: Infectious Disease

## 2023-03-25 ENCOUNTER — Ambulatory Visit (INDEPENDENT_AMBULATORY_CARE_PROVIDER_SITE_OTHER): Payer: Medicaid Other | Admitting: Family Medicine

## 2023-03-25 VITALS — BP 141/97 | HR 110 | Ht 72.0 in | Wt 169.0 lb

## 2023-03-25 DIAGNOSIS — B20 Human immunodeficiency virus [HIV] disease: Secondary | ICD-10-CM

## 2023-03-25 DIAGNOSIS — F339 Major depressive disorder, recurrent, unspecified: Secondary | ICD-10-CM

## 2023-03-25 DIAGNOSIS — I10 Essential (primary) hypertension: Secondary | ICD-10-CM

## 2023-03-25 DIAGNOSIS — Z7689 Persons encountering health services in other specified circumstances: Secondary | ICD-10-CM

## 2023-03-25 MED ORDER — AMLODIPINE BESYLATE 5 MG PO TABS: 5.0000 mg | ORAL_TABLET | Freq: Every day | ORAL | 1 refills | Status: DC

## 2023-03-25 MED ORDER — SERTRALINE HCL 50 MG PO TABS: 50.0000 mg | ORAL_TABLET | Freq: Every day | ORAL | 0 refills | Status: DC

## 2023-03-26 ENCOUNTER — Encounter: Payer: Self-pay | Admitting: Family Medicine
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
## 2023-03-26 NOTE — Telephone Encounter (Signed)
Patient needing assistance with prep instructions. Please advise.   Thank you

## 2023-03-29 ENCOUNTER — Telehealth: Payer: Self-pay

## 2023-03-29 NOTE — Telephone Encounter (Signed)
Pt called to clarify how to drink his prep - we reiterated that the first half is at 6pm the night before and the second half would be 10:00am the morning of the procedure.  Patient understood.

## 2023-03-29 NOTE — Telephone Encounter (Signed)
Lm on vm 

## 2023-03-31 DIAGNOSIS — F4321 Adjustment disorder with depressed mood: Secondary | ICD-10-CM | POA: Diagnosis not present

## 2023-04-01 DIAGNOSIS — B20 Human immunodeficiency virus [HIV] disease: Secondary | ICD-10-CM | POA: Diagnosis not present

## 2023-04-01 DIAGNOSIS — M5010 Cervical disc disorder with radiculopathy, unspecified cervical region: Secondary | ICD-10-CM | POA: Diagnosis not present

## 2023-04-01 DIAGNOSIS — I10 Essential (primary) hypertension: Secondary | ICD-10-CM | POA: Diagnosis not present

## 2023-04-01 DIAGNOSIS — F329 Major depressive disorder, single episode, unspecified: Secondary | ICD-10-CM | POA: Diagnosis not present

## 2023-04-01 DIAGNOSIS — M5412 Radiculopathy, cervical region: Secondary | ICD-10-CM | POA: Diagnosis not present

## 2023-04-01 DIAGNOSIS — M4802 Spinal stenosis, cervical region: Secondary | ICD-10-CM | POA: Diagnosis not present

## 2023-04-05 DIAGNOSIS — M7918 Myalgia, other site: Secondary | ICD-10-CM | POA: Diagnosis not present

## 2023-04-05 DIAGNOSIS — M542 Cervicalgia: Secondary | ICD-10-CM | POA: Diagnosis not present

## 2023-04-14 ENCOUNTER — Telehealth: Payer: Self-pay | Admitting: Internal Medicine

## 2023-04-14 DIAGNOSIS — R454 Irritability and anger: Secondary | ICD-10-CM | POA: Diagnosis not present

## 2023-04-14 DIAGNOSIS — F4321 Adjustment disorder with depressed mood: Secondary | ICD-10-CM | POA: Diagnosis not present

## 2023-04-14 NOTE — Telephone Encounter (Signed)
Inbound call from patient, would like a nurse to go over procedure instructions with him. States he does not have access to MyChart and does not have a paper copy. Procedure is scheduled for 10/25 at 3:00 PM.

## 2023-04-14 NOTE — Telephone Encounter (Signed)
Lm on vm regarding instructions - offered to print another copy of them for him to pick up or go over them on the phone

## 2023-04-15 ENCOUNTER — Telehealth: Payer: Self-pay

## 2023-04-15 NOTE — Telephone Encounter (Signed)
Spoke to patient and reviewed his instructions with him.  He was very concerned about being on clear liquids and upset that he couldn't eat solid food.  He stated he would "rather put a 357 magnum in my mouth than do that."  I gave him several options for liquids like jello and broth "I don't even know what the fuck broth is."  Finally I told him I needed to let a patient go and did he have any more questions  At this point he stopped responding to anything I said so I hung up.

## 2023-04-16 ENCOUNTER — Encounter: Payer: Self-pay | Admitting: Internal Medicine

## 2023-04-16 ENCOUNTER — Ambulatory Visit (AMBULATORY_SURGERY_CENTER): Payer: Medicaid Other | Admitting: Internal Medicine

## 2023-04-16 VITALS — BP 97/67 | HR 72 | Temp 97.8°F | Resp 14 | Ht 72.0 in | Wt 178.0 lb

## 2023-04-16 DIAGNOSIS — K648 Other hemorrhoids: Secondary | ICD-10-CM

## 2023-04-16 DIAGNOSIS — E785 Hyperlipidemia, unspecified: Secondary | ICD-10-CM | POA: Diagnosis not present

## 2023-04-16 DIAGNOSIS — I10 Essential (primary) hypertension: Secondary | ICD-10-CM | POA: Diagnosis not present

## 2023-04-16 DIAGNOSIS — K625 Hemorrhage of anus and rectum: Secondary | ICD-10-CM | POA: Diagnosis not present

## 2023-04-16 MED ORDER — SODIUM CHLORIDE 0.9 % IV SOLN: 500.0000 mL | Freq: Once | INTRAVENOUS | Status: DC

## 2023-04-16 MED ORDER — HYDROCORTISONE ACETATE 25 MG RE SUPP
25.0000 mg | Freq: Every day | RECTAL | 3 refills | Status: DC | PRN
Start: 2023-04-16 — End: 2023-05-25

## 2023-04-16 NOTE — Progress Notes (Unsigned)
Report to PACU, RN, vss, BBS= Clear.  

## 2023-04-16 NOTE — Op Note (Signed)
[facility] Patient Name: Perry Norton Procedure Date: 04/16/2023 2:32 PM MRN: [id number] Endoscopist: Wilhemina Bonito. Marina Goodell , MD, [id number] Age: 47 Referring MD:  Date of Birth: July 25, 1975 Gender: Male Account #: [id number] Procedure:                Colonoscopy Indications:              Rectal bleeding Medicines:                Monitored Anesthesia Care Procedure:                Pre-Anesthesia Assessment:                           - Prior to the procedure, a History and Physical                            was performed, and patient medications and                            allergies were reviewed. The patient's tolerance of                            previous anesthesia was also reviewed. The risks                            and benefits of the procedure and the sedation                            options and risks were discussed with the patient.                            All questions were answered, and informed consent                            was obtained. Prior Anticoagulants: The patient has                            taken no anticoagulant or antiplatelet agents. ASA                            Grade Assessment: II - A patient with mild systemic                            disease. After reviewing the risks and benefits,                            the patient was deemed in satisfactory condition to                            undergo the procedure.                           After obtaining informed consent, the colonoscope  was passed under direct vision. Throughout the                            procedure, the patient's blood pressure, pulse, and                            oxygen saturations were monitored continuously. The                            CF [id number] #[id number] was introduced through the anus                            and advanced to the the cecum, identified by                            appendiceal orifice and ileocecal valve. The                             ileocecal valve, appendiceal orifice, and rectum                            were photographed. The quality of the bowel                            preparation was excellent. The colonoscopy was                            performed without difficulty. The patient tolerated                            the procedure well. The bowel preparation used was                            SUPREP via split dose instruction. Scope In: 2:58:14 PM Scope Out: 3:05:35 PM Scope Withdrawal Time: 0 hours 4 minutes 56 seconds  Total Procedure Duration: 0 hours 7 minutes 21 seconds  Findings:                 Internal hemorrhoids were found during                            retroflexion. The hemorrhoids were small.                           The exam was otherwise without abnormality on                            direct and retroflexion views. Complications:            No immediate complications. Estimated blood loss:                            None. Estimated Blood Loss:     Estimated blood loss: none. Impression:               - Internal hemorrhoids.                           -  The examination was otherwise normal on direct                            and retroflexion views.                           - No specimens collected. Recommendation:           - Repeat colonoscopy in 10 years for screening                            purposes.                           - Patient has a contact number available for                            emergencies. The signs and symptoms of potential                            delayed complications were discussed with the                            patient. Return to normal activities tomorrow.                            Written discharge instructions were provided to the                            patient.                           - Resume previous diet.                           - Continue present medications.                           - CITRUCEL fiber. Tea  2 tablespoons daily in 12 to                            14 ounces of water or juice. You can pick this up                            over-the-counter                           ?" Prescribe Anusol HC suppository; #30; 1 PR                            nightly as needed. 3 refills                           Return to the care of your primary provider Wilhemina Bonito. Marina Goodell, MD 04/16/2023 3:14:31 PM This report has been signed electronically.
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
## 2023-04-16 NOTE — Progress Notes (Unsigned)
Pt irritated and restless after waking up from procedure. Pt trying to get out of bed unassisted. Asked pt to sit back in bed and he followed directions. IV removed. Discharge instructions reviewed with patient. Pt stated " he did not care " and threw part of his instruction in the trash. Pt was unhooked from the monitor and got dressed independently. Pt refused to sit in wheelchair and walked off the unit against advice. Nurse tech followed pt off the floor and witnessed him getting in car with care partner.

## 2023-04-16 NOTE — Progress Notes (Deleted)
Called to room to assist during endoscopic procedure.  Patient ID and intended procedure confirmed with present staff. Received instructions for my participation in the procedure from the performing physician.  

## 2023-04-16 NOTE — Patient Instructions (Signed)
Handout on hemorrhoids given to you today   YOU HAD AN ENDOSCOPIC PROCEDURE TODAY AT THE [facility]:   Refer to the procedure report that was given to you for any specific questions about what was found during the examination.  If the procedure report does not answer your questions, please call your gastroenterologist to clarify.  If you requested that your care partner not be given the details of your procedure findings, then the procedure report has been included in a sealed envelope for you to review at your convenience later.  YOU SHOULD EXPECT: Some feelings of bloating in the abdomen. Passage of more gas than usual.  Walking can help get rid of the air that was put into your GI tract during the procedure and reduce the bloating. If you had a lower endoscopy (such as a colonoscopy or flexible sigmoidoscopy) you may notice spotting of blood in your stool or on the toilet paper. If you underwent a bowel prep for your procedure, you may not have a normal bowel movement for a few days.  Please Note:  You might notice some irritation and congestion in your nose or some drainage.  This is from the oxygen used during your procedure.  There is no need for concern and it should clear up in a day or so.  SYMPTOMS TO REPORT IMMEDIATELY:  Following lower endoscopy (colonoscopy or flexible sigmoidoscopy):  Excessive amounts of blood in the stool  Significant tenderness or worsening of abdominal pains  Swelling of the abdomen that is new, acute  Fever of 100F or higher  For urgent or emergent issues, a gastroenterologist can be reached at any hour by calling [phone]. Do not use MyChart messaging for urgent concerns.    DIET:  We do recommend a small meal at first, but then you may proceed to your regular diet.  Drink plenty of fluids but you should avoid alcoholic beverages for 24 hours.  ACTIVITY:  You should plan to take it easy for the rest of today and you should NOT DRIVE or  use heavy machinery until tomorrow (because of the sedation medicines used during the test).    FOLLOW UP: Our staff will call the number listed on your records the next business day following your procedure.  We will call around 7:15- 8:00 am to check on you and address any questions or concerns that you may have regarding the information given to you following your procedure. If we do not reach you, we will leave a message.      SIGNATURES/CONFIDENTIALITY: You and/or your care partner have signed paperwork which will be entered into your electronic medical record.  These signatures attest to the fact that that the information above on your After Visit Summary has been reviewed and is understood.  Full responsibility of the confidentiality of this discharge information lies with you and/or your care-partner. 

## 2023-04-19 ENCOUNTER — Telehealth: Payer: Self-pay

## 2023-04-19 NOTE — Telephone Encounter (Signed)
Spoke with patient, he no longer wishes to schedule hemorrhoid banding.

## 2023-04-19 NOTE — Telephone Encounter (Signed)
Inbound call from patient, states he is doing well. States he is having a little amount of rectal bleeding, patient would like to discuss hemorrhoid banding per Dr. Lamar Sprinkles recommendation.

## 2023-04-19 NOTE — Telephone Encounter (Signed)
This is not an [facility] appointment. Needs to go to office.

## 2023-04-19 NOTE — Telephone Encounter (Signed)
FU call placed, wrong number on file.

## 2023-04-19 NOTE — Telephone Encounter (Addendum)
Inbound call from patient, calling to schedule a hemorrhoid banding, Bonita Quin, RN, advised we would have to wait for Dr. Lamar Sprinkles recommendation prior to scheduling banding. Patient was on the line, but provided no communication, after multiple attempts to discuss with patient, phone call was disconnected.

## 2023-04-19 NOTE — Telephone Encounter (Signed)
Returned pt's call. He reports he is still having some bleeding per rectum, but states it is less than he was having prior to exam. Informed pt that a prescription was sent to his pharmacy for Anusol suppositories to be used daily as needed for hemorrhoid discomfort. Pt states he was not aware of this or he did not remember being told. RN encouraged pt to pick up this prescription and try it, also instructed pt that he could use OTC preporationH cream if needed for discomfort. Instructed pt to call back to main number to get scheduled for hemorrhoid banding procedure. Pt verbalized understanding.

## 2023-04-23 ENCOUNTER — Ambulatory Visit: Payer: Medicaid Other | Attending: Nurse Practitioner

## 2023-04-23 ENCOUNTER — Other Ambulatory Visit: Payer: Self-pay

## 2023-04-23 DIAGNOSIS — M5412 Radiculopathy, cervical region: Secondary | ICD-10-CM | POA: Diagnosis not present

## 2023-04-23 DIAGNOSIS — M6281 Muscle weakness (generalized): Secondary | ICD-10-CM | POA: Insufficient documentation

## 2023-04-28 ENCOUNTER — Encounter: Payer: Medicaid Other | Admitting: Student
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
## 2023-04-29 ENCOUNTER — Ambulatory Visit: Payer: Medicaid Other | Admitting: Physician Assistant

## 2023-04-29 ENCOUNTER — Ambulatory Visit: Payer: Medicaid Other

## 2023-04-29 DIAGNOSIS — M5412 Radiculopathy, cervical region: Secondary | ICD-10-CM

## 2023-04-29 DIAGNOSIS — M6281 Muscle weakness (generalized): Secondary | ICD-10-CM | POA: Diagnosis not present

## 2023-04-30 ENCOUNTER — Other Ambulatory Visit: Payer: Self-pay
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
## 2023-05-03 ENCOUNTER — Ambulatory Visit: Payer: Medicaid Other

## 2023-05-04 ENCOUNTER — Other Ambulatory Visit: Payer: Self-pay | Admitting: Pharmacist

## 2023-05-04 DIAGNOSIS — M5412 Radiculopathy, cervical region: Secondary | ICD-10-CM | POA: Diagnosis not present

## 2023-05-04 NOTE — Progress Notes (Signed)
Patient filling through [facility] now.  Margarite Gouge, PharmD, CPP, BCIDP, AAHIVP Clinical Pharmacist Practitioner Infectious Diseases Clinical Pharmacist [facility]

## 2023-05-05 ENCOUNTER — Telehealth: Payer: Self-pay | Admitting: Family Medicine

## 2023-05-05 ENCOUNTER — Other Ambulatory Visit: Payer: Self-pay

## 2023-05-05 DIAGNOSIS — R454 Irritability and anger: Secondary | ICD-10-CM | POA: Diagnosis not present

## 2023-05-05 DIAGNOSIS — F4321 Adjustment disorder with depressed mood: Secondary | ICD-10-CM | POA: Diagnosis not present

## 2023-05-05 NOTE — Telephone Encounter (Signed)
Called patient to get clarity on concerns for appointment patient picked up and hung phone up after I announced who I was and what I was calling for.

## 2023-05-05 NOTE — Telephone Encounter (Signed)
Noted in chart patient is not allowed to schedule at any of our community clinics

## 2023-05-05 NOTE — Telephone Encounter (Signed)
Copied from CRM [id number]. Topic: General - Other >> May 05, 2023  9:21 AM Turkey B wrote: Reason for CRM: pt called in  states doesn't know why and who assigned hi to Dr Andrey Campanile as his Dr, but he says he wants to go back to his previous Dr because he already knows them, so he won't be coming back to this practice.
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
## 2023-05-06 ENCOUNTER — Ambulatory Visit: Payer: Medicaid Other

## 2023-05-06 ENCOUNTER — Emergency Department (HOSPITAL_COMMUNITY)
Admission: EM | Admit: 2023-05-06 | Discharge: 2023-05-06 | Disposition: A | Discharge status: Home / Self Care | Payer: Medicaid Other | Attending: Emergency Medicine | Admitting: Emergency Medicine

## 2023-05-06 ENCOUNTER — Other Ambulatory Visit: Payer: Self-pay

## 2023-05-06 DIAGNOSIS — Z21 Asymptomatic human immunodeficiency virus [HIV] infection status: Secondary | ICD-10-CM | POA: Insufficient documentation

## 2023-05-06 DIAGNOSIS — R410 Disorientation, unspecified: Secondary | ICD-10-CM | POA: Insufficient documentation

## 2023-05-06 DIAGNOSIS — G40909 Epilepsy, unspecified, not intractable, without status epilepticus: Secondary | ICD-10-CM | POA: Insufficient documentation

## 2023-05-06 DIAGNOSIS — N189 Chronic kidney disease, unspecified: Secondary | ICD-10-CM | POA: Diagnosis not present

## 2023-05-06 DIAGNOSIS — I10 Essential (primary) hypertension: Secondary | ICD-10-CM | POA: Diagnosis not present

## 2023-05-06 DIAGNOSIS — R569 Unspecified convulsions: Secondary | ICD-10-CM

## 2023-05-06 DIAGNOSIS — G4489 Other headache syndrome: Secondary | ICD-10-CM | POA: Diagnosis not present

## 2023-05-06 MED ORDER — HALOPERIDOL LACTATE 5 MG/ML IJ SOLN
1.0000 mg | Freq: Once | INTRAMUSCULAR | Status: DC
  Filled 2023-05-06: qty 1

## 2023-05-06 MED ORDER — LORAZEPAM 2 MG/ML IJ SOLN: 2.0000 mg | Freq: Once | INTRAMUSCULAR | Status: DC

## 2023-05-06 MED ORDER — DIPHENHYDRAMINE HCL 50 MG/ML IJ SOLN
50.0000 mg | Freq: Once | INTRAMUSCULAR | Status: DC
  Filled 2023-05-06: qty 1

## 2023-05-06 MED ORDER — LEVETIRACETAM IN NACL 1500 MG/100ML IV SOLN
1500.0000 mg | Freq: Once | INTRAVENOUS | Status: DC
  Filled 2023-05-06: qty 100

## 2023-05-06 NOTE — Discharge Instructions (Addendum)
Please return to ED if you decide you want to be admitted for multiple break through seizures

## 2023-05-06 NOTE — ED Notes (Signed)
PT is still attempting to get up and use the bathroom. We have given him a urinal and educated him about how unsafe it is for him to be walking at this time. PT is still not in agreement. Fall precautions are in place, PT will not allow the tech Forest Gleason) to take off his shoes.

## 2023-05-06 NOTE — ED Notes (Signed)
PT walked to the bathroom very unsteady on his feet and is currently ripping all of his cords off.

## 2023-05-06 NOTE — ED Triage Notes (Signed)
Patient BIB by EMS from home. C/O seizure activity x3 with headache per patient unwitnessed. Seizure with EMS x15sec tonic/clonic, no medication needed. 160/100, 84, 18, 100%ra, CBG 130. 97.6. 18gLAC

## 2023-05-06 NOTE — ED Provider Notes (Signed)
[facility] DEPARTMENT AT [facility] Provider Note   CSN: [id number] Arrival date & time: 05/06/23  1502     History  Chief Complaint  Patient presents with   Seizures    Perry Norton is a 47 y.o. male.  Patient is a 47 year old male with past medical history of seizure disorder on Keppra, HIV, CKD, marijuana use, pretension, and hyperlipidemia presenting for breakthrough seizure.  Patient was reported to have had an unwitnessed seizure this morning.  EMS stated and route he had of myoclonic seizure that resolved prior to medical intervention.  Patient states he usually takes Keppra and has not missed any home doses.  Last dose of Keppra was this morning.  He admits to diarrhea last night but otherwise denies fevers, chills, nausea, vomiting, abdominal pain.  He states that he has been eating and drinking normally without missed meals.  POC glucose stable.  He denies substance use.  The history is provided by the patient. No language interpreter was used.  Seizures      Home Medications Prior to Admission medications   Medication Sig Start Date End Date Taking? Authorizing Provider  amLODipine (NORVASC) 5 MG tablet Take 1 tablet (5 mg total) by mouth daily. 03/25/23   Georganna Skeans, MD  bictegravir-emtricitabine-tenofovir AF (BIKTARVY) 50-200-25 MG TABS tablet Take 1 tablet by mouth daily. 02/15/23   Randall Hiss, MD  diclofenac Sodium (VOLTAREN) 1 % GEL Apply topically. 12/06/18   [provider]  DULoxetine (CYMBALTA) 30 MG capsule Take 30 mg by mouth daily. 02/21/23   [provider]  hydrocortisone (ANUSOL-HC) 25 MG suppository Place 1 suppository (25 mg total) rectally daily as needed for hemorrhoids or anal itching. Nightly as needed 04/16/23   Hilarie Fredrickson, MD  lacosamide (VIMPAT) 200 MG TABS tablet Take 1 tablet (200 mg total) by mouth 2 (two) times daily. 04/08/22   Hongalgi, Maximino Greenland, MD  lamoTRIgine (LAMICTAL) 100 MG tablet Take  by mouth. 01/20/23   [provider]  levETIRAcetam (KEPPRA) 750 MG tablet Take 2 tablets (1,500 mg total) by mouth 2 (two) times daily. 04/08/22   Hongalgi, Maximino Greenland, MD  metoprolol succinate (TOPROL-XL) 25 MG 24 hr tablet Take by mouth. 02/19/20   [provider]  Oxcarbazepine (TRILEPTAL) 300 MG tablet Take by mouth. 02/19/20   [provider]  rosuvastatin (CRESTOR) 10 MG tablet Take 1 tablet (10 mg total) by mouth daily. 02/17/23   Kuppelweiser, Cassie L, RPH-CPP  sertraline (ZOLOFT) 50 MG tablet Take 1 tablet (50 mg total) by mouth daily. 03/25/23   Georganna Skeans, MD  sildenafil (VIAGRA) 25 MG tablet Take by mouth. 01/19/18   [provider]  Vitamin D, Ergocalciferol, (DRISDOL) 1.25 MG (50000 UNIT) CAPS capsule Take by mouth. 05/30/18   [provider]  zonisamide (ZONEGRAN) 100 MG capsule Take 4 capsules (400 mg total) by mouth at bedtime. 04/08/22   Hongalgi, Maximino Greenland, MD      Allergies    Onion    Review of Systems   Review of Systems  Constitutional:  Negative for chills and fever.  HENT:  Negative for ear pain and sore throat.   Eyes:  Negative for pain and visual disturbance.  Respiratory:  Negative for cough and shortness of breath.   Cardiovascular:  Negative for chest pain and palpitations.  Gastrointestinal:  Negative for abdominal pain and vomiting.  Genitourinary:  Negative for dysuria and hematuria.  Musculoskeletal:  Negative for arthralgias and back  pain.  Skin:  Negative for color change and rash.  Neurological:  Positive for seizures. Negative for syncope.  All other systems reviewed and are negative.   Physical Exam Updated Vital Signs BP (!) 129/93   Pulse 98   Resp 17   Ht 6' (1.829 m)   Wt 80.7 kg   SpO2 100%   BMI 24.13 kg/m  Physical Exam Vitals and nursing note reviewed.  Constitutional:      General: He is not in acute distress.    Appearance: He is well-developed.  HENT:     Head: Normocephalic and  atraumatic.  Eyes:     General: Lids are normal. Vision grossly intact.     Conjunctiva/sclera: Conjunctivae normal.     Pupils: Pupils are equal, round, and reactive to light.  Cardiovascular:     Rate and Rhythm: Normal rate and regular rhythm.     Heart sounds: No murmur heard. Pulmonary:     Effort: Pulmonary effort is normal. No respiratory distress.     Breath sounds: Normal breath sounds.  Abdominal:     Palpations: Abdomen is soft.     Tenderness: There is no abdominal tenderness.  Musculoskeletal:        General: No swelling.     Cervical back: Neck supple.  Skin:    General: Skin is warm and dry.     Capillary Refill: Capillary refill takes less than 2 seconds.  Neurological:     Mental Status: He is alert. He is confused.     GCS: GCS eye subscore is 4. GCS verbal subscore is 4. GCS motor subscore is 6.     Cranial Nerves: Cranial nerves 2-12 are intact.     Sensory: Sensation is intact.     Motor: Motor function is intact.     Coordination: Coordination is intact.     Gait: Gait abnormal.     Comments: Patient's verbal responses somewhat confused.  He does not know the month or the year.  He is very delayed.  And his gait is abnormal.  Psychiatric:        Mood and Affect: Mood normal.     ED Results / Procedures / Treatments   Labs (all labs ordered are listed, but only abnormal results are displayed) Labs Reviewed  CBC WITH DIFFERENTIAL/PLATELET  LIPASE, BLOOD  COMPREHENSIVE METABOLIC PANEL  ETHANOL  RAPID URINE DRUG SCREEN, HOSP PERFORMED  CK  MAGNESIUM  URINALYSIS, ROUTINE W REFLEX MICROSCOPIC  ACETAMINOPHEN LEVEL  SALICYLATE LEVEL  LAMOTRIGINE LEVEL  I-STAT CG4 LACTIC ACID, ED    EKG None  Radiology No results found.  Procedures Procedures    Medications Ordered in ED Medications  levETIRAcetam (KEPPRA) IVPB 1500 mg/ 100 mL premix (has no administration in time range)    ED Course/ Medical Decision Making/ A&P                                  Medical Decision Making Amount and/or Complexity of Data Reviewed Labs: ordered.  Risk Prescription drug management.   88:20 PM 47 year old male with past medical history of seizure disorder on Keppra, HIV, CKD, marijuana use, pretension, and hyperlipidemia presenting for breakthrough seizure.  On my evaluation patient is alert and oriented x 3, no acute distress, afebrile, stable vital signs.  Physical exam demonstrates patient's verbal responses somewhat confused.  He does not know the month or the year.  He is  very delayed.  And his gait is abnormal.  Physical exam details likely secondary to postictal state.  Patient added that he is leaving at this time.  Refuses laboratory studies.  CT scan applied his IV.  Patient given 25 minutes in the emergency department to rest.     Patient is becoming agitated and aggressive.  Stating he is going to pull out his IV.  Shaking and angrily pacing his room.  Security called to bedside.  Medications ordered however patient after de-escalation allowed me to repeat the neurological exam.  No medications were given at this time for patient aggression or agitation.    Neurological exam repeated.  Patient is more awake at this time.  He is alert and oriented x 3, with normal gait, and no neurovascular deficits.  Patient's wife Clydie Braun called and updated and she is coming to pick him up.  My concern for him leaving at this time is repeat seizures with the risk of further injury if he is driving.  He is also at risk for falls with head trauma.  The patient has requested to leave the ED against medical advice. I believe this patient is of sound mind and medical decision making capacity to refuse medical care. The patient is responding and asking questions appropriately. The patient is oriented to person, place and time. The patient is not psychotic, delusional, suicidal, homicidal or hallucinating. The patient demonstrates a normal mental capacity to make  decisions regarding their healthcare. The patient is clinically sober and does not appear to be under the influence of any illicit drugs at this time. The patient has been advised of the risks, in layman terms, of leaving AMA which include, but are not limited to death, coma, permanent disability, loss of current lifestyle, delay in diagnosis. Alternatives have been offered - the patient remains steadfast in their wish to leave. The patient has been advised that should they change their mind they are welcome to return to this hospital, or any other, at any time. The patient understands that in no way does an AMA discharge mean that I do not want them to have the best medical care available. To this end, I have offered appropriate prescriptions, referrals, and discharge instructions. The patient did sign AMA paperwork. The above discussion was witnessed by another member of staff.         Final Clinical Impression(s) / ED Diagnoses Final diagnoses:  Seizure [facility])    Rx / DC Orders ED Discharge Orders     None         Franne Forts, DO 05/06/23 1652

## 2023-05-06 NOTE — ED Notes (Signed)
PT is refusing all medications and labs and wants to just use the bathroom and go home. He is very lethargic. Edwin Dada P DO was notified and is bedside at this time

## 2023-05-06 NOTE — ED Notes (Signed)
PT is requesting his door to be closed. I shut it halfway and he is ok with that.

## 2023-05-06 NOTE — ED Notes (Signed)
Security was called to bedside.

## 2023-05-07 ENCOUNTER — Telehealth: Payer: Self-pay

## 2023-05-07 NOTE — Telephone Encounter (Signed)
TC due to missed visit as patient was at ED with seizure activity.  Number rand busy over several attempts.
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
## 2023-05-10 ENCOUNTER — Ambulatory Visit: Payer: Medicaid Other

## 2023-05-10 DIAGNOSIS — M6281 Muscle weakness (generalized): Secondary | ICD-10-CM | POA: Diagnosis not present

## 2023-05-10 DIAGNOSIS — M5412 Radiculopathy, cervical region: Secondary | ICD-10-CM | POA: Diagnosis not present
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
## 2023-05-12 ENCOUNTER — Ambulatory Visit: Payer: Medicaid Other

## 2023-05-12 ENCOUNTER — Telehealth: Payer: Self-pay

## 2023-05-12 NOTE — Telephone Encounter (Signed)
TC due to missed appointment, VM left with reminder of next appointment date and time as well as attendance policy regarding missed visits
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
## 2023-05-17 ENCOUNTER — Telehealth: Payer: Self-pay

## 2023-05-17 ENCOUNTER — Ambulatory Visit: Payer: Medicaid Other
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
## 2023-05-17 NOTE — Telephone Encounter (Signed)
TC due to missed visit.  VM left regarding need to obtain new referral and cancellation of all visits going forward per attendance policy.  Instructed to call clinic with questions or concerns.

## 2023-05-18 ENCOUNTER — Ambulatory Visit: Payer: Medicaid Other

## 2023-05-19 ENCOUNTER — Encounter: Payer: Self-pay | Admitting: Infectious Disease

## 2023-05-21 ENCOUNTER — Other Ambulatory Visit: Payer: Self-pay

## 2023-05-24 ENCOUNTER — Other Ambulatory Visit: Payer: Self-pay

## 2023-05-24 ENCOUNTER — Other Ambulatory Visit (HOSPITAL_COMMUNITY): Payer: Self-pay

## 2023-05-24 DIAGNOSIS — B20 Human immunodeficiency virus [HIV] disease: Secondary | ICD-10-CM

## 2023-05-24 MED ORDER — BIKTARVY 50-200-25 MG PO TABS: 1.0000 | ORAL_TABLET | Freq: Every day | ORAL | 5 refills | Status: DC

## 2023-05-24 NOTE — Progress Notes (Signed)
Transfer of care to [facility]

## 2023-05-25 ENCOUNTER — Other Ambulatory Visit (HOSPITAL_COMMUNITY): Payer: Self-pay

## 2023-05-26 ENCOUNTER — Other Ambulatory Visit (HOSPITAL_COMMUNITY)
Admission: RE | Admit: 2023-05-26 | Discharge: 2023-05-26 | Disposition: A | Discharge status: Home / Self Care | Payer: Medicaid Other | Source: Ambulatory Visit | Attending: Infectious Disease | Admitting: Infectious Disease

## 2023-05-26 ENCOUNTER — Other Ambulatory Visit: Payer: Self-pay

## 2023-05-26 ENCOUNTER — Encounter: Payer: Self-pay | Admitting: Infectious Disease

## 2023-05-26 ENCOUNTER — Ambulatory Visit (INDEPENDENT_AMBULATORY_CARE_PROVIDER_SITE_OTHER): Payer: Medicaid Other | Admitting: Infectious Disease

## 2023-05-26 VITALS — BP 130/78 | HR 77 | Wt 176.0 lb

## 2023-05-26 DIAGNOSIS — G40909 Epilepsy, unspecified, not intractable, without status epilepticus: Secondary | ICD-10-CM | POA: Diagnosis not present

## 2023-05-26 DIAGNOSIS — I10 Essential (primary) hypertension: Secondary | ICD-10-CM | POA: Diagnosis not present

## 2023-05-26 DIAGNOSIS — B20 Human immunodeficiency virus [HIV] disease: Secondary | ICD-10-CM | POA: Insufficient documentation

## 2023-05-26 DIAGNOSIS — A539 Syphilis, unspecified: Secondary | ICD-10-CM | POA: Insufficient documentation

## 2023-05-26 DIAGNOSIS — Z202 Contact with and (suspected) exposure to infections with a predominantly sexual mode of transmission: Secondary | ICD-10-CM

## 2023-05-26 DIAGNOSIS — E785 Hyperlipidemia, unspecified: Secondary | ICD-10-CM

## 2023-05-26 DIAGNOSIS — Z113 Encounter for screening for infections with a predominantly sexual mode of transmission: Secondary | ICD-10-CM | POA: Diagnosis not present

## 2023-05-26 MED ORDER — BIKTARVY 50-200-25 MG PO TABS: 1.0000 | ORAL_TABLET | Freq: Every day | ORAL | 11 refills | Status: DC

## 2023-05-26 MED ORDER — METRONIDAZOLE 500 MG PO TABS
2000.0000 mg | ORAL_TABLET | Freq: Once | ORAL | 0 refills | Status: DC
Start: 2023-05-26 — End: 2023-06-10

## 2023-05-26 MED ORDER — ROSUVASTATIN CALCIUM 10 MG PO TABS: 10.0000 mg | ORAL_TABLET | Freq: Every day | ORAL | 3 refills | Status: DC
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
## 2023-05-26 NOTE — Addendum Note (Signed)
Addended by: Harley Alto on: 05/26/2023 01:56 PM   Modules accepted: Orders

## 2023-05-26 NOTE — Addendum Note (Signed)
Addended by: Harley Alto on: 05/26/2023 10:57 AM   Modules accepted: Orders

## 2023-05-27 LAB — CYTOLOGY, (ORAL, ANAL, URETHRAL) ANCILLARY ONLY
Chlamydia: NEGATIVE
Comment: NEGATIVE
Comment: NORMAL
Neisseria Gonorrhea: NEGATIVE

## 2023-05-27 LAB — URINE CYTOLOGY ANCILLARY ONLY
Chlamydia: NEGATIVE
Comment: NEGATIVE
Comment: NORMAL
Neisseria Gonorrhea: NEGATIVE

## 2023-05-28 LAB — HIV-1 RNA QUANT-NO REFLEX-BLD
HIV 1 RNA Quant: 103 {copies}/mL — ABNORMAL HIGH
HIV-1 RNA Quant, Log: 2.01 {Log_copies}/mL — ABNORMAL HIGH

## 2023-05-28 LAB — T PALLIDUM AB: T Pallidum Abs: POSITIVE — AB

## 2023-05-28 LAB — RPR TITER: RPR Titer: 1:16 {titer} — ABNORMAL HIGH

## 2023-05-28 LAB — RPR: RPR Ser Ql: REACTIVE — AB

## 2023-06-10 ENCOUNTER — Telehealth: Payer: Self-pay

## 2023-06-10 MED ORDER — METRONIDAZOLE 500 MG PO TABS: 2000.0000 mg | ORAL_TABLET | Freq: Once | ORAL | 0 refills | Status: AC

## 2023-06-10 NOTE — Telephone Encounter (Signed)
Patient called office today requesting new prescription for Metronidazole. States during his flight from [place] lost his luggage that contained prescription. Would like to know if provider can [facility].  Will call patient back once provider responds to message. Juanita Laster, RMA

## 2023-06-10 NOTE — Telephone Encounter (Signed)
Per Dr. Daiva Eves okay to send in refill for patient. Left voicemail stating refill has been called into pharmacy.  Juanita Laster, RMA

## 2023-06-10 NOTE — Addendum Note (Signed)
Addended by: Juanita Laster on: 06/10/2023 01:07 PM   Modules accepted: Orders

## 2023-06-17 ENCOUNTER — Ambulatory Visit: Payer: Medicaid Other
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
## 2023-07-06 DIAGNOSIS — F4321 Adjustment disorder with depressed mood: Secondary | ICD-10-CM | POA: Diagnosis not present

## 2023-07-06 DIAGNOSIS — R454 Irritability and anger: Secondary | ICD-10-CM | POA: Diagnosis not present

## 2023-07-07 DIAGNOSIS — F39 Unspecified mood [affective] disorder: Secondary | ICD-10-CM | POA: Diagnosis not present

## 2023-07-07 DIAGNOSIS — F445 Conversion disorder with seizures or convulsions: Secondary | ICD-10-CM | POA: Diagnosis not present

## 2023-07-27 DIAGNOSIS — R454 Irritability and anger: Secondary | ICD-10-CM | POA: Diagnosis not present

## 2023-07-27 DIAGNOSIS — F4321 Adjustment disorder with depressed mood: Secondary | ICD-10-CM | POA: Diagnosis not present

## 2023-08-03 ENCOUNTER — Other Ambulatory Visit: Payer: Self-pay

## 2023-08-03 DIAGNOSIS — B20 Human immunodeficiency virus [HIV] disease: Secondary | ICD-10-CM

## 2023-08-03 DIAGNOSIS — Z113 Encounter for screening for infections with a predominantly sexual mode of transmission: Secondary | ICD-10-CM

## 2023-08-04 ENCOUNTER — Other Ambulatory Visit: Payer: Self-pay

## 2023-08-04 ENCOUNTER — Other Ambulatory Visit: Payer: Medicaid Other

## 2023-08-04 DIAGNOSIS — B20 Human immunodeficiency virus [HIV] disease: Secondary | ICD-10-CM

## 2023-08-04 DIAGNOSIS — Z113 Encounter for screening for infections with a predominantly sexual mode of transmission: Secondary | ICD-10-CM

## 2023-08-05 LAB — T-HELPER CELL (CD4) - (RCID CLINIC ONLY)
CD4 % Helper T Cell: 32 % — ABNORMAL LOW (ref 33–65)
CD4 T Cell Abs: 674 /uL (ref 400–1790)

## 2023-08-07 LAB — COMPLETE METABOLIC PANEL WITH GFR
AG Ratio: 2.1 (calc) (ref 1.0–2.5)
ALT: 17 U/L (ref 9–46)
AST: 18 U/L (ref 10–40)
Albumin: 4.8 g/dL (ref 3.6–5.1)
Alkaline phosphatase (APISO): 74 U/L (ref 36–130)
BUN/Creatinine Ratio: 5 (calc) — ABNORMAL LOW (ref 6–22)
BUN: 7 mg/dL (ref 7–25)
CO2: 30 mmol/L (ref 20–32)
Calcium: 9.4 mg/dL (ref 8.6–10.3)
Chloride: 104 mmol/L (ref 98–110)
Creat: 1.32 mg/dL — ABNORMAL HIGH (ref 0.60–1.29)
Globulin: 2.3 g/dL (ref 1.9–3.7)
Glucose, Bld: 93 mg/dL (ref 65–99)
Potassium: 3.8 mmol/L (ref 3.5–5.3)
Sodium: 141 mmol/L (ref 135–146)
Total Bilirubin: 0.4 mg/dL (ref 0.2–1.2)
Total Protein: 7.1 g/dL (ref 6.1–8.1)
eGFR: 68 mL/min/{1.73_m2} (ref >=60)

## 2023-08-07 LAB — RPR: RPR Ser Ql: REACTIVE — AB

## 2023-08-07 LAB — CBC WITH DIFFERENTIAL/PLATELET
Absolute Lymphocytes: 2200 {cells}/uL (ref 850–3900)
Absolute Monocytes: 371 {cells}/uL (ref 200–950)
Basophils Absolute: 48 {cells}/uL (ref 0–200)
Basophils Relative: 0.9 %
Eosinophils Absolute: 48 {cells}/uL (ref 15–500)
Eosinophils Relative: 0.9 %
HCT: 42.6 % (ref 38.5–50.0)
Hemoglobin: 13.9 g/dL (ref 13.2–17.1)
MCH: 29.8 pg (ref 27.0–33.0)
MCHC: 32.6 g/dL (ref 32.0–36.0)
MCV: 91.2 fL (ref 80.0–100.0)
MPV: 11.2 fL (ref 7.5–12.5)
Monocytes Relative: 7 %
Neutro Abs: 2634 {cells}/uL (ref 1500–7800)
Neutrophils Relative %: 49.7 %
Platelets: 228 10*3/uL (ref 140–400)
RBC: 4.67 10*6/uL (ref 4.20–5.80)
RDW: 13.1 % (ref 11.0–15.0)
Total Lymphocyte: 41.5 %
WBC: 5.3 10*3/uL (ref 3.8–10.8)

## 2023-08-07 LAB — T PALLIDUM AB: T Pallidum Abs: POSITIVE — AB

## 2023-08-07 LAB — LIPID PANEL
Cholesterol: 218 mg/dL — ABNORMAL HIGH (ref <200)
HDL: 41 mg/dL (ref >=40)
LDL Cholesterol (Calc): 145 mg/dL — ABNORMAL HIGH
Non-HDL Cholesterol (Calc): 177 mg/dL — ABNORMAL HIGH (ref <130)
Total CHOL/HDL Ratio: 5.3 (calc) — ABNORMAL HIGH (ref <5.0)
Triglycerides: 188 mg/dL — ABNORMAL HIGH (ref <150)

## 2023-08-07 LAB — HIV-1 RNA QUANT-NO REFLEX-BLD
HIV 1 RNA Quant: <20 {copies}/mL — ABNORMAL HIGH
HIV-1 RNA Quant, Log: <1.3 {Log} — ABNORMAL HIGH

## 2023-08-07 LAB — RPR TITER: RPR Titer: 1:4 {titer} — ABNORMAL HIGH

## 2023-08-10 ENCOUNTER — Ambulatory Visit (HOSPITAL_BASED_OUTPATIENT_CLINIC_OR_DEPARTMENT_OTHER): Payer: Medicaid Other | Attending: Nurse Practitioner | Admitting: Physical Therapy

## 2023-08-10 ENCOUNTER — Other Ambulatory Visit: Payer: Self-pay

## 2023-08-10 ENCOUNTER — Encounter (HOSPITAL_BASED_OUTPATIENT_CLINIC_OR_DEPARTMENT_OTHER): Payer: Self-pay | Admitting: Physical Therapy

## 2023-08-10 DIAGNOSIS — M5412 Radiculopathy, cervical region: Secondary | ICD-10-CM

## 2023-08-10 DIAGNOSIS — M6281 Muscle weakness (generalized): Secondary | ICD-10-CM | POA: Diagnosis not present

## 2023-08-10 DIAGNOSIS — R2689 Other abnormalities of gait and mobility: Secondary | ICD-10-CM

## 2023-08-10 DIAGNOSIS — M542 Cervicalgia: Secondary | ICD-10-CM | POA: Diagnosis not present
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
## 2023-08-16 ENCOUNTER — Telehealth: Payer: Self-pay

## 2023-08-16 ENCOUNTER — Telehealth: Payer: Self-pay | Admitting: Infectious Disease

## 2023-08-16 NOTE — Telephone Encounter (Signed)
Left voicemail asking patient to return my call.

## 2023-08-16 NOTE — Telephone Encounter (Signed)
 Hi Alani   Could you call Dr Zenaida Niece Dam's office to reschedule your appointment on Wednesday, 08-18-23 as Dr Daiva Eves will be out of the office. Call [phone] to reschedule   Thanks   Claris Che

## 2023-08-16 NOTE — Telephone Encounter (Signed)
 Patient called requesting lab results

## 2023-08-17 DIAGNOSIS — F4321 Adjustment disorder with depressed mood: Secondary | ICD-10-CM | POA: Diagnosis not present

## 2023-08-18 ENCOUNTER — Ambulatory Visit: Payer: Medicaid Other | Admitting: Infectious Disease

## 2023-08-23 DIAGNOSIS — M5412 Radiculopathy, cervical region: Secondary | ICD-10-CM | POA: Diagnosis not present

## 2023-08-23 DIAGNOSIS — G90512 Complex regional pain syndrome I of left upper limb: Secondary | ICD-10-CM | POA: Diagnosis not present

## 2023-08-24 ENCOUNTER — Ambulatory Visit (HOSPITAL_BASED_OUTPATIENT_CLINIC_OR_DEPARTMENT_OTHER): Payer: Medicaid Other | Attending: Nurse Practitioner | Admitting: Physical Therapy

## 2023-08-24 ENCOUNTER — Other Ambulatory Visit: Payer: Self-pay

## 2023-08-24 ENCOUNTER — Encounter (HOSPITAL_COMMUNITY): Payer: Self-pay

## 2023-08-24 ENCOUNTER — Telehealth (HOSPITAL_BASED_OUTPATIENT_CLINIC_OR_DEPARTMENT_OTHER): Payer: Self-pay | Admitting: Physical Therapy

## 2023-08-24 ENCOUNTER — Emergency Department (HOSPITAL_COMMUNITY)
Admission: EM | Admit: 2023-08-24 | Discharge: 2023-08-24 | Disposition: A | Discharge status: Home / Self Care | Attending: Emergency Medicine | Admitting: Emergency Medicine

## 2023-08-24 DIAGNOSIS — Z79899 Other long term (current) drug therapy: Secondary | ICD-10-CM | POA: Insufficient documentation

## 2023-08-24 DIAGNOSIS — R569 Unspecified convulsions: Secondary | ICD-10-CM

## 2023-08-24 DIAGNOSIS — G40909 Epilepsy, unspecified, not intractable, without status epilepticus: Secondary | ICD-10-CM | POA: Diagnosis not present

## 2023-08-24 DIAGNOSIS — M542 Cervicalgia: Secondary | ICD-10-CM | POA: Insufficient documentation

## 2023-08-24 DIAGNOSIS — R079 Chest pain, unspecified: Secondary | ICD-10-CM | POA: Diagnosis not present

## 2023-08-24 DIAGNOSIS — Z21 Asymptomatic human immunodeficiency virus [HIV] infection status: Secondary | ICD-10-CM | POA: Insufficient documentation

## 2023-08-24 DIAGNOSIS — R519 Headache, unspecified: Secondary | ICD-10-CM | POA: Diagnosis not present

## 2023-08-24 DIAGNOSIS — M5412 Radiculopathy, cervical region: Secondary | ICD-10-CM | POA: Insufficient documentation

## 2023-08-24 DIAGNOSIS — I10 Essential (primary) hypertension: Secondary | ICD-10-CM | POA: Diagnosis not present

## 2023-08-24 DIAGNOSIS — R2689 Other abnormalities of gait and mobility: Secondary | ICD-10-CM | POA: Insufficient documentation

## 2023-08-24 DIAGNOSIS — R410 Disorientation, unspecified: Secondary | ICD-10-CM | POA: Diagnosis not present

## 2023-08-24 DIAGNOSIS — M6281 Muscle weakness (generalized): Secondary | ICD-10-CM | POA: Insufficient documentation

## 2023-08-24 DIAGNOSIS — R0789 Other chest pain: Secondary | ICD-10-CM | POA: Diagnosis not present

## 2023-08-24 LAB — BASIC METABOLIC PANEL
Anion gap: 14 (ref 5–15)
BUN: 10 mg/dL (ref 6–20)
CO2: 25 mmol/L (ref 22–32)
Calcium: 9.6 mg/dL (ref 8.9–10.3)
Chloride: 103 mmol/L (ref 98–111)
Creatinine, Ser: 1.34 mg/dL — ABNORMAL HIGH (ref 0.61–1.24)
GFR, Estimated: >60 mL/min (ref >60)
Glucose, Bld: 118 mg/dL — ABNORMAL HIGH (ref 70–99)
Potassium: 3.3 mmol/L — ABNORMAL LOW (ref 3.5–5.1)
Sodium: 142 mmol/L (ref 135–145)

## 2023-08-24 LAB — CBC WITH DIFFERENTIAL/PLATELET
Abs Immature Granulocytes: 0.01 10*3/uL (ref 0.00–0.07)
Basophils Absolute: 0.1 10*3/uL (ref 0.0–0.1)
Basophils Relative: 1 %
Eosinophils Absolute: 0.1 10*3/uL (ref 0.0–0.5)
Eosinophils Relative: 2 %
HCT: 43.1 % (ref 39.0–52.0)
Hemoglobin: 14.2 g/dL (ref 13.0–17.0)
Immature Granulocytes: 0 %
Lymphocytes Relative: 49 %
Lymphs Abs: 3.2 10*3/uL (ref 0.7–4.0)
MCH: 30 pg (ref 26.0–34.0)
MCHC: 32.9 g/dL (ref 30.0–36.0)
MCV: 91.1 fL (ref 80.0–100.0)
Monocytes Absolute: 0.5 10*3/uL (ref 0.1–1.0)
Monocytes Relative: 7 %
Neutro Abs: 2.6 10*3/uL (ref 1.7–7.7)
Neutrophils Relative %: 41 %
Platelets: 215 10*3/uL (ref 150–400)
RBC: 4.73 MIL/uL (ref 4.22–5.81)
RDW: 12.8 % (ref 11.5–15.5)
WBC: 6.4 10*3/uL (ref 4.0–10.5)
nRBC: 0 % (ref 0.0–0.2)

## 2023-08-24 LAB — TROPONIN I (HIGH SENSITIVITY)
Troponin I (High Sensitivity): 9 ng/L (ref <18)
Troponin I (High Sensitivity): 5 ng/L (ref <18)
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
## 2023-08-24 NOTE — ED Notes (Signed)
 Pt ambulatory to restroom with steady gait.

## 2023-08-24 NOTE — Discharge Instructions (Addendum)
 While you were in the emergency room, you had blood work done that was normal.  Continue to take your seizure medications, and all of your medications.  I do recommend that you follow-up with your neurologist within 1 to 2 weeks.  We did test to look for signs of injury to your heart, and these tests were normal.  Please follow-up with your PCP.

## 2023-08-24 NOTE — ED Triage Notes (Signed)
 Called out initially for chest pain. EMS states he presented post-ictal. Patient was disoriented on scene, diaphoretic, and only alert to himself. Patient given 324 ASA. 0.4 of nitroglycerin. 18G IV RAC. Patient is now alert and oriented x4, but states he has a headache, with dull pain in his chest. Describes pain as twisting. Hx of seizure, HIV

## 2023-08-24 NOTE — ED Notes (Signed)
 I tried to get oral temp and axillary temp on patient and he just wouldn't cooperate

## 2023-08-24 NOTE — ED Notes (Signed)
 Patient was observed getting up and walking to the bathroom. This nurse tried to assist the patient with walking due to the patient being somewhat dizzy and stumbling, however the patient did not respond to anything I asked or said. Patient then closed the door to the bathroom stating he did not need help. Security had to be called inorder to get the patient in to a wheelchair and taking to the [facility] room. Patient is now in room. States he does not like to be touched by people he does not know. Also stated he did not want to lay down in the bed. Stated " hurry up and do what you have to do so that I can get the hell out of here".

## 2023-08-24 NOTE — Telephone Encounter (Signed)
 Called pt re: this afternoon's missed appt - his spouse answers his phone and states they had a rough evening, will call us back to reschedule.
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
## 2023-08-26 ENCOUNTER — Ambulatory Visit (HOSPITAL_BASED_OUTPATIENT_CLINIC_OR_DEPARTMENT_OTHER): Payer: Medicaid Other | Admitting: Physical Therapy

## 2023-08-26 ENCOUNTER — Encounter (HOSPITAL_BASED_OUTPATIENT_CLINIC_OR_DEPARTMENT_OTHER): Payer: Self-pay | Admitting: Physical Therapy

## 2023-08-26 DIAGNOSIS — R454 Irritability and anger: Secondary | ICD-10-CM | POA: Diagnosis not present

## 2023-08-26 DIAGNOSIS — M6281 Muscle weakness (generalized): Secondary | ICD-10-CM

## 2023-08-26 DIAGNOSIS — M5412 Radiculopathy, cervical region: Secondary | ICD-10-CM

## 2023-08-26 DIAGNOSIS — M542 Cervicalgia: Secondary | ICD-10-CM | POA: Diagnosis not present

## 2023-08-26 DIAGNOSIS — R2689 Other abnormalities of gait and mobility: Secondary | ICD-10-CM | POA: Diagnosis not present

## 2023-08-26 DIAGNOSIS — F4321 Adjustment disorder with depressed mood: Secondary | ICD-10-CM | POA: Diagnosis not present

## 2023-08-31 ENCOUNTER — Ambulatory Visit (HOSPITAL_BASED_OUTPATIENT_CLINIC_OR_DEPARTMENT_OTHER): Payer: Medicaid Other | Admitting: Physical Therapy

## 2023-08-31 ENCOUNTER — Encounter (HOSPITAL_BASED_OUTPATIENT_CLINIC_OR_DEPARTMENT_OTHER): Payer: Self-pay | Admitting: Physical Therapy

## 2023-08-31 DIAGNOSIS — M5412 Radiculopathy, cervical region: Secondary | ICD-10-CM | POA: Diagnosis not present

## 2023-08-31 DIAGNOSIS — M6281 Muscle weakness (generalized): Secondary | ICD-10-CM | POA: Diagnosis not present

## 2023-08-31 DIAGNOSIS — R2689 Other abnormalities of gait and mobility: Secondary | ICD-10-CM | POA: Diagnosis not present

## 2023-08-31 DIAGNOSIS — M542 Cervicalgia: Secondary | ICD-10-CM
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
## 2023-09-01 DIAGNOSIS — R454 Irritability and anger: Secondary | ICD-10-CM | POA: Diagnosis not present

## 2023-09-01 DIAGNOSIS — F5101 Primary insomnia: Secondary | ICD-10-CM | POA: Diagnosis not present

## 2023-09-01 DIAGNOSIS — F4321 Adjustment disorder with depressed mood: Secondary | ICD-10-CM | POA: Diagnosis not present

## 2023-09-02 ENCOUNTER — Telehealth (HOSPITAL_BASED_OUTPATIENT_CLINIC_OR_DEPARTMENT_OTHER): Payer: Self-pay | Admitting: Physical Therapy

## 2023-09-02 ENCOUNTER — Ambulatory Visit (HOSPITAL_BASED_OUTPATIENT_CLINIC_OR_DEPARTMENT_OTHER): Payer: Medicaid Other | Admitting: Physical Therapy

## 2023-09-02 NOTE — Telephone Encounter (Signed)
 Patient no show, phoned patient regarding missed appointment and spoke with patients family. Reminded of next appointment and instructed to cancel if unable to attend.   1:37 PM, 09/02/23 Wyman Songster PT, DPT Physical Therapist at [facility]

## 2023-09-07 ENCOUNTER — Ambulatory Visit (HOSPITAL_BASED_OUTPATIENT_CLINIC_OR_DEPARTMENT_OTHER): Payer: Medicaid Other | Admitting: Physical Therapy

## 2023-09-07 ENCOUNTER — Encounter (HOSPITAL_BASED_OUTPATIENT_CLINIC_OR_DEPARTMENT_OTHER): Payer: Self-pay | Admitting: Physical Therapy

## 2023-09-07 DIAGNOSIS — M542 Cervicalgia: Secondary | ICD-10-CM | POA: Diagnosis not present

## 2023-09-07 DIAGNOSIS — R2689 Other abnormalities of gait and mobility: Secondary | ICD-10-CM

## 2023-09-07 DIAGNOSIS — M5412 Radiculopathy, cervical region: Secondary | ICD-10-CM

## 2023-09-07 DIAGNOSIS — M6281 Muscle weakness (generalized): Secondary | ICD-10-CM
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
## 2023-09-08 ENCOUNTER — Ambulatory Visit (INDEPENDENT_AMBULATORY_CARE_PROVIDER_SITE_OTHER): Payer: Medicaid Other | Admitting: Infectious Disease

## 2023-09-08 ENCOUNTER — Encounter: Payer: Self-pay | Admitting: Infectious Disease

## 2023-09-08 ENCOUNTER — Other Ambulatory Visit: Payer: Self-pay

## 2023-09-08 VITALS — BP 138/96 | HR 94 | Temp 98.0°F | Wt 188.0 lb

## 2023-09-08 DIAGNOSIS — I10 Essential (primary) hypertension: Secondary | ICD-10-CM | POA: Diagnosis not present

## 2023-09-08 DIAGNOSIS — B20 Human immunodeficiency virus [HIV] disease: Secondary | ICD-10-CM | POA: Diagnosis not present

## 2023-09-08 DIAGNOSIS — E785 Hyperlipidemia, unspecified: Secondary | ICD-10-CM

## 2023-09-08 DIAGNOSIS — F419 Anxiety disorder, unspecified: Secondary | ICD-10-CM | POA: Diagnosis not present

## 2023-09-08 DIAGNOSIS — F329 Major depressive disorder, single episode, unspecified: Secondary | ICD-10-CM | POA: Diagnosis not present

## 2023-09-08 DIAGNOSIS — Z09 Encounter for follow-up examination after completed treatment for conditions other than malignant neoplasm: Secondary | ICD-10-CM

## 2023-09-08 DIAGNOSIS — R0789 Other chest pain: Secondary | ICD-10-CM

## 2023-09-08 DIAGNOSIS — G40909 Epilepsy, unspecified, not intractable, without status epilepticus: Secondary | ICD-10-CM

## 2023-09-08 DIAGNOSIS — A539 Syphilis, unspecified: Secondary | ICD-10-CM

## 2023-09-08 DIAGNOSIS — F1010 Alcohol abuse, uncomplicated: Secondary | ICD-10-CM

## 2023-09-08 DIAGNOSIS — R55 Syncope and collapse: Secondary | ICD-10-CM

## 2023-09-08 DIAGNOSIS — G90512 Complex regional pain syndrome I of left upper limb: Secondary | ICD-10-CM

## 2023-09-08 MED ORDER — ROSUVASTATIN CALCIUM 10 MG PO TABS: 10.0000 mg | ORAL_TABLET | Freq: Every day | ORAL | 3 refills | Status: DC

## 2023-09-08 MED ORDER — BIKTARVY 50-200-25 MG PO TABS: 1.0000 | ORAL_TABLET | Freq: Every day | ORAL | 11 refills | Status: DC

## 2023-09-09 ENCOUNTER — Encounter (HOSPITAL_BASED_OUTPATIENT_CLINIC_OR_DEPARTMENT_OTHER): Payer: Medicaid Other | Admitting: Physical Therapy

## 2023-09-09 DIAGNOSIS — F4321 Adjustment disorder with depressed mood: Secondary | ICD-10-CM | POA: Diagnosis not present

## 2023-09-09 DIAGNOSIS — R454 Irritability and anger: Secondary | ICD-10-CM | POA: Diagnosis not present

## 2023-09-09 DIAGNOSIS — F5101 Primary insomnia: Secondary | ICD-10-CM | POA: Diagnosis not present

## 2023-09-13 DIAGNOSIS — Z972 Presence of dental prosthetic device (complete) (partial): Secondary | ICD-10-CM | POA: Insufficient documentation

## 2023-09-14 ENCOUNTER — Ambulatory Visit

## 2023-09-14 ENCOUNTER — Ambulatory Visit (HOSPITAL_BASED_OUTPATIENT_CLINIC_OR_DEPARTMENT_OTHER): Payer: Medicaid Other | Admitting: Physical Therapy

## 2023-09-14 VITALS — BP 128/86 | HR 96 | Ht 72.0 in | Wt 185.8 lb

## 2023-09-14 DIAGNOSIS — Z972 Presence of dental prosthetic device (complete) (partial): Secondary | ICD-10-CM

## 2023-09-14 DIAGNOSIS — I10 Essential (primary) hypertension: Secondary | ICD-10-CM | POA: Diagnosis not present

## 2023-09-14 DIAGNOSIS — R229 Localized swelling, mass and lump, unspecified: Secondary | ICD-10-CM

## 2023-09-14 DIAGNOSIS — R079 Chest pain, unspecified: Secondary | ICD-10-CM

## 2023-09-14 MED ORDER — AMLODIPINE BESYLATE 2.5 MG PO TABS: 2.5000 mg | ORAL_TABLET | Freq: Every day | ORAL | 3 refills | Status: DC
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
## 2023-09-14 NOTE — Assessment & Plan Note (Signed)
 Blood pressure near well-controlled today. Target below 130/80 mmHg. Has longstanding history of hypertension however appears relatively well-controlled on no medicines for the past 1 month. Will start low-dose amlodipine 2.5 mg once daily. Can hold off on metoprolol for now.

## 2023-09-14 NOTE — Patient Instructions (Signed)
 Medication Instructions:  Your physician has recommended you make the following change in your medication:   START: Amlodipine 2.5 mg daily  *If you need a refill on your cardiac medications before your next appointment, please call your pharmacy*   Lab Work: None If you have labs (blood work) drawn today and your tests are completely normal, you will receive your results only by: MyChart Message (if you have MyChart) OR A paper copy in the mail If you have any lab test that is abnormal or we need to change your treatment, we will call you to review the results.   Testing/Procedures: None   Follow-Up: At [facility], you and your health needs are our priority.  As part of our continuing mission to provide you with exceptional heart care, we have created designated Provider Care Teams.  These Care Teams include your primary Cardiologist (physician) and Advanced Practice Providers (APPs -  Physician Assistants and Nurse Practitioners) who all work together to provide you with the care you need, when you need it.  We recommend signing up for the patient portal called "MyChart".  Sign up information is provided on this After Visit Summary.  MyChart is used to connect with patients for Virtual Visits (Telemedicine).  Patients are able to view lab/test results, encounter notes, upcoming appointments, etc.  Non-urgent messages can be sent to your provider as well.   To learn more about what you can do with MyChart, go to [email or web address].    Your next appointment:   Follow up as needed   Provider:   Huntley Dec, MD    Other Instructions None

## 2023-09-14 NOTE — Assessment & Plan Note (Addendum)
 As reviewed about excellent functional status and symptoms of chest discomfort and postictal phase without any significant troponin elevation or EKG changes. At this time no further cardiac workup recommended. Recommended lifestyle and dietary modifications for long-term cardiovascular risk reduction. He is aware to call us with back with any questions or concerns regarding his heart health admitted follow-up with with further evaluation as necessary.  I can see him at [facility] health med Center in [facility] for follow-ups if necessary.

## 2023-09-16 ENCOUNTER — Ambulatory Visit (HOSPITAL_BASED_OUTPATIENT_CLINIC_OR_DEPARTMENT_OTHER): Payer: Medicaid Other | Admitting: Physical Therapy
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
## 2023-09-21 ENCOUNTER — Ambulatory Visit (HOSPITAL_BASED_OUTPATIENT_CLINIC_OR_DEPARTMENT_OTHER): Payer: Medicaid Other | Attending: Nurse Practitioner | Admitting: Physical Therapy

## 2023-09-21 ENCOUNTER — Encounter (HOSPITAL_BASED_OUTPATIENT_CLINIC_OR_DEPARTMENT_OTHER): Payer: Self-pay | Admitting: Physical Therapy

## 2023-09-21 DIAGNOSIS — M6281 Muscle weakness (generalized): Secondary | ICD-10-CM | POA: Diagnosis not present

## 2023-09-21 DIAGNOSIS — M5412 Radiculopathy, cervical region: Secondary | ICD-10-CM | POA: Insufficient documentation

## 2023-09-23 ENCOUNTER — Ambulatory Visit (HOSPITAL_BASED_OUTPATIENT_CLINIC_OR_DEPARTMENT_OTHER): Payer: Medicaid Other | Admitting: Family Medicine

## 2023-09-23 ENCOUNTER — Encounter (HOSPITAL_BASED_OUTPATIENT_CLINIC_OR_DEPARTMENT_OTHER): Payer: Self-pay | Admitting: Family Medicine

## 2023-09-23 ENCOUNTER — Ambulatory Visit (HOSPITAL_BASED_OUTPATIENT_CLINIC_OR_DEPARTMENT_OTHER): Payer: Medicaid Other | Admitting: Physical Therapy

## 2023-09-23 VITALS — BP 128/88 | HR 92 | Ht 72.0 in | Wt 190.6 lb

## 2023-09-23 DIAGNOSIS — F5101 Primary insomnia: Secondary | ICD-10-CM | POA: Diagnosis not present

## 2023-09-23 DIAGNOSIS — R454 Irritability and anger: Secondary | ICD-10-CM | POA: Diagnosis not present

## 2023-09-23 DIAGNOSIS — E785 Hyperlipidemia, unspecified: Secondary | ICD-10-CM

## 2023-09-23 DIAGNOSIS — I10 Essential (primary) hypertension: Secondary | ICD-10-CM | POA: Diagnosis not present

## 2023-09-23 DIAGNOSIS — F4321 Adjustment disorder with depressed mood: Secondary | ICD-10-CM | POA: Diagnosis not present

## 2023-09-23 NOTE — Patient Instructions (Addendum)
  Medication Instructions:  Your physician recommends that you continue on your current medications as directed. Please refer to the Current Medication list given to you today. --If you need a refill on any your medications before your next appointment, please call your pharmacy first. If no refills are authorized on file call the office.-- Lab Work: Your physician has recommended that you have lab work today: labs before visit  If you have labs (blood work) drawn today and your tests are completely normal, you will receive your results via MyChart message OR a phone call from our staff.  Please ensure you check your voicemail in the event that you authorized detailed messages to be left on a delegated number. If you have any lab test that is abnormal or we need to change your treatment, we will call you to review the results.   Follow-Up: Your next appointment:   Your physician recommends that you schedule a follow-up appointment in: 4-6 month physical with Dr. de [place]  You will receive a text message or e-mail with a link to a survey about your care and experience with [place] today! We would greatly appreciate your feedback!   Thanks for letting us be apart of your health journey!!  Primary Care and Sports Medicine   Dr. Ceasar Mons [place]   We encourage you to activate your patient portal called "MyChart".  Sign up information is provided on this After Visit Summary.  MyChart is used to connect with patients for Virtual Visits (Telemedicine).  Patients are able to view lab/test results, encounter notes, upcoming appointments, etc.  Non-urgent messages can be sent to your provider as well. To learn more about what you can do with MyChart, please visit --  [email or web address].

## 2023-09-23 NOTE — Assessment & Plan Note (Signed)
 Started on statin therapy by infectious disease specialist.  Does have lab orders in for repeat cholesterol panel later this year.  No current issues with medication at this time.  Can continue with current regimen.  We will follow-up on cholesterol panel in the future to assess response to statin therapy with adjustment in therapy as needed based on lab findings
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
## 2023-09-23 NOTE — Assessment & Plan Note (Signed)
 Systolic at goal in office today, slightly elevated diastolic reading.  Has been controlled visit with other specialist, particularly with recent visit to cardiology.  At this time, can continue with current medication regimen, no changes today.  Recommend intermittent monitoring of blood pressure at home, DASH diet.

## 2023-09-28 ENCOUNTER — Telehealth (HOSPITAL_BASED_OUTPATIENT_CLINIC_OR_DEPARTMENT_OTHER): Payer: Self-pay | Admitting: Physical Therapy

## 2023-09-28 ENCOUNTER — Ambulatory Visit (HOSPITAL_BASED_OUTPATIENT_CLINIC_OR_DEPARTMENT_OTHER): Payer: Medicaid Other | Admitting: Physical Therapy
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
## 2023-09-28 NOTE — Telephone Encounter (Signed)
 Attempted to call pt in regards to 3rd no-show. It appears that there was a discussion on prior treatment session on holding or discharging PT due to pt's life stressors. Unable to reach pt but left voicemail to call clinic back to discuss plan of care and/or discharge (pt has more appointments scheduled this month).   Perry Norton April Ma Alphonsa Overall, PT

## 2023-09-30 ENCOUNTER — Telehealth (HOSPITAL_BASED_OUTPATIENT_CLINIC_OR_DEPARTMENT_OTHER): Payer: Self-pay | Admitting: Physical Therapy

## 2023-09-30 ENCOUNTER — Ambulatory Visit (HOSPITAL_BASED_OUTPATIENT_CLINIC_OR_DEPARTMENT_OTHER): Payer: Medicaid Other | Admitting: Physical Therapy

## 2023-09-30 ENCOUNTER — Encounter (HOSPITAL_BASED_OUTPATIENT_CLINIC_OR_DEPARTMENT_OTHER): Payer: Self-pay | Admitting: Physical Therapy

## 2023-09-30 DIAGNOSIS — G894 Chronic pain syndrome: Secondary | ICD-10-CM | POA: Diagnosis not present

## 2023-09-30 DIAGNOSIS — Z01818 Encounter for other preprocedural examination: Secondary | ICD-10-CM | POA: Diagnosis not present

## 2023-09-30 NOTE — Therapy (Signed)
 [facility] Outpatient Rehabilitation at [facility] [address] [place], [place], [address] Phone: [phone]   Fax:  [phone]  Patient Details  Name: Perry Norton MRN: [id number] Date of Birth: [birth year] Referring Provider:  No ref. provider found  Encounter Date: 09/30/2023   PHYSICAL THERAPY DISCHARGE SUMMARY  Visits from Start of Care: 5  Current functional level related to goals / functional outcomes: Unknown as patient has not returned. Patient being discharged due to violation of attendance policy with several no show appointments.    Remaining deficits: Unknown as patient has not returned.   Education / Equipment: HEP   Patient agrees to discharge. Patient goals were not met. Patient is being discharged due to not returning since the last visit.   1:37 PM, 09/30/23 Wyman Songster PT, DPT Physical Therapist at [facility] Outpatient Rehabilitation at [facility] [address] [place], [place], [address] Phone: [phone]   Fax:  [phone]

## 2023-09-30 NOTE — Telephone Encounter (Signed)
 Patient no show, left message for patient to make him aware of missed appointment. Patient informed of discharge from PT due to attendance policy and was instructed on getting a new order if he would like to return to PT. Remaining scheduled PT appointments will be cancelled due to noncompliance with attendance.  1:33 PM, 09/30/23 Wyman Songster PT, DPT Physical Therapist at [facility]

## 2023-10-05 ENCOUNTER — Encounter (HOSPITAL_BASED_OUTPATIENT_CLINIC_OR_DEPARTMENT_OTHER): Payer: Medicaid Other | Admitting: Physical Therapy

## 2023-10-07 ENCOUNTER — Encounter (HOSPITAL_BASED_OUTPATIENT_CLINIC_OR_DEPARTMENT_OTHER): Payer: Medicaid Other | Admitting: Physical Therapy

## 2023-10-12 ENCOUNTER — Encounter (HOSPITAL_BASED_OUTPATIENT_CLINIC_OR_DEPARTMENT_OTHER): Payer: Medicaid Other | Admitting: Physical Therapy

## 2023-10-14 ENCOUNTER — Encounter (HOSPITAL_BASED_OUTPATIENT_CLINIC_OR_DEPARTMENT_OTHER): Payer: Medicaid Other | Admitting: Physical Therapy

## 2023-10-25 ENCOUNTER — Encounter (HOSPITAL_BASED_OUTPATIENT_CLINIC_OR_DEPARTMENT_OTHER): Payer: Self-pay | Admitting: Family Medicine

## 2023-10-27 ENCOUNTER — Other Ambulatory Visit (HOSPITAL_BASED_OUTPATIENT_CLINIC_OR_DEPARTMENT_OTHER): Payer: Self-pay | Admitting: *Deleted

## 2023-10-27 DIAGNOSIS — F4321 Adjustment disorder with depressed mood: Secondary | ICD-10-CM | POA: Diagnosis not present

## 2023-10-27 DIAGNOSIS — R454 Irritability and anger: Secondary | ICD-10-CM | POA: Diagnosis not present

## 2023-10-27 DIAGNOSIS — G40909 Epilepsy, unspecified, not intractable, without status epilepticus: Secondary | ICD-10-CM

## 2023-10-27 DIAGNOSIS — F5101 Primary insomnia: Secondary | ICD-10-CM | POA: Diagnosis not present

## 2023-11-01 ENCOUNTER — Other Ambulatory Visit (HOSPITAL_BASED_OUTPATIENT_CLINIC_OR_DEPARTMENT_OTHER): Payer: Self-pay | Admitting: *Deleted

## 2023-11-01 DIAGNOSIS — G40909 Epilepsy, unspecified, not intractable, without status epilepticus: Secondary | ICD-10-CM

## 2023-11-02 DIAGNOSIS — F5101 Primary insomnia: Secondary | ICD-10-CM | POA: Diagnosis not present

## 2023-11-02 DIAGNOSIS — R454 Irritability and anger: Secondary | ICD-10-CM | POA: Diagnosis not present

## 2023-11-02 DIAGNOSIS — F4321 Adjustment disorder with depressed mood: Secondary | ICD-10-CM | POA: Diagnosis not present

## 2023-11-03 NOTE — Progress Notes (Signed)
 The 10-year ASCVD risk score (Arnett DK, et al., 2019) is: 13.2%   Values used to calculate the score:     Age: 48 years     Sex: Male     Is Non-Hispanic African American: Yes     Diabetic: No     Tobacco smoker: Yes     Systolic Blood Pressure: 128 mmHg     Is BP treated: Yes     HDL Cholesterol: 41 mg/dL     Total Cholesterol: 218 mg/dL  Currently prescribed rosuvastatin  10 mg.   Othello Sgroi, BSN, RN

## 2023-11-18 DIAGNOSIS — F4321 Adjustment disorder with depressed mood: Secondary | ICD-10-CM | POA: Diagnosis not present

## 2023-11-18 DIAGNOSIS — F5101 Primary insomnia: Secondary | ICD-10-CM | POA: Diagnosis not present

## 2023-11-24 ENCOUNTER — Ambulatory Visit (HOSPITAL_BASED_OUTPATIENT_CLINIC_OR_DEPARTMENT_OTHER): Admitting: Physical Therapy

## 2023-11-24 DIAGNOSIS — M5412 Radiculopathy, cervical region: Secondary | ICD-10-CM | POA: Diagnosis not present

## 2023-11-24 DIAGNOSIS — M5030 Other cervical disc degeneration, unspecified cervical region: Secondary | ICD-10-CM | POA: Diagnosis not present

## 2023-11-24 DIAGNOSIS — Z981 Arthrodesis status: Secondary | ICD-10-CM | POA: Diagnosis not present

## 2023-11-24 DIAGNOSIS — M5416 Radiculopathy, lumbar region: Secondary | ICD-10-CM | POA: Diagnosis not present

## 2023-11-30 ENCOUNTER — Telehealth (HOSPITAL_BASED_OUTPATIENT_CLINIC_OR_DEPARTMENT_OTHER): Payer: Self-pay | Admitting: *Deleted

## 2023-11-30 ENCOUNTER — Encounter: Payer: Self-pay | Admitting: Infectious Disease

## 2023-11-30 ENCOUNTER — Encounter (HOSPITAL_BASED_OUTPATIENT_CLINIC_OR_DEPARTMENT_OTHER): Payer: Self-pay | Admitting: Family Medicine

## 2023-11-30 ENCOUNTER — Ambulatory Visit (INDEPENDENT_AMBULATORY_CARE_PROVIDER_SITE_OTHER): Admitting: Family Medicine

## 2023-11-30 ENCOUNTER — Ambulatory Visit: Payer: Self-pay

## 2023-11-30 VITALS — BP 123/86 | HR 96 | Ht 72.0 in | Wt 185.2 lb

## 2023-11-30 DIAGNOSIS — F5101 Primary insomnia: Secondary | ICD-10-CM | POA: Diagnosis not present

## 2023-11-30 DIAGNOSIS — F4321 Adjustment disorder with depressed mood: Secondary | ICD-10-CM | POA: Diagnosis not present

## 2023-11-30 DIAGNOSIS — R454 Irritability and anger: Secondary | ICD-10-CM | POA: Diagnosis not present

## 2023-11-30 DIAGNOSIS — Z202 Contact with and (suspected) exposure to infections with a predominantly sexual mode of transmission: Secondary | ICD-10-CM | POA: Diagnosis not present

## 2023-11-30 MED ORDER — DOXYCYCLINE HYCLATE 100 MG PO TABS: 100.0000 mg | ORAL_TABLET | Freq: Two times a day (BID) | ORAL | 0 refills | Status: AC

## 2023-11-30 NOTE — Telephone Encounter (Signed)
 Nurse attempted to return pt's call: no answer: left voicemail requesting pt to call back.
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
## 2023-11-30 NOTE — Progress Notes (Unsigned)
 HPI: Perry Norton is a 48 y.o. male who presents to the [facility] clinic today for STI testing.  Patient Active Problem List   Diagnosis Date Noted   Exposure to syphilis 11/30/2023   Chest pain of uncertain etiology 09/14/2023   Wears dentures    Hyperlipidemia 02/15/2023   Hematochezia 01/26/2023   Complex regional pain syndrome type 1 of left upper extremity 01/15/2023   Seizure-like activity (HCC) 11/02/2022   Healthcare maintenance 08/27/2022   Hospital discharge follow-up 04/15/2022   Seizure disorder (HCC) 04/07/2022   Abdominal pain 04/07/2022   Rhabdomyolysis 04/07/2022   Exposure to body fluids by contaminated hypodermic needle stick 09/23/2021   Sinus pause 09/23/2021   Alcohol abuse 09/22/2021   Vitamin D  deficiency 07/28/2021   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/17/2021   Bilateral shoulder pain 07/11/2021   Subcutaneous mass 03/10/2021   Vertigo 03/07/2021   Spinal stenosis in cervical region 10/31/2020   Behavior concern 09/19/2020   Infected inclusion cyst 09/18/2020   Swelling of first metatarsophalangeal (MTP) joint of right foot 09/18/2020   Screening for STDs (sexually transmitted diseases) 06/27/2020   Essential hypertension 02/23/2020   Marihuana abuse 02/23/2020   Depression, recurrent (HCC) 02/23/2020   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 episode (HCC) 02/23/2020   Concussion with loss of consciousness of 30 minutes or less 02/19/2020   Fever 10/03/2019   Marijuana use 07/12/2019   Concussion 2021   Nerve pain due to spinal stenosis 05/01/2019   Spinal cord compression (HCC) 03/21/2019   Plantar fasciitis 02/12/2019   Generalized seizure disorder (HCC) 11/15/2018   Visual loss 09/07/2018   Diaphoresis 01/19/2018   ED (erectile dysfunction) 01/19/2018   Left arm pain 10/18/2017   Localization-related idiopathic epilepsy and epileptic syndromes with seizures of localized onset, not intractable, without  status epilepticus (HCC) 07/22/2017   Intractable migraine without aura and without status migrainosus 07/22/2017   Chronic pain syndrome 07/22/2017   Pleurisy 07/05/2017   AC separation, type 2, left, initial encounter 05/25/2017   CRI (chronic renal insufficiency) 05/20/2017   Syphilis 05/20/2017   Poor dentition 05/20/2017   Head trauma 06/10/2015   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   BICTEGRAVIR-EMTRICITABINE -TENOFOVIR  AF (BIKTARVY ) 50-200-25 MG TABS TABLET    Take 1 tablet by mouth daily.   DIAZEPAM (VALIUM) 2 MG TABLET    Take 2 mg by mouth every 6 (six) hours as needed for anxiety. Take 1 tablet my mouth twice daily as need for anxiety   DOXYCYCLINE  (VIBRA -TABS) 100 MG TABLET    Take 1 tablet (100 mg total) by mouth 2 (two) times daily for 14 days.   DULOXETINE  (CYMBALTA ) 30 MG CAPSULE    Take 30 mg by mouth daily.   LEVETIRACETAM  (KEPPRA ) 750 MG TABLET    Take 2 tablets (1,500 mg total) by mouth 2 (two) times daily.   ROSUVASTATIN  (CRESTOR ) 10 MG TABLET    Take 1 tablet (10 mg total) by mouth daily.   TRAZODONE (DESYREL) 50 MG TABLET    Take 50-100 mg by mouth  at bedtime.  Modified Medications   No medications on file  Discontinued Medications   No medications on file    Assessment: ***  Plan: - STI screening: HIV antibody, RPR, urine/rectal/pharyngeal GC/CT swabs for cytology today - F/u results to see if treatment is needed  Tolu Zinnia Tindall, PharmD [employer] PGY-1

## 2023-11-30 NOTE — Telephone Encounter (Signed)
 FYI Only or Action Required?: FYI only for provider  Patient was last seen in primary care on 09/23/2023 by de Peru, Alonza Jansky, MD. Called Nurse Triage reporting Exposure to STD. Symptoms began/informed of STED exposure yesterday. Interventions attempted: Nothing. Symptoms are: asymptomatic, sexual intercourse last week with infected partner with Syphilisunchanged.  Triage Disposition: See PCP When Office is Open (Within 3 Days)  Patient/caregiver understands and will follow disposition?: Yes                             Reason for Disposition  Sex partner of someone who was diagnosed with an STI  (Exception: Male exposed to bacterial vaginosis or vaginal yeast infection.)  Answer Assessment - Initial Assessment Questions 1. MAIN CONCERN: "What were you exposed to?"  "What sexually transmitted infection (STI) does your sex partner have?" (e.g., gonorrhea, herpes, HIV, pubic lice)     Syphilis. He states his girlfriend, brother and brother's girlfriends all have it.  2. ROUTE of EXPOSURE: "How were you exposed to the STI?" (e.g., oral, vaginal, or rectal intercourse)     Oral and vaginal.  3. DATE of EXPOSURE: "When did the exposure occur?" (e.g., days)     A week ago.  4. SYMPTOMS: "Do you have any symptoms?" (e.g., pain with urination, rash, sores)     No.  5. PREGNANCY: "Is there any chance you are pregnant?" "When was your last menstrual period?"     N/A.  Patient states he is also concerned because he is supposed to pick up his daughter and her newborn from the hospital today. So he would like to know if he should quarantine. Patient states he has an appointment tomorrow for STD testing. Patient states he is very anxious and worried about the exposure, he states he would prefer to see his PCP and receive the medication for treatment.  Protocols used: STI Exposure-A-AH

## 2023-11-30 NOTE — Telephone Encounter (Signed)
 Summary: patient symptoms fever 107 on Sunday, feeling nausea, 99 temperature on Monday.   Copied From CRM [id number]. Reason for Triage: patient calling in asking for prescription for Doxycycline , patient has been around brother who has Syphilis, patient symptoms fever 107 on Sunday, feeling nausea, 99 temperature on Monday. Patient does not have fever today. Being tested for all STD's on 12/01/23  Patient phone [phone] use this phone number for today only

## 2023-11-30 NOTE — Assessment & Plan Note (Signed)
 Patient reports that he had recent exposure to syphilis by one of his girlfriends.  He reports that with his specific girlfriend, he recalls having intercourse about 3 weeks ago and was notified recently that she was diagnosed with syphilis.  He denies any symptoms currently.  He does note that he has had syphilis in the past.  Reports that he has received prior treatment.  Prior treatment has been with both penicillin as well as doxycycline .  He also reports that his brother was recently told about a girlfriend of his who was also diagnosed with syphilis.  He also indicates that his daughter newborn granddaughter will be at his house and he has concerns that he be exposing them receive treatment promptly. Discussed with patient modes of transmission for syphilis, particularly related to his concerns about family members in the household. Discussed recommended treatment initially for syphilis given his exposure, however patient adamant about not having penicillin shot and wanting to have doxycycline  orally.  Prescription was sent to the pharmacy.  We will complete testing today.  Advised that initial testing can be negative early on and likely will need to have follow-up testing completed.  We discussed completing this follow-up testing, however he indicates that he does have regular follow-up with his infectious disease provider and would prefer to have follow-up with them regarding any additional testing.  Advised that he should contact their office in order to schedule this follow-up and testing

## 2023-11-30 NOTE — Patient Instructions (Signed)
  Medication Instructions:  Your physician recommends that you continue on your current medications as directed. Please refer to the Current Medication list given to you today. --If you need a refill on any your medications before your next appointment, please call your pharmacy first. If no refills are authorized on file call the office.-- Lab Work: Your physician has recommended that you have lab work today: today If you have labs (blood work) drawn today and your tests are completely normal, you will receive your results via MyChart message OR a phone call from our staff.  Please ensure you check your voicemail in the event that you authorized detailed messages to be left on a delegated number. If you have any lab test that is abnormal or we need to change your treatment, we will call you to review the results.   Follow-Up: Your next appointment:   Your physician recommends that you schedule a follow-up appointment in: PRN with Dr. de Peru  You will receive a text message or e-mail with a link to a survey about your care and experience with us  today! We would greatly appreciate your feedback!   Thanks for letting us  be apart of your health journey!!  Primary Care and Sports Medicine   Dr. Court Distance Peru   We encourage you to activate your patient portal called "MyChart".  Sign up information is provided on this After Visit Summary.  MyChart is used to connect with patients for Virtual Visits (Telemedicine).  Patients are able to view lab/test results, encounter notes, upcoming appointments, etc.  Non-urgent messages can be sent to your provider as well. To learn more about what you can do with MyChart, please visit --  [email or web address].

## 2023-11-30 NOTE — Telephone Encounter (Signed)
 Copied from CRM [id number]. Topic: Appointments - Scheduling Inquiry for Clinic >> Nov 30, 2023  1:33 PM Chrystal Crape R wrote: Pt calling to notified office he will be 5 min late, I advised him as long as he's theres within the next 10 min.

## 2023-11-30 NOTE — Progress Notes (Deleted)
 HPI: Perry Norton is a 48 y.o. male who presents to the [facility] clinic today for STI testing.  Patient Active Problem List   Diagnosis Date Noted   Exposure to syphilis 11/30/2023   Chest pain of uncertain etiology 09/14/2023   Wears dentures    Hyperlipidemia 02/15/2023   Hematochezia 01/26/2023   Complex regional pain syndrome type 1 of left upper extremity 01/15/2023   Seizure-like activity (HCC) 11/02/2022   Healthcare maintenance 08/27/2022   Hospital discharge follow-up 04/15/2022   Seizure disorder (HCC) 04/07/2022   Abdominal pain 04/07/2022   Rhabdomyolysis 04/07/2022   Exposure to body fluids by contaminated hypodermic needle stick 09/23/2021   Sinus pause 09/23/2021   Alcohol abuse 09/22/2021   Vitamin D  deficiency 07/28/2021   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/17/2021   Bilateral shoulder pain 07/11/2021   Subcutaneous mass 03/10/2021   Vertigo 03/07/2021   Spinal stenosis in cervical region 10/31/2020   Behavior concern 09/19/2020   Infected inclusion cyst 09/18/2020   Swelling of first metatarsophalangeal (MTP) joint of right foot 09/18/2020   Screening for STDs (sexually transmitted diseases) 06/27/2020   Essential hypertension 02/23/2020   Marihuana abuse 02/23/2020   Depression, recurrent (HCC) 02/23/2020   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 episode (HCC) 02/23/2020   Concussion with loss of consciousness of 30 minutes or less 02/19/2020   Fever 10/03/2019   Marijuana use 07/12/2019   Concussion 2021   Nerve pain due to spinal stenosis 05/01/2019   Spinal cord compression (HCC) 03/21/2019   Plantar fasciitis 02/12/2019   Generalized seizure disorder (HCC) 11/15/2018   Visual loss 09/07/2018   Diaphoresis 01/19/2018   ED (erectile dysfunction) 01/19/2018   Left arm pain 10/18/2017   Localization-related idiopathic epilepsy and epileptic syndromes with seizures of localized onset, not intractable, without  status epilepticus (HCC) 07/22/2017   Intractable migraine without aura and without status migrainosus 07/22/2017   Chronic pain syndrome 07/22/2017   Pleurisy 07/05/2017   AC separation, type 2, left, initial encounter 05/25/2017   CRI (chronic renal insufficiency) 05/20/2017   Syphilis 05/20/2017   Poor dentition 05/20/2017   Head trauma 06/10/2015   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   BICTEGRAVIR-EMTRICITABINE -TENOFOVIR  AF (BIKTARVY ) 50-200-25 MG TABS TABLET    Take 1 tablet by mouth daily.   DIAZEPAM (VALIUM) 2 MG TABLET    Take 2 mg by mouth every 6 (six) hours as needed for anxiety. Take 1 tablet my mouth twice daily as need for anxiety   DOXYCYCLINE  (VIBRA -TABS) 100 MG TABLET    Take 1 tablet (100 mg total) by mouth 2 (two) times daily for 14 days.   DULOXETINE  (CYMBALTA ) 30 MG CAPSULE    Take 30 mg by mouth daily.   LEVETIRACETAM  (KEPPRA ) 750 MG TABLET    Take 2 tablets (1,500 mg total) by mouth 2 (two) times daily.   ROSUVASTATIN  (CRESTOR ) 10 MG TABLET    Take 1 tablet (10 mg total) by mouth daily.   TRAZODONE (DESYREL) 50 MG TABLET    Take 50-100 mg by mouth  at bedtime.  Modified Medications   No medications on file  Discontinued Medications   No medications on file    Assessment: Perry Norton presents today  Seen yesterday by family medicine, given doxy for syphilis.   Plan: - STI screening: HIV antibody, RPR, urine/rectal/pharyngeal GC/CT swabs for cytology today - F/u results to see if treatment is needed  Estela Held, PharmD PGY-2 Infectious Diseases Pharmacy Resident [facility] 11/30/2023 3:28 PM

## 2023-11-30 NOTE — Telephone Encounter (Signed)
 Noted there is 10 minute late policy so will need to be here before then

## 2023-12-01 ENCOUNTER — Other Ambulatory Visit: Payer: Self-pay

## 2023-12-01 ENCOUNTER — Other Ambulatory Visit (HOSPITAL_COMMUNITY)
Admission: RE | Admit: 2023-12-01 | Discharge: 2023-12-01 | Disposition: A | Discharge status: Home / Self Care | Source: Ambulatory Visit | Attending: Infectious Disease | Admitting: Infectious Disease

## 2023-12-01 ENCOUNTER — Ambulatory Visit (INDEPENDENT_AMBULATORY_CARE_PROVIDER_SITE_OTHER): Admitting: Pharmacist

## 2023-12-01 DIAGNOSIS — Z113 Encounter for screening for infections with a predominantly sexual mode of transmission: Secondary | ICD-10-CM | POA: Insufficient documentation

## 2023-12-01 DIAGNOSIS — B20 Human immunodeficiency virus [HIV] disease: Secondary | ICD-10-CM | POA: Insufficient documentation

## 2023-12-01 NOTE — Progress Notes (Signed)
 HPI: Perry Norton is a 48 y.o. male who presents to the [facility] clinic today for STI testing.  Patient Active Problem List   Diagnosis Date Noted   Exposure to syphilis 11/30/2023   Chest pain of uncertain etiology 09/14/2023   Wears dentures    Hyperlipidemia 02/15/2023   Hematochezia 01/26/2023   Complex regional pain syndrome type 1 of left upper extremity 01/15/2023   Seizure-like activity (HCC) 11/02/2022   Healthcare maintenance 08/27/2022   Hospital discharge follow-up 04/15/2022   Seizure disorder (HCC) 04/07/2022   Abdominal pain 04/07/2022   Rhabdomyolysis 04/07/2022   Exposure to body fluids by contaminated hypodermic needle stick 09/23/2021   Sinus pause 09/23/2021   Alcohol abuse 09/22/2021   Vitamin D  deficiency 07/28/2021   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/17/2021   Bilateral shoulder pain 07/11/2021   Subcutaneous mass 03/10/2021   Vertigo 03/07/2021   Spinal stenosis in cervical region 10/31/2020   Behavior concern 09/19/2020   Infected inclusion cyst 09/18/2020   Swelling of first metatarsophalangeal (MTP) joint of right foot 09/18/2020   Screening for STDs (sexually transmitted diseases) 06/27/2020   Essential hypertension 02/23/2020   Marihuana abuse 02/23/2020   Depression, recurrent (HCC) 02/23/2020   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 episode (HCC) 02/23/2020   Concussion with loss of consciousness of 30 minutes or less 02/19/2020   Fever 10/03/2019   Marijuana use 07/12/2019   Concussion 2021   Nerve pain due to spinal stenosis 05/01/2019   Spinal cord compression (HCC) 03/21/2019   Plantar fasciitis 02/12/2019   Generalized seizure disorder (HCC) 11/15/2018   Visual loss 09/07/2018   Diaphoresis 01/19/2018   ED (erectile dysfunction) 01/19/2018   Left arm pain 10/18/2017   Localization-related idiopathic epilepsy and epileptic syndromes with seizures of localized onset, not intractable, without  status epilepticus (HCC) 07/22/2017   Intractable migraine without aura and without status migrainosus 07/22/2017   Chronic pain syndrome 07/22/2017   Pleurisy 07/05/2017   AC separation, type 2, left, initial encounter 05/25/2017   CRI (chronic renal insufficiency) 05/20/2017   Syphilis 05/20/2017   Poor dentition 05/20/2017   Head trauma 06/10/2015   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   BICTEGRAVIR-EMTRICITABINE -TENOFOVIR  AF (BIKTARVY ) 50-200-25 MG TABS TABLET    Take 1 tablet by mouth daily.   DIAZEPAM (VALIUM) 2 MG TABLET    Take 2 mg by mouth every 6 (six) hours as needed for anxiety. Take 1 tablet my mouth twice daily as need for anxiety   DOXYCYCLINE  (VIBRA -TABS) 100 MG TABLET    Take 1 tablet (100 mg total) by mouth 2 (two) times daily for 14 days.   DULOXETINE  (CYMBALTA ) 30 MG CAPSULE    Take 30 mg by mouth daily.   LEVETIRACETAM  (KEPPRA ) 750 MG TABLET    Take 2 tablets (1,500 mg total) by mouth 2 (two) times daily.   ROSUVASTATIN  (CRESTOR ) 10 MG TABLET    Take 1 tablet (10 mg total) by mouth daily.   TRAZODONE (DESYREL) 50 MG TABLET    Take 50-100 mg by mouth  at bedtime.  Modified Medications   No medications on file  Discontinued Medications   No medications on file    Assessment: Perry Norton presents today reporting syphilis exposure. Seen yesterday by family medicine clinic, and provided an appropriate script for doxycycline  100 mg BID x14 days. He requests STI testing here at [facility], as he's not sure that they did full STI panel. I am unable to confirm testing was collected via chart review,  as the orders still appear active. Out of caution, and per patient request, will obtain oral swab for GC/chlamydia, urine test for GC/chlamydia, as well as RPR. He reports filling and starting his doxycycline  yesterday. No concerns regarding the medication.  Plan: - STI screening: RPR, urine/pharyngeal GC/CT swabs for cytology today - F/u results to see if additional treatment is needed - Call with any questions or concerns  Estela Held, PharmD PGY-2 Infectious Diseases Pharmacy Resident [facility] 12/01/2023 4:30 PM

## 2023-12-02 LAB — URINE CYTOLOGY ANCILLARY ONLY
Chlamydia: NEGATIVE
Comment: NEGATIVE
Comment: NORMAL
Neisseria Gonorrhea: NEGATIVE

## 2023-12-02 LAB — CYTOLOGY, (ORAL, ANAL, URETHRAL) ANCILLARY ONLY
Chlamydia: NEGATIVE
Comment: NEGATIVE
Comment: NORMAL
Neisseria Gonorrhea: NEGATIVE

## 2023-12-02 LAB — SYPHILIS: RPR W/REFLEX TO RPR TITER AND TREPONEMAL ANTIBODIES, TRADITIONAL SCREENING AND DIAGNOSIS ALGORITHM: RPR Ser Ql: REACTIVE — AB

## 2023-12-02 LAB — RPR, QUANT+TP ABS (REFLEX)
Rapid Plasma Reagin, Quant: 1:16 {titer} — ABNORMAL HIGH
T Pallidum Abs: REACTIVE — AB

## 2023-12-02 LAB — CHLAMYDIA/GC NAA, CONFIRMATION
Chlamydia trachomatis, NAA: NEGATIVE
Neisseria gonorrhoeae, NAA: NEGATIVE

## 2023-12-04 LAB — RPR: RPR Ser Ql: REACTIVE — AB

## 2023-12-04 LAB — T PALLIDUM AB: T Pallidum Abs: POSITIVE — AB

## 2023-12-04 LAB — RPR TITER: RPR Titer: 1:16 {titer} — ABNORMAL HIGH

## 2023-12-07 ENCOUNTER — Ambulatory Visit (HOSPITAL_BASED_OUTPATIENT_CLINIC_OR_DEPARTMENT_OTHER): Payer: Self-pay | Admitting: Family Medicine

## 2023-12-09 DIAGNOSIS — F4321 Adjustment disorder with depressed mood: Secondary | ICD-10-CM | POA: Diagnosis not present

## 2023-12-09 DIAGNOSIS — R454 Irritability and anger: Secondary | ICD-10-CM | POA: Diagnosis not present

## 2023-12-09 DIAGNOSIS — F5101 Primary insomnia: Secondary | ICD-10-CM | POA: Diagnosis not present

## 2023-12-10 ENCOUNTER — Telehealth: Payer: Self-pay

## 2023-12-10 DIAGNOSIS — M4803 Spinal stenosis, cervicothoracic region: Secondary | ICD-10-CM | POA: Diagnosis not present

## 2023-12-10 DIAGNOSIS — M4802 Spinal stenosis, cervical region: Secondary | ICD-10-CM | POA: Diagnosis not present

## 2023-12-10 DIAGNOSIS — M48061 Spinal stenosis, lumbar region without neurogenic claudication: Secondary | ICD-10-CM | POA: Diagnosis not present

## 2023-12-10 NOTE — Telephone Encounter (Signed)
 Patient called to follow up on results from last week. Notified him that urine and oral swab were negative for gonorrhea and chlamydia.   RPR increased from 1:4 to 1:16, appears primary care has already given him 2 weeks of doxy to treat.   Rockie Schnoor, BSN, RN

## 2023-12-13 DIAGNOSIS — M542 Cervicalgia: Secondary | ICD-10-CM | POA: Diagnosis not present

## 2023-12-13 DIAGNOSIS — M4802 Spinal stenosis, cervical region: Secondary | ICD-10-CM | POA: Diagnosis not present

## 2023-12-13 DIAGNOSIS — M5412 Radiculopathy, cervical region: Secondary | ICD-10-CM | POA: Diagnosis not present

## 2023-12-28 DIAGNOSIS — G40109 Localization-related (focal) (partial) symptomatic epilepsy and epileptic syndromes with simple partial seizures, not intractable, without status epilepticus: Secondary | ICD-10-CM | POA: Diagnosis not present

## 2023-12-30 DIAGNOSIS — R454 Irritability and anger: Secondary | ICD-10-CM | POA: Diagnosis not present

## 2023-12-30 DIAGNOSIS — F4321 Adjustment disorder with depressed mood: Secondary | ICD-10-CM | POA: Diagnosis not present

## 2023-12-30 DIAGNOSIS — F5101 Primary insomnia: Secondary | ICD-10-CM | POA: Diagnosis not present

## 2024-01-02 ENCOUNTER — Encounter: Payer: Self-pay | Admitting: Infectious Disease

## 2024-01-02 DIAGNOSIS — I10 Essential (primary) hypertension: Secondary | ICD-10-CM | POA: Insufficient documentation
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
## 2024-01-03 ENCOUNTER — Other Ambulatory Visit: Payer: Self-pay

## 2024-01-03 ENCOUNTER — Encounter: Payer: Self-pay | Admitting: Infectious Disease

## 2024-01-03 ENCOUNTER — Ambulatory Visit: Admitting: Infectious Disease

## 2024-01-03 VITALS — BP 131/81 | HR 86 | Temp 98.5°F | Wt 187.0 lb

## 2024-01-03 DIAGNOSIS — R569 Unspecified convulsions: Secondary | ICD-10-CM

## 2024-01-03 DIAGNOSIS — B20 Human immunodeficiency virus [HIV] disease: Secondary | ICD-10-CM

## 2024-01-03 DIAGNOSIS — A539 Syphilis, unspecified: Secondary | ICD-10-CM

## 2024-01-03 DIAGNOSIS — I10 Essential (primary) hypertension: Secondary | ICD-10-CM

## 2024-01-03 DIAGNOSIS — G40909 Epilepsy, unspecified, not intractable, without status epilepticus: Secondary | ICD-10-CM | POA: Diagnosis not present

## 2024-01-03 DIAGNOSIS — E785 Hyperlipidemia, unspecified: Secondary | ICD-10-CM

## 2024-01-03 DIAGNOSIS — F1290 Cannabis use, unspecified, uncomplicated: Secondary | ICD-10-CM

## 2024-01-03 MED ORDER — BIKTARVY 50-200-25 MG PO TABS: 1.0000 | ORAL_TABLET | Freq: Every day | ORAL | 11 refills | Status: DC

## 2024-01-03 MED ORDER — DOXYCYCLINE HYCLATE 100 MG PO TABS: 100.0000 mg | ORAL_TABLET | Freq: Two times a day (BID) | ORAL | 1 refills | Status: AC

## 2024-01-03 NOTE — Patient Instructions (Signed)
 Dr Gailen instructions for your anti seizure drugs  He has called in a medicine  Zonisamide  that he would like you to take as directed to get up to 400mg  daily  THEN he wants you to reduce the  Oxycarbazepine to 300mg  twice daily for a week and then STOP it algogether

## 2024-01-05 LAB — HIV-1 RNA QUANT-NO REFLEX-BLD
HIV 1 RNA Quant: NOT DETECTED {copies}/mL
HIV-1 RNA Quant, Log: NOT DETECTED {Log_copies}/mL

## 2024-01-10 DIAGNOSIS — R454 Irritability and anger: Secondary | ICD-10-CM | POA: Diagnosis not present

## 2024-01-10 DIAGNOSIS — F4321 Adjustment disorder with depressed mood: Secondary | ICD-10-CM | POA: Diagnosis not present

## 2024-01-10 DIAGNOSIS — F5101 Primary insomnia: Secondary | ICD-10-CM | POA: Diagnosis not present

## 2024-01-14 DIAGNOSIS — G40109 Localization-related (focal) (partial) symptomatic epilepsy and epileptic syndromes with simple partial seizures, not intractable, without status epilepticus: Secondary | ICD-10-CM | POA: Diagnosis not present

## 2024-02-04 DIAGNOSIS — M5416 Radiculopathy, lumbar region: Secondary | ICD-10-CM | POA: Diagnosis not present

## 2024-02-04 DIAGNOSIS — G90512 Complex regional pain syndrome I of left upper limb: Secondary | ICD-10-CM | POA: Diagnosis not present

## 2024-02-15 DIAGNOSIS — F4321 Adjustment disorder with depressed mood: Secondary | ICD-10-CM | POA: Diagnosis not present

## 2024-02-15 DIAGNOSIS — F5101 Primary insomnia: Secondary | ICD-10-CM | POA: Diagnosis not present

## 2024-02-15 DIAGNOSIS — R454 Irritability and anger: Secondary | ICD-10-CM | POA: Diagnosis not present

## 2024-02-16 DIAGNOSIS — G40109 Localization-related (focal) (partial) symptomatic epilepsy and epileptic syndromes with simple partial seizures, not intractable, without status epilepticus: Secondary | ICD-10-CM | POA: Diagnosis not present

## 2024-02-22 ENCOUNTER — Telehealth: Payer: Self-pay

## 2024-02-22 NOTE — Telephone Encounter (Signed)
 Patient called requesting a call back from Dr. Fleeta Rothman about a conversation they a couple months ago with patient and his wife and wanted to know if it was still available, Didn't now go in to more details on the conversation. Best contact number is [phone]

## 2024-02-22 NOTE — Telephone Encounter (Signed)
 Routing to triage

## 2024-02-23 ENCOUNTER — Telehealth: Payer: Self-pay | Admitting: Infectious Disease

## 2024-02-23 NOTE — Telephone Encounter (Signed)
   I got ahold of him and it is about PrEP  His wife was going to go get an HIV test first but we can just do that in our clinic  Her phone number is   [phone]

## 2024-02-28 DIAGNOSIS — F4321 Adjustment disorder with depressed mood: Secondary | ICD-10-CM | POA: Diagnosis not present

## 2024-02-28 DIAGNOSIS — R454 Irritability and anger: Secondary | ICD-10-CM | POA: Diagnosis not present

## 2024-02-28 DIAGNOSIS — F5101 Primary insomnia: Secondary | ICD-10-CM | POA: Diagnosis not present

## 2024-02-29 ENCOUNTER — Other Ambulatory Visit (HOSPITAL_COMMUNITY)
Admission: RE | Admit: 2024-02-29 | Discharge: 2024-02-29 | Disposition: A | Discharge status: Home / Self Care | Source: Ambulatory Visit | Attending: Infectious Disease | Admitting: Infectious Disease

## 2024-02-29 ENCOUNTER — Other Ambulatory Visit: Payer: Self-pay

## 2024-02-29 ENCOUNTER — Other Ambulatory Visit

## 2024-02-29 DIAGNOSIS — B20 Human immunodeficiency virus [HIV] disease: Secondary | ICD-10-CM | POA: Diagnosis not present

## 2024-03-01 ENCOUNTER — Other Ambulatory Visit: Payer: Self-pay

## 2024-03-01 LAB — URINE CYTOLOGY ANCILLARY ONLY
Chlamydia: NEGATIVE
Comment: NEGATIVE
Comment: NORMAL
Neisseria Gonorrhea: NEGATIVE

## 2024-03-01 LAB — T-HELPER CELL (CD4) - (RCID CLINIC ONLY)
CD4 % Helper T Cell: 35 % (ref 33–65)
CD4 T Cell Abs: 854 /uL (ref 400–1790)

## 2024-03-04 LAB — CBC WITH DIFFERENTIAL/PLATELET
Absolute Lymphocytes: 2610 {cells}/uL (ref 850–3900)
Absolute Monocytes: 498 {cells}/uL (ref 200–950)
Basophils Absolute: 60 {cells}/uL (ref 0–200)
Basophils Relative: 1 %
Eosinophils Absolute: 48 {cells}/uL (ref 15–500)
Eosinophils Relative: 0.8 %
HCT: 41.2 % (ref 38.5–50.0)
Hemoglobin: 13.9 g/dL (ref 13.2–17.1)
MCH: 30.5 pg (ref 27.0–33.0)
MCHC: 33.7 g/dL (ref 32.0–36.0)
MCV: 90.5 fL (ref 80.0–100.0)
MPV: 10.9 fL (ref 7.5–12.5)
Monocytes Relative: 8.3 %
Neutro Abs: 2784 {cells}/uL (ref 1500–7800)
Neutrophils Relative %: 46.4 %
Platelets: 222 Thousand/uL (ref 140–400)
RBC: 4.55 Million/uL (ref 4.20–5.80)
RDW: 13.4 % (ref 11.0–15.0)
Total Lymphocyte: 43.5 %
WBC: 6 Thousand/uL (ref 3.8–10.8)

## 2024-03-04 LAB — COMPLETE METABOLIC PANEL WITHOUT GFR
AG Ratio: 2 (calc) (ref 1.0–2.5)
ALT: 12 U/L (ref 9–46)
AST: 15 U/L (ref 10–40)
Albumin: 4.5 g/dL (ref 3.6–5.1)
Alkaline phosphatase (APISO): 68 U/L (ref 36–130)
BUN/Creatinine Ratio: 8 (calc) (ref 6–22)
BUN: 11 mg/dL (ref 7–25)
CO2: 30 mmol/L (ref 20–32)
Calcium: 9.5 mg/dL (ref 8.6–10.3)
Chloride: 104 mmol/L (ref 98–110)
Creat: 1.39 mg/dL — ABNORMAL HIGH (ref 0.60–1.29)
Globulin: 2.3 g/dL (ref 1.9–3.7)
Glucose, Bld: 98 mg/dL (ref 65–99)
Potassium: 3.4 mmol/L — ABNORMAL LOW (ref 3.5–5.3)
Sodium: 141 mmol/L (ref 135–146)
Total Bilirubin: 0.4 mg/dL (ref 0.2–1.2)
Total Protein: 6.8 g/dL (ref 6.1–8.1)

## 2024-03-04 LAB — LIPID PANEL
Cholesterol: 189 mg/dL (ref <200)
HDL: 38 mg/dL — ABNORMAL LOW (ref >=40)
LDL Cholesterol (Calc): 124 mg/dL — ABNORMAL HIGH
Non-HDL Cholesterol (Calc): 151 mg/dL — ABNORMAL HIGH (ref <130)
Total CHOL/HDL Ratio: 5 (calc) — ABNORMAL HIGH (ref <5.0)
Triglycerides: 153 mg/dL — ABNORMAL HIGH (ref <150)

## 2024-03-04 LAB — RPR TITER: RPR Titer: 1:16 {titer} — ABNORMAL HIGH

## 2024-03-04 LAB — T PALLIDUM AB: T Pallidum Abs: POSITIVE — AB

## 2024-03-04 LAB — RPR: RPR Ser Ql: REACTIVE — AB

## 2024-03-04 LAB — HIV-1 RNA QUANT-NO REFLEX-BLD
HIV 1 RNA Quant: <20 {copies}/mL — AB
HIV-1 RNA Quant, Log: <1.3 {Log_copies}/mL — AB

## 2024-03-25 ENCOUNTER — Encounter: Payer: Self-pay | Admitting: Infectious Disease

## 2024-03-25 DIAGNOSIS — B20 Human immunodeficiency virus [HIV] disease: Secondary | ICD-10-CM | POA: Insufficient documentation
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
## 2024-03-27 ENCOUNTER — Ambulatory Visit: Payer: Self-pay | Admitting: Infectious Disease

## 2024-03-27 ENCOUNTER — Encounter (HOSPITAL_BASED_OUTPATIENT_CLINIC_OR_DEPARTMENT_OTHER): Admitting: Family Medicine

## 2024-03-27 ENCOUNTER — Other Ambulatory Visit: Payer: Self-pay

## 2024-03-27 ENCOUNTER — Encounter: Payer: Self-pay | Admitting: Infectious Disease

## 2024-03-27 VITALS — BP 125/95 | HR 121 | Temp 97.9°F | Resp 16 | Wt 181.6 lb

## 2024-03-27 DIAGNOSIS — Z7185 Encounter for immunization safety counseling: Secondary | ICD-10-CM

## 2024-03-27 DIAGNOSIS — I10 Essential (primary) hypertension: Secondary | ICD-10-CM

## 2024-03-27 DIAGNOSIS — F321 Major depressive disorder, single episode, moderate: Secondary | ICD-10-CM

## 2024-03-27 DIAGNOSIS — G40909 Epilepsy, unspecified, not intractable, without status epilepticus: Secondary | ICD-10-CM

## 2024-03-27 DIAGNOSIS — Z7901 Long term (current) use of anticoagulants: Secondary | ICD-10-CM

## 2024-03-27 DIAGNOSIS — B20 Human immunodeficiency virus [HIV] disease: Secondary | ICD-10-CM | POA: Diagnosis not present

## 2024-03-27 DIAGNOSIS — E785 Hyperlipidemia, unspecified: Secondary | ICD-10-CM

## 2024-03-27 DIAGNOSIS — N182 Chronic kidney disease, stage 2 (mild): Secondary | ICD-10-CM

## 2024-03-27 DIAGNOSIS — F1010 Alcohol abuse, uncomplicated: Secondary | ICD-10-CM

## 2024-03-27 DIAGNOSIS — Z23 Encounter for immunization: Secondary | ICD-10-CM

## 2024-03-27 MED ORDER — BIKTARVY 50-200-25 MG PO TABS: 1.0000 | ORAL_TABLET | Freq: Every day | ORAL | 11 refills | Status: AC

## 2024-03-27 MED ORDER — ROSUVASTATIN CALCIUM 10 MG PO TABS: 10.0000 mg | ORAL_TABLET | Freq: Every day | ORAL | 3 refills | Status: AC

## 2024-03-27 NOTE — Patient Instructions (Addendum)
 Meds for women for PrEP include  Oral daily Truvada  Once every 2 months IM Apretude injections  Once every 6 month Yeztugo injections  [facility]  [facility] number   [phone]  [facility]:  [phone]   [facility] also does PrEP for uninsured   Phone [phone]

## 2024-03-29 DIAGNOSIS — G40109 Localization-related (focal) (partial) symptomatic epilepsy and epileptic syndromes with simple partial seizures, not intractable, without status epilepticus: Secondary | ICD-10-CM | POA: Diagnosis not present

## 2024-04-04 DIAGNOSIS — G40109 Localization-related (focal) (partial) symptomatic epilepsy and epileptic syndromes with simple partial seizures, not intractable, without status epilepticus: Secondary | ICD-10-CM | POA: Diagnosis not present

## 2024-04-13 DIAGNOSIS — R454 Irritability and anger: Secondary | ICD-10-CM | POA: Diagnosis not present

## 2024-04-13 DIAGNOSIS — F5101 Primary insomnia: Secondary | ICD-10-CM | POA: Diagnosis not present

## 2024-04-13 DIAGNOSIS — F4321 Adjustment disorder with depressed mood: Secondary | ICD-10-CM | POA: Diagnosis not present

## 2024-04-14 ENCOUNTER — Telehealth: Payer: Self-pay

## 2024-04-14 DIAGNOSIS — E785 Hyperlipidemia, unspecified: Secondary | ICD-10-CM

## 2024-04-14 NOTE — Progress Notes (Signed)
 Complex Care Management Note Care Guide Note  04/14/2024 Name: Perry Norton MRN: [id number] DOB: [birth year]   Complex Care Management Outreach Attempts: An unsuccessful telephone outreach was attempted today to offer the patient information about available complex care management services.  Follow Up Plan:  Additional outreach attempts will be made to offer the patient complex care management information and services.   Encounter Outcome:  No Answer  Dream[facility]  [facility], [facility] VBCI Assistant Direct Dial: [phone]  Fax: [phone]

## 2024-04-19 NOTE — Progress Notes (Unsigned)
 Complex Care Management Note Care Guide Note  04/19/2024 Name: Perry Norton MRN: [id number] DOB: [birth year]   Complex Care Management Outreach Attempts: A second unsuccessful outreach was attempted today to offer the patient with information about available complex care management services.  Follow Up Plan:  Additional outreach attempts will be made to offer the patient complex care management information and services.   Encounter Outcome:  No Answer  Dreama [facility]  [facility], [facility] VBCI Assistant Direct Dial: [phone]  Fax: [phone]

## 2024-04-20 NOTE — Progress Notes (Signed)
 Complex Care Management Note  Care Guide Note 04/20/2024 Name: Perry Norton MRN: [id number] DOB: 1976-02-18  Perry Norton is a 48 y.o. year old male who sees [place], Quintin PARAS, MD for primary care. I reached out to Camellia Mcburney by phone today to offer complex care management services.  Mr. Hafley was given information about Complex Care Management services today including:   The Complex Care Management services include support from the care team which includes your Nurse Care Manager, Clinical Social Worker, or Pharmacist.  The Complex Care Management team is here to help remove barriers to the health concerns and goals most important to you. Complex Care Management services are voluntary, and the patient may decline or stop services at any time by request to their care team member.   Complex Care Management Consent Status: Patient did not agree to participate in complex care management services at this time.  Encounter Outcome:  Patient Refused  Dreama Agent [facility], [facility] VBCI Assistant Direct Dial: [phone]  Fax: [phone]

## 2024-05-02 DIAGNOSIS — R454 Irritability and anger: Secondary | ICD-10-CM | POA: Diagnosis not present

## 2024-05-02 DIAGNOSIS — F5101 Primary insomnia: Secondary | ICD-10-CM | POA: Diagnosis not present

## 2024-05-02 DIAGNOSIS — F4321 Adjustment disorder with depressed mood: Secondary | ICD-10-CM | POA: Diagnosis not present

## 2024-05-17 DIAGNOSIS — F4321 Adjustment disorder with depressed mood: Secondary | ICD-10-CM | POA: Diagnosis not present

## 2024-05-17 DIAGNOSIS — R454 Irritability and anger: Secondary | ICD-10-CM | POA: Diagnosis not present

## 2024-05-17 DIAGNOSIS — F5101 Primary insomnia: Secondary | ICD-10-CM | POA: Diagnosis not present

## 2024-05-22 DIAGNOSIS — G40109 Localization-related (focal) (partial) symptomatic epilepsy and epileptic syndromes with simple partial seizures, not intractable, without status epilepticus: Secondary | ICD-10-CM | POA: Diagnosis not present

## 2024-06-19 DIAGNOSIS — R454 Irritability and anger: Secondary | ICD-10-CM | POA: Diagnosis not present

## 2024-06-19 DIAGNOSIS — F5101 Primary insomnia: Secondary | ICD-10-CM | POA: Diagnosis not present

## 2024-06-19 DIAGNOSIS — F4321 Adjustment disorder with depressed mood: Secondary | ICD-10-CM | POA: Diagnosis not present

## 2024-08-21 ENCOUNTER — Ambulatory Visit: Payer: Self-pay | Admitting: Infectious Disease
